# Patient Record
Sex: Female | Born: 1965
Health system: Southern US, Community
[De-identification: ages and names within clinical notes are randomized; demographics above are authoritative.]

## PROBLEM LIST (undated history)

## (undated) DIAGNOSIS — T7840XA Allergy, unspecified, initial encounter: Secondary | ICD-10-CM

## (undated) DIAGNOSIS — Z872 Personal history of diseases of the skin and subcutaneous tissue: Secondary | ICD-10-CM

## (undated) DIAGNOSIS — E785 Hyperlipidemia, unspecified: Secondary | ICD-10-CM

## (undated) DIAGNOSIS — E119 Type 2 diabetes mellitus without complications: Secondary | ICD-10-CM

## (undated) DIAGNOSIS — G4730 Sleep apnea, unspecified: Secondary | ICD-10-CM

## (undated) DIAGNOSIS — L930 Discoid lupus erythematosus: Secondary | ICD-10-CM

## (undated) DIAGNOSIS — E669 Obesity, unspecified: Secondary | ICD-10-CM

## (undated) DIAGNOSIS — R7303 Prediabetes: Secondary | ICD-10-CM

## (undated) DIAGNOSIS — R600 Localized edema: Secondary | ICD-10-CM

## (undated) DIAGNOSIS — Z800 Family history of malignant neoplasm of digestive organs: Secondary | ICD-10-CM

## (undated) DIAGNOSIS — T8859XA Other complications of anesthesia, initial encounter: Secondary | ICD-10-CM

## (undated) DIAGNOSIS — J342 Deviated nasal septum: Secondary | ICD-10-CM

## (undated) DIAGNOSIS — G2581 Restless legs syndrome: Secondary | ICD-10-CM

## (undated) DIAGNOSIS — Z8042 Family history of malignant neoplasm of prostate: Secondary | ICD-10-CM

## (undated) DIAGNOSIS — M549 Dorsalgia, unspecified: Secondary | ICD-10-CM

## (undated) DIAGNOSIS — Z862 Personal history of diseases of the blood and blood-forming organs and certain disorders involving the immune mechanism: Secondary | ICD-10-CM

## (undated) DIAGNOSIS — G4733 Obstructive sleep apnea (adult) (pediatric): Secondary | ICD-10-CM

## (undated) HISTORY — DX: Allergy, unspecified, initial encounter: T78.40XA

## (undated) HISTORY — DX: Personal history of diseases of the blood and blood-forming organs and certain disorders involving the immune mechanism: Z86.2

## (undated) HISTORY — DX: Type 2 diabetes mellitus without complications: E11.9

## (undated) HISTORY — DX: Prediabetes: R73.03

## (undated) HISTORY — DX: Obstructive sleep apnea (adult) (pediatric): G47.33

## (undated) HISTORY — DX: Family history of malignant neoplasm of prostate: Z80.42

## (undated) HISTORY — DX: Deviated nasal septum: J34.2

## (undated) HISTORY — DX: Dorsalgia, unspecified: M54.9

## (undated) HISTORY — DX: Restless legs syndrome: G25.81

## (undated) HISTORY — DX: Obesity, unspecified: E66.9

## (undated) HISTORY — DX: Hyperlipidemia, unspecified: E78.5

## (undated) HISTORY — DX: Family history of malignant neoplasm of digestive organs: Z80.0

## (undated) HISTORY — DX: Localized edema: R60.0

## (undated) HISTORY — PX: ROTATOR CUFF REPAIR: SHX139

## (undated) HISTORY — DX: Sleep apnea, unspecified: G47.30

## (undated) HISTORY — DX: Personal history of diseases of the skin and subcutaneous tissue: Z87.2

## (undated) HISTORY — DX: Discoid lupus erythematosus: L93.0

---

## 2005-08-08 ENCOUNTER — Emergency Department (HOSPITAL_COMMUNITY): Admission: EM | Admit: 2005-08-08 | Discharge: 2005-08-08 | Payer: Self-pay | Admitting: Family Medicine

## 2006-07-15 ENCOUNTER — Emergency Department (HOSPITAL_COMMUNITY): Admission: EM | Admit: 2006-07-15 | Discharge: 2006-07-15 | Payer: Self-pay | Admitting: Family Medicine

## 2006-10-02 ENCOUNTER — Emergency Department (HOSPITAL_COMMUNITY): Admission: EM | Admit: 2006-10-02 | Discharge: 2006-10-02 | Payer: Self-pay | Admitting: Family Medicine

## 2007-08-28 HISTORY — PX: ROTATOR CUFF REPAIR: SHX139

## 2008-03-27 HISTORY — PX: OTHER SURGICAL HISTORY: SHX169

## 2008-04-09 ENCOUNTER — Other Ambulatory Visit: Admission: RE | Admit: 2008-04-09 | Discharge: 2008-04-09 | Payer: Self-pay | Admitting: Internal Medicine

## 2008-04-29 ENCOUNTER — Ambulatory Visit (HOSPITAL_COMMUNITY): Admission: RE | Admit: 2008-04-29 | Discharge: 2008-04-29 | Payer: Self-pay | Admitting: Internal Medicine

## 2008-05-23 ENCOUNTER — Ambulatory Visit (HOSPITAL_COMMUNITY): Admission: RE | Admit: 2008-05-23 | Discharge: 2008-05-23 | Payer: Self-pay | Admitting: Orthopedic Surgery

## 2008-08-12 ENCOUNTER — Ambulatory Visit: Payer: Self-pay | Admitting: Internal Medicine

## 2008-09-14 ENCOUNTER — Ambulatory Visit (HOSPITAL_BASED_OUTPATIENT_CLINIC_OR_DEPARTMENT_OTHER): Admission: RE | Admit: 2008-09-14 | Discharge: 2008-09-15 | Payer: Self-pay | Admitting: Orthopedic Surgery

## 2008-11-22 ENCOUNTER — Ambulatory Visit: Payer: Self-pay | Admitting: Internal Medicine

## 2008-11-28 ENCOUNTER — Emergency Department (HOSPITAL_COMMUNITY): Admission: EM | Admit: 2008-11-28 | Discharge: 2008-11-28 | Payer: Self-pay | Admitting: Family Medicine

## 2009-04-14 ENCOUNTER — Other Ambulatory Visit: Admission: RE | Admit: 2009-04-14 | Discharge: 2009-04-14 | Payer: Self-pay | Admitting: Internal Medicine

## 2009-04-14 ENCOUNTER — Ambulatory Visit: Payer: Self-pay | Admitting: Internal Medicine

## 2009-09-26 ENCOUNTER — Ambulatory Visit (HOSPITAL_COMMUNITY): Admission: RE | Admit: 2009-09-26 | Discharge: 2009-09-26 | Payer: Self-pay | Admitting: Internal Medicine

## 2009-10-20 DIAGNOSIS — E785 Hyperlipidemia, unspecified: Secondary | ICD-10-CM | POA: Insufficient documentation

## 2009-10-20 DIAGNOSIS — E559 Vitamin D deficiency, unspecified: Secondary | ICD-10-CM | POA: Insufficient documentation

## 2009-10-20 DIAGNOSIS — G2581 Restless legs syndrome: Secondary | ICD-10-CM | POA: Insufficient documentation

## 2009-10-21 ENCOUNTER — Ambulatory Visit: Payer: Self-pay | Admitting: Pulmonary Disease

## 2009-10-21 DIAGNOSIS — J309 Allergic rhinitis, unspecified: Secondary | ICD-10-CM | POA: Insufficient documentation

## 2009-10-21 DIAGNOSIS — G4710 Hypersomnia, unspecified: Secondary | ICD-10-CM | POA: Insufficient documentation

## 2009-10-21 DIAGNOSIS — L930 Discoid lupus erythematosus: Secondary | ICD-10-CM | POA: Insufficient documentation

## 2009-10-28 ENCOUNTER — Ambulatory Visit: Payer: Self-pay | Admitting: Pulmonary Disease

## 2009-11-03 DIAGNOSIS — G4733 Obstructive sleep apnea (adult) (pediatric): Secondary | ICD-10-CM | POA: Insufficient documentation

## 2009-12-16 ENCOUNTER — Ambulatory Visit (HOSPITAL_BASED_OUTPATIENT_CLINIC_OR_DEPARTMENT_OTHER): Admission: RE | Admit: 2009-12-16 | Discharge: 2009-12-16 | Payer: Self-pay | Admitting: Pulmonary Disease

## 2009-12-16 ENCOUNTER — Encounter: Payer: Self-pay | Admitting: Pulmonary Disease

## 2009-12-24 ENCOUNTER — Ambulatory Visit: Payer: Self-pay | Admitting: Pulmonary Disease

## 2009-12-26 ENCOUNTER — Telehealth (INDEPENDENT_AMBULATORY_CARE_PROVIDER_SITE_OTHER): Payer: Self-pay | Admitting: *Deleted

## 2009-12-26 ENCOUNTER — Ambulatory Visit: Payer: Self-pay | Admitting: Pulmonary Disease

## 2010-04-20 ENCOUNTER — Ambulatory Visit: Payer: Self-pay | Admitting: Internal Medicine

## 2010-04-20 ENCOUNTER — Other Ambulatory Visit: Admission: RE | Admit: 2010-04-20 | Discharge: 2010-04-20 | Payer: Self-pay | Admitting: Internal Medicine

## 2010-09-18 ENCOUNTER — Encounter: Payer: Self-pay | Admitting: Orthopedic Surgery

## 2010-09-26 NOTE — Progress Notes (Signed)
----   Converted from flag ---- ---- 12/26/2009 9:16 AM, Arman Filter LPN wrote: pt needs ov with kc to discuss sleep study results. ------------------------------  called and spoke with pt.  pt scheduled to see [facility] today at 3:30pm.

## 2010-09-26 NOTE — Assessment & Plan Note (Signed)
Summary: ov to discuss sleep study results/mg   Copy to:  Marlan Palau Primary Provider/Referring Provider:  Marlan Palau  CC:  Pt is here for a f/u appt to discuss sleep study results. .  History of Present Illness: The pt comes in today for f/u of her recent sleep study.  She was found to have severe osa, with AHI of 44/hr and desat to 86%.  I have gone over the study in detail with her, and answered all of her questions.  Medications Prior to Update: 1)  Lipitor 10 Mg Tabs (Atorvastatin Calcium) .... Take 1 Tablet By Mouth Once A Day 2)  Allegra-D 24 Hour 180-240 Mg Xr24h-Tab (Fexofenadine-Pseudoephedrine) .... Take 1 Tablet By Mouth Once A Day 3)  Requip 0.5 Mg Tabs (Ropinirole Hcl) .... Take 1 To 2 Tabs By Mouth At Bedtime  Allergies (verified): 1)  ! Sulfa  Vital Signs:  Patient profile:   45 year old female Height:      59 inches Weight:      206.25 pounds BMI:     41.81 O2 Sat:      97 % on Room air Temp:     98.7 degrees F oral Pulse rate:   112 / minute BP sitting:   120 / 86  (left arm) Cuff size:   large  Vitals Entered By: Arman Filter LPN (Dec 26, 1189 3:35 PM)  O2 Flow:  Room air CC: Pt is here for a f/u appt to discuss sleep study results.  Comments Medications reviewed with patient Arman Filter LPN  Dec 26, 4780 3:35 PM    Physical Exam  General:  obese female in nad   Impression & Recommendations:  Problem # 1:  OBSTRUCTIVE SLEEP APNEA (ICD-327.23) the pt has severe osa by her recent sleep study, and will need aggressive treatment.  CPAP and weight loss are her best treatment options, and the pt is willing to try.  Will start her on a medium pressure level to allow for desensitization, and then will optimize with auto mode at home.  I have discussed with her the varioius mask choices, and also how to use the heated humidifier.  She will follow up in 4-5 weeks to check on her progress.  Time spent with pt today was .  Other Orders: Est.  Patient Level III (95621) DME Referral (DME)  Patient Instructions: 1)  will set up on cpap.  please call me if any tolerance issues 2)  work on weight loss 3)  followup with me in 5 weeks.   Immunization History:  Influenza Immunization History:    Influenza:  historical (05/27/2009)  Pneumovax Immunization History:    Pneumovax:  n/a (12/26/2009)

## 2010-09-26 NOTE — Assessment & Plan Note (Signed)
Summary: consult for hypersomnia   Copy to:  Marlan Palau Primary Provider/Referring Provider:  Marlan Palau  CC:  Sleep Consult.  History of Present Illness: The pt is a 45y/o female who I have been asked to see for possible osa.  The pt does not have a bed partner, but her family has noted "whistling" whenever she sleeps.  She does not think she snores, and denies choking arousals.  She goes to bed at 10:30 pm, and arises at 6:20am to start her day.   She is a restless sleeper, and does not feel refreshed the next day.  She may have decreased concentration and alertness at work, but denies sleepiness.  She denies dozing with tv or movies, but does feel she is abnormally sleepy on weekends if she does inactive things.  She also notes some sleep pressure with driving long distances.  Her weight is up 20 pounds over the last 2 years, but her epworth is only 2 today.  She does have a h/o RLS for which she takes requip, but feels she is not controlled even on medication.  She describes unusual sensations in her LE's during day and evening, but what is unusual is that it does not improve with movement/walking.  She does not know if she has kicking during the night.  She doesn't know if she has ever had an iron panel.    Preventive Screening-Counseling & Management  Alcohol-Tobacco     Smoking Status: quit  Current Medications (verified): 1)  Lipitor 10 Mg Tabs (Atorvastatin Calcium) .... Take 1 Tablet By Mouth Once A Day 2)  Allegra-D 24 Hour 180-240 Mg Xr24h-Tab (Fexofenadine-Pseudoephedrine) .... Take 1 Tablet By Mouth Once A Day 3)  Requip 0.5 Mg Tabs (Ropinirole Hcl) .... Take 1 To 2 Tabs By Mouth At Bedtime  Allergies (verified): 1)  ! 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 heart disease: father (bypass) clotting disorders: father (stroke) father: HTN, DM mother: hyperlipidemia 2 sisters: DM Osa in multiple family members.  Social History: Reviewed history and no changes required. Patient states former smoker.  started at age 53.  1 ppd.  quit 2005. pt is single. pt has no children. 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  Smoking Status:  quit  Review of Systems       The patient complains of nasal congestion/difficulty breathing through nose and hand/feet swelling.  The patient denies shortness of breath with activity, shortness of breath at rest, productive cough, non-productive cough, coughing up blood, chest pain, irregular heartbeats, acid heartburn, indigestion, loss of appetite, weight change, abdominal pain, difficulty swallowing, sore throat, tooth/dental problems, headaches, sneezing, itching, ear ache, anxiety, depression, joint stiffness or pain, rash, change in color of mucus, and fever.    Vital Signs:  Patient profile:   45 year old female Height:      59 inches Weight:      204.38 pounds BMI:     41.43 O2 Sat:      97 % on Room air Temp:     99.1 degrees F oral Pulse rate:   122 / minute BP sitting:   120 / 92  (left arm) Cuff size:   regular  Vitals Entered By: Arman Filter LPN (October 21, 2009 2:13 PM)  O2 Flow:  Room air CC: Sleep Consult Comments Medications reviewed with patient Arman Filter LPN  October 21, 2009 2:20 PM    Physical Exam  General:  ow female in nad Eyes:  PERRLA and EOMI.   Nose:  mild septal deviation Mouth:  moderate elongation of soft palate and uvula Neck:  no jvd, tmg, LN Lungs:  clear to auscultation Heart:  rrr, no mrg Abdomen:  soft and nontender, bs+ Extremities:  no edema noted, pulses intact distally no cyanosis Neurologic:  alert and oriented, moves all 4.   Impression &  Recommendations:  Problem # 1:  HYPERSOMNIA (ICD-780.54) the pt's history is somewhat suggestive of osa, but not overwhelmingly so.  She does have unrefreshed sleep at times, along with varying degrees of sleep pressure.  She is at risk for osa with her obesity, family history, and abnormal upper airway anatomy.  Part of the problem is she sleeps alone, with no one to witness abnormal breathing patterns or whether she may have excessive kicking.  It may be that her RLS? is affecting her sleep more than anything else, and she has minimal sleep disordered breathing?  At this point, because my suspicion for osa is not overly high, will do home screening study.  If she has very little sleep apnea, will look at treating RLS more aggessively.  Will see if Dr. Lenord Fellers has an iron panel on this patient, since deficiency can cause RLS.    Medications Added to Medication List This Visit: 1)  Allegra-d 24 Hour 180-240 Mg Xr24h-tab (Fexofenadine-pseudoephedrine) .... Take 1 tablet by mouth once a day  Other Orders: Consultation Level IV (23557) Misc. Referral (Misc. Ref)  Patient Instructions: 1)  will check screening study at home to evaluate for sleep apnea.  I will call with results 2)  work on weight loss.  Appended Document: consult for hypersomnia megan, please see if Dr. Lenord Fellers has an serum iron panel on this patient. thanks.  Appended Document: consult for hypersomnia called and spoke with Dr. Beryle Quant office.  No serum iron panel drawn on this pt per their records.   Appended Document: consult for hypersomnia noted.

## 2010-09-26 NOTE — Assessment & Plan Note (Signed)
Summary: apnea link shows severe osa   Copy to:  Marlan Palau Primary Provider/Referring Provider:  Marlan Palau   History of Present Illness: apnea link shows severe osa with AHI 47/hr, and desat to 74%.  the pt will need formal sleep testing for evaluation and treatment.  Allergies: 1)  ! Sulfa   Other Orders: No Charge Patient Arrived (NCPA0) (NCPA0) Sleep Disorder Referral (Sleep Disorder)  Patient Instructions: 1)  will schedule for formal sleep study

## 2010-10-31 ENCOUNTER — Ambulatory Visit (INDEPENDENT_AMBULATORY_CARE_PROVIDER_SITE_OTHER): Payer: Self-pay | Admitting: Internal Medicine

## 2010-10-31 DIAGNOSIS — J310 Chronic rhinitis: Secondary | ICD-10-CM

## 2010-10-31 DIAGNOSIS — E785 Hyperlipidemia, unspecified: Secondary | ICD-10-CM

## 2010-11-13 ENCOUNTER — Encounter: Payer: Self-pay | Admitting: *Deleted

## 2010-12-17 ENCOUNTER — Inpatient Hospital Stay (INDEPENDENT_AMBULATORY_CARE_PROVIDER_SITE_OTHER)
Admission: RE | Admit: 2010-12-17 | Discharge: 2010-12-17 | Disposition: A | Discharge status: Home / Self Care | Payer: 59 | Source: Ambulatory Visit | Attending: Family Medicine | Admitting: Family Medicine

## 2010-12-17 DIAGNOSIS — S91109A Unspecified open wound of unspecified toe(s) without damage to nail, initial encounter: Secondary | ICD-10-CM

## 2010-12-28 ENCOUNTER — Ambulatory Visit (INDEPENDENT_AMBULATORY_CARE_PROVIDER_SITE_OTHER): Payer: 59 | Admitting: Internal Medicine

## 2010-12-28 DIAGNOSIS — S91109A Unspecified open wound of unspecified toe(s) without damage to nail, initial encounter: Secondary | ICD-10-CM

## 2011-01-01 ENCOUNTER — Ambulatory Visit (INDEPENDENT_AMBULATORY_CARE_PROVIDER_SITE_OTHER): Payer: 59 | Admitting: Internal Medicine

## 2011-01-01 DIAGNOSIS — S91309A Unspecified open wound, unspecified foot, initial encounter: Secondary | ICD-10-CM

## 2011-01-09 NOTE — Op Note (Signed)
Connie [name], [name]              ACCOUNT NO.:  [id number]   MEDICAL RECORD NO.:  [id number]          PATIENT TYPE:  AMB   LOCATION:  DSC                          FACILITY:  [facility]   PHYSICIAN:  Katy Fitch. Sypher, M.D. 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   POSTOPERATIVE DIAGNOSIS:  Bursal surface tearing and ulceration of  infraspinatus tendon with calcific tendinopathy and chronic sub distal  clavicle impingement and a degenerative type 1 superior labrum anterior  and posterior lesion.   OPERATIONS:  1. Diagnostic arthroscopy, left shoulder identifying a degenerative      type 1 superior labrum anterior and posterior lesion.  2. Arthroscopic debridement of superior labrum anterior and posterior      lesion, adhesive capsulitis, granulation tissue, and synovitis.  3. Arthroscopic subacromial bursectomy, limited medial acromioplasty,      and debridement of calcific tendinopathy deposit of ulcerated      infraspinatus bursal surface tendon predicament.  4. Arthroscopic distal clavicle resection.   OPERATING SURGEON:  Katy Fitch. Sypher, MD   ASSISTANT:  Marveen Reeks Dasnoit, PA-C   ANESTHESIA:  General by LMA.   SUPERVISING ANESTHESIOLOGIST:  Janetta Hora. Gelene Mink, MD   INDICATIONS:  Connie [name] is a 45 year old woman referred for  evaluation and management of a painful left shoulder by her primary care  physician, Dr. Eden Emms Baxley.   Clinical examination revealed signs of impingement and possible rotator  cuff tendinopathy.  Plain x-rays did not reveal significant sclerosis of  greater tuberosity, however, a calcific deposit in the infraspinatus  tendon was noted on the internal rotation plain films.   An MRI of the  shoulder was obtained which documented AC arthropathy, an  ulcerated bursal side calcific deposit in the infraspinatus tendon, and  signs of some labral degeneration.   We recommended a trial of nonoperative measures including steroid  injection therapy and activity modification.  Due to a failed response  to these measures, she is now brought to the operating room anticipating  diagnostic arthroscopy of her shoulder, appropriate debridement,  subacromial decompression, distal clavicle resection, and debridement of  the calcific tendinopathy deposit.   After informed consent, she is brought to the operating room at this  time.   PROCEDURE IN DETAIL:  Connie [name] is brought to the operating room  and placed in supine position upon the operating table.   Following an anesthesia consult with Dr. Gelene Mink in the holding area,  general anesthesia by endotracheal technique was recommended and  accepted by Connie [name].   A left interscalene block was placed without complication.   She was brought to room 1, placed in supine position upon the operating  table, and under Dr. Thornton Dales direct supervision general endotracheal  anesthesia induced.   She was carefully positioned in a beach-chair position with aid of a  torso and head holder designed for shoulder arthroscopy.  The entire  left upper extremity forequarter was prepped with DuraPrep and draped  with impervious arthroscopy drapes.  The procedure commenced with  placement of the arthroscope through standard posterior viewing portal  after distention of the shoulder joint with 20 mL of sterile saline.  Easy instrumentation through a posterior viewing portal was accomplished  followed by diagnostic arthroscopy.  Diagnostic arthroscopy revealed a  degenerative type 1 SLAP lesion and a degenerative anterior labrum.  An  anterior portal was created under direct vision followed by debridement  of the free margin of the superior  labrum and careful inspection of the  deep surface of the biceps anchor.  There was a bit of a peel-back test  noted with some degeneration of the anchor of the superior labrum,  however, I could not displace the biceps tendon within the joint.  Given  her age and activity level, I recommended proceeding with debridement to  bleeding bone over the dorsal neck of the glenoid followed by  debridement of the edge of the labrum back without formal flap repair  out of concern that we would cause possible shoulder stiffness.   With a nerve hook I identified that the biceps tendon was not  displaceable into the joint and the superior labrum while somewhat lax  was more degenerative than truly unstable.  Therefore, the dorsum of the  glenoid neck was decorticated with a suction shaver from 1 o'clock  anteriorly to 10 o'clock posteriorly and should scar down to help  stabilize the biceps origin.   An anterior synovectomy was accomplished followed by debridement of the  bursal and synovial fibers over the subscapularis revealing an intact  tendon.  The deep surface of the supraspinatus and infraspinatus was  well visualized and found to be intact.  This was probed with a nerve  hook and likewise found to be intact on the articular side.  The hyaline  articular cartilage surfaces of the glenoid and humeral head were  normal.  After hemostasis was achieved, the arthroscopic equipment was  removed from the glenohumeral joint and placed in the subacromial space.  The bursa was hypertrophic and quite fibrotic.  An extensive bursectomy  was accomplished allowing visualization of the [facility] joint and  coracoacromial arch.  The infraspinatus had areas of inflammation  posteriorly with scarring consistent with tendinopathy.  After  bursectomy, I identified by palpation with a nerve hook and the suction  shaver an area of firm calcific tendinopathy.  This was ulcerated on its  bursal surface.  After the  superficial bursa was removed, I took the  nerve hook and repeatedly punched the calcific tendinopathy lesion  trying to knead out as much calcium as possible.   The calcium was definitely within the fibers of the infraspinatus.  Therefore, only a superficial layer of the tendon was debrided with the  suction shaver and will be allowed to regress spontaneously.   After completion of this debridement, the [facility] joint was inspected and  found to be quite prominent posteriorly.  Therefore, the capsule was  released with suction shaver followed by electrocautery and the distal  15 mm of clavicle was removed arthroscopically with a suction bur.  There were no apparent complications.   Connie [name] tolerated the surgery and anesthesia well.   After hemostasis was achieved, the portals were repaired with  intradermal 3-0 Prolene followed by application of sterile dressings and  a sling.  There were no apparent complications.   She was transferred to  the recovery room with stable vital signs.   We will see her back for followup in the office in 24-48 hours for a  dressing change, followed by initiation of a range of motion program.  For aftercare, she is provided prescription for Dilaudid 2 mg 1-2  tablets p.o. q.4-6 h. p.r.n. pain, 30 tablets without refill.  Also  Motrin 600 mg 1 p.o. q.6 h. p.r.n. pain, 30 tablets with 1 refill and  Keflex 500 mg 1 p.o. q.8 h. x4 days as a prophylactic antibiotic.      Katy Fitch Sypher, M.D.  Electronically Signed     RVS/MEDQ  D:  09/14/2008  T:  09/15/2008  Job:  [id number]   cc:   Luanna Cole. Lenord Fellers, M.D.

## 2011-03-13 ENCOUNTER — Other Ambulatory Visit: Payer: Self-pay | Admitting: *Deleted

## 2011-03-13 MED ORDER — ROPINIROLE HCL 0.5 MG PO TABS: 0.5000 mg | ORAL_TABLET | Freq: Every day | ORAL | Status: DC

## 2011-05-10 ENCOUNTER — Other Ambulatory Visit: Payer: Commercial Managed Care - PPO | Admitting: Internal Medicine

## 2011-05-10 DIAGNOSIS — E785 Hyperlipidemia, unspecified: Secondary | ICD-10-CM

## 2011-05-10 DIAGNOSIS — E119 Type 2 diabetes mellitus without complications: Secondary | ICD-10-CM

## 2011-05-10 LAB — HEMOGLOBIN A1C: Hgb A1c MFr Bld: 5.9 % — ABNORMAL HIGH (ref <5.7)

## 2011-05-10 LAB — LIPID PANEL
HDL: 35 mg/dL — ABNORMAL LOW (ref >39)
LDL Cholesterol: 91 mg/dL (ref 0–99)
Total CHOL/HDL Ratio: 4.2 Ratio
VLDL: 21 mg/dL (ref 0–40)

## 2011-05-10 LAB — HEPATIC FUNCTION PANEL
Albumin: 4.1 g/dL (ref 3.5–5.2)
Total Bilirubin: 0.4 mg/dL (ref 0.3–1.2)

## 2011-05-11 ENCOUNTER — Ambulatory Visit (INDEPENDENT_AMBULATORY_CARE_PROVIDER_SITE_OTHER): Payer: Commercial Managed Care - PPO | Admitting: Internal Medicine

## 2011-05-11 ENCOUNTER — Encounter: Payer: Self-pay | Admitting: Internal Medicine

## 2011-05-11 DIAGNOSIS — R609 Edema, unspecified: Secondary | ICD-10-CM

## 2011-05-11 DIAGNOSIS — E785 Hyperlipidemia, unspecified: Secondary | ICD-10-CM

## 2011-05-11 DIAGNOSIS — E669 Obesity, unspecified: Secondary | ICD-10-CM

## 2011-05-11 DIAGNOSIS — G5600 Carpal tunnel syndrome, unspecified upper limb: Secondary | ICD-10-CM | POA: Insufficient documentation

## 2011-05-11 DIAGNOSIS — E119 Type 2 diabetes mellitus without complications: Secondary | ICD-10-CM

## 2011-05-11 NOTE — Progress Notes (Signed)
  Subjective:    Patient ID: Connie Hall, female    DOB: 1966/03/01, 45 y.o.   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 They've tear from [place] around 2006. History of hyperlipidemia and adult onset diabetes mellitus. History of vitamin D deficiency. History of discoid lupus. History of dependent edema and restless leg syndrome. History of deviated nasal septum. 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 Had left shoulder arthroscopic subacromial decompression and calcium debridement by Dr. Teressa Senter 2010. History deviated nasal septum evaluated by ENT physician September 2011. Flonase nasal spray was recommended. She has not had any more complaints with this. She had a laceration of her right great toe may 2011 that has subsequently healed nicely. Has used Requip for restless leg syndrome . Has been managing diabetes by diet. Doesn't exercise very much. Is on statin therapy for hyperlipidemia. Had tetanus immunization 2012 in emergency department when she had laceration of her toe. Gets annual influenza immunization through the hospital. Last mammogram on file January 2011.       Review of Systems     Objective:   Physical Exam chest clear, cardiac exam regular rate and rhythm normal S1 and S2, extremities without edema.        Assessment & Plan:   Hyperlipidemia  Adult onset diabetes  Restless leg syndrome  Vitamin D deficiency  Dependent edema  Allergic rhinitis  Deviated nasal septum  History of right carpal tunnel syndrome  History of right medial epicondylitis  Status post removal of sebaceous cyst on back by Dr. Abbey Chatters August 2009.

## 2011-06-21 ENCOUNTER — Telehealth: Payer: Self-pay | Admitting: Internal Medicine

## 2011-06-22 NOTE — Telephone Encounter (Signed)
rx sent to pharmacy as requested

## 2011-09-06 ENCOUNTER — Ambulatory Visit (INDEPENDENT_AMBULATORY_CARE_PROVIDER_SITE_OTHER): Payer: Commercial Managed Care - PPO | Admitting: Internal Medicine

## 2011-09-06 ENCOUNTER — Encounter: Payer: Self-pay | Admitting: Internal Medicine

## 2011-09-06 VITALS — BP 128/80 | HR 88 | Temp 99.8°F | Ht <= 58 in | Wt 218.5 lb

## 2011-09-06 DIAGNOSIS — J329 Chronic sinusitis, unspecified: Secondary | ICD-10-CM

## 2011-10-27 ENCOUNTER — Encounter: Payer: Self-pay | Admitting: Internal Medicine

## 2011-10-27 NOTE — Patient Instructions (Signed)
Take antibiotics as prescribed. Call if not better in 10 days.

## 2011-10-30 NOTE — Progress Notes (Signed)
  Subjective:    Patient ID: Connie Hall, female    DOB: 01-Oct-1965, 46 y.o.   MRN: [id number]  HPI 46 year old white female with URI symptoms for several days. Has cough and congestion. Has malaise and fatigue. No fever or shaking chills. Maxillary sinus pressure.    Review of Systems     Objective:   Physical Exam HEENT exam: Pharynx slightly injected. TMs slightly full but not red. Neck is supple without adenopathy. Chest clear.        Assessment & Plan:  Sinusitis  Plan: Levaquin 500 milligrams daily for 10 days . Take with meals. Tessalon Perles 100 mg (#60) 2 by mouth 3 times a day when necessary cough.

## 2011-11-13 ENCOUNTER — Other Ambulatory Visit: Payer: Commercial Managed Care - PPO | Admitting: Internal Medicine

## 2011-11-13 DIAGNOSIS — E7800 Pure hypercholesterolemia, unspecified: Secondary | ICD-10-CM

## 2011-11-13 DIAGNOSIS — Z0000 Encounter for general adult medical examination without abnormal findings: Secondary | ICD-10-CM

## 2011-11-13 LAB — CBC WITH DIFFERENTIAL/PLATELET
Basophils Absolute: 0 10*3/uL (ref 0.0–0.1)
Eosinophils Absolute: 0.2 10*3/uL (ref 0.0–0.7)
Lymphocytes Relative: 23 % (ref 12–46)
Lymphs Abs: 2.2 10*3/uL (ref 0.7–4.0)
Neutrophils Relative %: 69 % (ref 43–77)
Platelets: 355 10*3/uL (ref 150–400)
RBC: 4.73 MIL/uL (ref 3.87–5.11)
WBC: 9.6 10*3/uL (ref 4.0–10.5)

## 2011-11-13 LAB — LIPID PANEL
Cholesterol: 166 mg/dL (ref 0–200)
LDL Cholesterol: 95 mg/dL (ref 0–99)
VLDL: 34 mg/dL (ref 0–40)

## 2011-11-13 LAB — COMPREHENSIVE METABOLIC PANEL
ALT: 12 U/L (ref 0–35)
AST: 13 U/L (ref 0–37)
CO2: 26 mEq/L (ref 19–32)
Sodium: 139 mEq/L (ref 135–145)
Total Bilirubin: 0.4 mg/dL (ref 0.3–1.2)
Total Protein: 7.2 g/dL (ref 6.0–8.3)

## 2011-11-13 LAB — TSH: TSH: 1.803 u[IU]/mL (ref 0.350–4.500)

## 2011-11-14 LAB — VITAMIN D 25 HYDROXY (VIT D DEFICIENCY, FRACTURES): Vit D, 25-Hydroxy: 66 ng/mL (ref 30–89)

## 2011-11-15 ENCOUNTER — Ambulatory Visit (INDEPENDENT_AMBULATORY_CARE_PROVIDER_SITE_OTHER): Payer: Commercial Managed Care - PPO | Admitting: Internal Medicine

## 2011-11-15 ENCOUNTER — Encounter: Payer: Self-pay | Admitting: Internal Medicine

## 2011-11-15 VITALS — BP 130/82 | HR 90 | Temp 98.7°F | Ht 59.0 in | Wt 217.0 lb

## 2011-11-15 DIAGNOSIS — E785 Hyperlipidemia, unspecified: Secondary | ICD-10-CM

## 2011-11-15 DIAGNOSIS — Z872 Personal history of diseases of the skin and subcutaneous tissue: Secondary | ICD-10-CM

## 2011-11-15 DIAGNOSIS — G2581 Restless legs syndrome: Secondary | ICD-10-CM

## 2011-11-15 DIAGNOSIS — Z862 Personal history of diseases of the blood and blood-forming organs and certain disorders involving the immune mechanism: Secondary | ICD-10-CM

## 2011-11-15 DIAGNOSIS — R609 Edema, unspecified: Secondary | ICD-10-CM

## 2011-11-15 DIAGNOSIS — Z0000 Encounter for general adult medical examination without abnormal findings: Secondary | ICD-10-CM

## 2011-11-15 DIAGNOSIS — E559 Vitamin D deficiency, unspecified: Secondary | ICD-10-CM

## 2011-11-15 DIAGNOSIS — E119 Type 2 diabetes mellitus without complications: Secondary | ICD-10-CM

## 2011-11-15 LAB — POCT URINALYSIS DIPSTICK
Bilirubin, UA: NEGATIVE
Blood, UA: NEGATIVE
Leukocytes, UA: NEGATIVE
Nitrite, UA: NEGATIVE
Protein, UA: NEGATIVE
Urobilinogen, UA: NEGATIVE
pH, UA: 6.5

## 2011-11-22 NOTE — Progress Notes (Signed)
Subjective:    Patient ID: Connie Hall, female    DOB: [birth year], 46 y.o.   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  Past medical history includes left shoulder arthropathy 2009, right medial epicondylitis August 2009, right carpal tunnel syndrome September 2009. Patient had a sebaceous cyst removed from her back by Dr. Abbey Chatters August 2009. She is allergic to sulfa causes times. Pain medications cause nausea. She gets annual influenza immunization through employment. Had tetanus immunization 2012 in the emergency department. This was result of a laceration to her right great toe when she stepped on 1010. Has seen Dr. 1 leaky for deviated nasal septum but has not had it repaired. Weight has been a problem for her. Saw Dr. claimants or sleep study and was found to have severe obstructive sleep apnea. Has CPAP machine.  Family history father age 73 with history of hypertension, stroke, coronary artery disease, kidney stones, and diabetes. Mother age 16 with history of osteoporosis. No brothers. 4 sisters 2 of whom have diabetes. 2 of them use CPAP machine for sleep apnea. 3 sisters are overweight. One sister is in overall good health.  Patient has a college degree. Is single never married. Does not smoke. Seldom consumes alcohol. She lives alone. Moved here from [place]. Family is in [place]. Has tried various diets without much success.    Review of Systems  Constitutional: Negative.   HENT: Negative.   Eyes: Negative.   Cardiovascular: Negative.   Gastrointestinal: Negative.   Musculoskeletal:       Some dependent edema in hot weather  Neurological: Negative.   Hematological: Negative.   Psychiatric/Behavioral:  Negative.        Objective:   Physical Exam  Vitals reviewed. Constitutional: She is oriented to person, place, and time. She appears well-developed and well-nourished. No distress.  HENT:  Head: Normocephalic and atraumatic.  Right Ear: External ear normal.  Left Ear: External ear normal.  Nose: Nose normal.  Mouth/Throat: Oropharynx is clear and moist. No oropharyngeal exudate.  Eyes: Conjunctivae and EOM are normal. Pupils are equal, round, and reactive to light. Right eye exhibits no discharge. Left eye exhibits no discharge. No scleral icterus.  Neck: Neck supple. No JVD present. No thyromegaly present.  Cardiovascular: Normal rate, regular rhythm, normal heart sounds and intact distal pulses.   No murmur heard. Pulmonary/Chest: Effort normal and breath sounds normal. She has no wheezes. She has no rales.  Abdominal: Soft. Bowel sounds are normal. She exhibits no distension and no mass. There is no tenderness. There is no rebound and no guarding.  Genitourinary: Uterus normal.       Pap deferred. Pap done 2011  Musculoskeletal: Normal range of motion. She exhibits no edema and no tenderness.  Lymphadenopathy:    She has no cervical adenopathy.  Neurological: She is alert and oriented to person, place, and time. She has normal reflexes. No cranial nerve deficit. Coordination normal.  Skin: Skin is warm and dry. No rash noted. She is not diaphoretic.  Psychiatric: She has a normal mood and affect. Her behavior is normal. Judgment and thought content normal.          Assessment & Plan:  Hyperlipidemia  Obesity  Type 2 diabetes mellitus  Restless leg syndrome  Obstructive sleep apnea  History of deviated nasal septum  History of dependent edema  History of discoid lupus  Plan: Return in 6 months for fasting lipid panel liver functions and hemoglobin A1c. Continue current medications. Try the diet and exercise. She's currently not on diabetic medication but could  consider metformin.

## 2011-11-24 ENCOUNTER — Encounter: Payer: Self-pay | Admitting: Internal Medicine

## 2011-11-24 NOTE — Patient Instructions (Signed)
Try the diet exercise and lose weight. Continue current medications as previously prescribed. These have been discussed with him today. Return in 6 months for office visit, blood pressure check, lipid panel, liver functions, hemoglobin A1c

## 2012-03-17 ENCOUNTER — Other Ambulatory Visit: Payer: Self-pay | Admitting: Internal Medicine

## 2012-05-13 ENCOUNTER — Other Ambulatory Visit: Payer: Commercial Managed Care - PPO | Admitting: Internal Medicine

## 2012-05-13 ENCOUNTER — Other Ambulatory Visit: Payer: Self-pay | Admitting: Internal Medicine

## 2012-05-13 DIAGNOSIS — Z79899 Other long term (current) drug therapy: Secondary | ICD-10-CM

## 2012-05-13 DIAGNOSIS — E785 Hyperlipidemia, unspecified: Secondary | ICD-10-CM

## 2012-05-13 LAB — HEPATIC FUNCTION PANEL
AST: 12 U/L (ref 0–37)
Alkaline Phosphatase: 47 U/L (ref 39–117)
Bilirubin, Direct: 0.1 mg/dL (ref 0.0–0.3)
Indirect Bilirubin: 0.2 mg/dL (ref 0.0–0.9)
Total Bilirubin: 0.3 mg/dL (ref 0.3–1.2)

## 2012-05-13 LAB — LIPID PANEL: LDL Cholesterol: 71 mg/dL (ref 0–99)

## 2012-05-15 ENCOUNTER — Encounter: Payer: Self-pay | Admitting: Internal Medicine

## 2012-05-15 ENCOUNTER — Ambulatory Visit (INDEPENDENT_AMBULATORY_CARE_PROVIDER_SITE_OTHER): Payer: Commercial Managed Care - PPO | Admitting: Internal Medicine

## 2012-05-15 VITALS — BP 122/90 | Temp 98.7°F | Ht 59.0 in | Wt 196.0 lb

## 2012-05-15 DIAGNOSIS — E669 Obesity, unspecified: Secondary | ICD-10-CM

## 2012-05-15 DIAGNOSIS — E119 Type 2 diabetes mellitus without complications: Secondary | ICD-10-CM

## 2012-05-15 DIAGNOSIS — E785 Hyperlipidemia, unspecified: Secondary | ICD-10-CM

## 2012-05-15 NOTE — Patient Instructions (Addendum)
Continue same meds and return in 6 months. Continue diet and weight loss. Get flu shot through [facility]

## 2012-05-26 ENCOUNTER — Encounter: Payer: Self-pay | Admitting: Internal Medicine

## 2012-05-26 NOTE — Progress Notes (Signed)
  Subjective:    Patient ID: Connie Hall, female    DOB: 01-Dec-1965, 46 y.o.   MRN: [id number]  HPI   46 year old white female with history of hypertension and type 2 diabetes mellitus in today for six-month recheck. Patient has been trying to lose weight with diet and exercise. By our records, she's lost 21 pounds in the past 6 months which is excellent. She is pleased with her progress and I am pleased as well.    Review of Systems     Objective:   Physical Exam: Neck is supple without JVD, thyromegaly or carotid bruits; chest is clear to auscultation without rales or wheezing; Cardiac exam: regular rate and rhythm normal S1 and S2; extremities without edema. Skin is warm and dry.        Assessment & Plan:  Hyperlipidemia -- lipid panel entirely within normal limits. Liver functions are normal.  Type 2 diabetes mellitus-hemoglobin A1c is excellent at 5.8%.  Obesity-21 pound weight loss past 6 months with diet and exercise  Plan: Return in 6 months for physical examination.

## 2012-07-01 ENCOUNTER — Other Ambulatory Visit: Payer: Self-pay | Admitting: Internal Medicine

## 2012-07-10 ENCOUNTER — Encounter: Payer: Self-pay | Admitting: Internal Medicine

## 2012-07-10 ENCOUNTER — Ambulatory Visit (INDEPENDENT_AMBULATORY_CARE_PROVIDER_SITE_OTHER): Payer: Commercial Managed Care - PPO | Admitting: Internal Medicine

## 2012-07-10 VITALS — BP 126/78 | Temp 98.9°F | Wt 206.0 lb

## 2012-07-10 DIAGNOSIS — L909 Atrophic disorder of skin, unspecified: Secondary | ICD-10-CM

## 2012-07-10 DIAGNOSIS — L919 Hypertrophic disorder of the skin, unspecified: Secondary | ICD-10-CM

## 2012-07-10 DIAGNOSIS — L918 Other hypertrophic disorders of the skin: Secondary | ICD-10-CM

## 2012-07-11 NOTE — Progress Notes (Signed)
  Subjective:    Patient ID: Connie Hall, female    DOB: June 26, 1966, 46 y.o.   MRN: [id number]  HPI In today for multiple skin tag removal from neck and chest.    Review of Systems     Objective:   Physical Exam Patient has multiple small skin tags bilateral neck and anterior chest which were removed today without local anesthesia at her request. Over 10 skin tags removed today. Patient tolerated procedure well. No complications. These are benign lesions and were not sent to pathology.       Assessment & Plan:  Skin tag removal  Plan: Patient is to clean them with peroxide twice daily and apply Polysporin ointment twice daily until healed

## 2012-07-11 NOTE — Patient Instructions (Addendum)
Clean lesions with peroxide twice daily and apply Polysporin ointment twice daily until healed

## 2012-10-17 ENCOUNTER — Other Ambulatory Visit: Payer: Self-pay | Admitting: Internal Medicine

## 2012-11-18 ENCOUNTER — Other Ambulatory Visit: Payer: Commercial Managed Care - PPO | Admitting: Internal Medicine

## 2012-11-20 ENCOUNTER — Encounter: Payer: Commercial Managed Care - PPO | Admitting: Internal Medicine

## 2012-12-30 ENCOUNTER — Other Ambulatory Visit: Payer: Commercial Managed Care - PPO | Admitting: Internal Medicine

## 2012-12-30 DIAGNOSIS — Z0000 Encounter for general adult medical examination without abnormal findings: Secondary | ICD-10-CM

## 2012-12-30 DIAGNOSIS — E119 Type 2 diabetes mellitus without complications: Secondary | ICD-10-CM

## 2012-12-30 LAB — CBC WITH DIFFERENTIAL/PLATELET
Eosinophils Absolute: 0.2 10*3/uL (ref 0.0–0.7)
Eosinophils Relative: 2 % (ref 0–5)
Hemoglobin: 11.6 g/dL — ABNORMAL LOW (ref 12.0–15.0)
Lymphocytes Relative: 23 % (ref 12–46)
Lymphs Abs: 2 10*3/uL (ref 0.7–4.0)
MCH: 27.8 pg (ref 26.0–34.0)
MCV: 86.3 fL (ref 78.0–100.0)
Monocytes Relative: 5 % (ref 3–12)
Platelets: 330 10*3/uL (ref 150–400)
RBC: 4.17 MIL/uL (ref 3.87–5.11)
WBC: 8.8 10*3/uL (ref 4.0–10.5)

## 2012-12-30 LAB — COMPREHENSIVE METABOLIC PANEL
ALT: 13 U/L (ref 0–35)
CO2: 22 mEq/L (ref 19–32)
Calcium: 8.8 mg/dL (ref 8.4–10.5)
Chloride: 105 mEq/L (ref 96–112)
Creat: 0.83 mg/dL (ref 0.50–1.10)
Glucose, Bld: 97 mg/dL (ref 70–99)
Sodium: 137 mEq/L (ref 135–145)
Total Bilirubin: 0.3 mg/dL (ref 0.3–1.2)
Total Protein: 7 g/dL (ref 6.0–8.3)

## 2012-12-30 LAB — LIPID PANEL
Cholesterol: 165 mg/dL (ref 0–200)
Triglycerides: 254 mg/dL — ABNORMAL HIGH (ref <150)

## 2012-12-30 LAB — HEMOGLOBIN A1C
Hgb A1c MFr Bld: 5.5 % (ref <5.7)
Mean Plasma Glucose: 111 mg/dL (ref <117)

## 2012-12-31 LAB — VITAMIN D 25 HYDROXY (VIT D DEFICIENCY, FRACTURES): Vit D, 25-Hydroxy: 39 ng/mL (ref 30–89)

## 2013-01-01 ENCOUNTER — Other Ambulatory Visit (HOSPITAL_COMMUNITY)
Admission: RE | Admit: 2013-01-01 | Discharge: 2013-01-01 | Disposition: A | Discharge status: Home / Self Care | Payer: 59 | Source: Ambulatory Visit | Attending: Internal Medicine | Admitting: Internal Medicine

## 2013-01-01 ENCOUNTER — Ambulatory Visit (INDEPENDENT_AMBULATORY_CARE_PROVIDER_SITE_OTHER): Payer: Commercial Managed Care - PPO | Admitting: Internal Medicine

## 2013-01-01 ENCOUNTER — Encounter: Payer: Self-pay | Admitting: Internal Medicine

## 2013-01-01 VITALS — BP 102/66 | HR 84 | Ht 59.0 in | Wt 230.0 lb

## 2013-01-01 DIAGNOSIS — Z01419 Encounter for gynecological examination (general) (routine) without abnormal findings: Secondary | ICD-10-CM | POA: Insufficient documentation

## 2013-01-01 DIAGNOSIS — Z8639 Personal history of other endocrine, nutritional and metabolic disease: Secondary | ICD-10-CM

## 2013-01-01 DIAGNOSIS — Z862 Personal history of diseases of the blood and blood-forming organs and certain disorders involving the immune mechanism: Secondary | ICD-10-CM

## 2013-01-01 DIAGNOSIS — E669 Obesity, unspecified: Secondary | ICD-10-CM

## 2013-01-01 DIAGNOSIS — R609 Edema, unspecified: Secondary | ICD-10-CM

## 2013-01-01 DIAGNOSIS — E785 Hyperlipidemia, unspecified: Secondary | ICD-10-CM

## 2013-01-01 DIAGNOSIS — E119 Type 2 diabetes mellitus without complications: Secondary | ICD-10-CM

## 2013-01-01 DIAGNOSIS — Z0000 Encounter for general adult medical examination without abnormal findings: Secondary | ICD-10-CM

## 2013-01-01 DIAGNOSIS — G2581 Restless legs syndrome: Secondary | ICD-10-CM

## 2013-01-01 DIAGNOSIS — Z872 Personal history of diseases of the skin and subcutaneous tissue: Secondary | ICD-10-CM

## 2013-01-01 LAB — POCT URINALYSIS DIPSTICK
Glucose, UA: NEGATIVE
Leukocytes, UA: NEGATIVE
Nitrite, UA: NEGATIVE
Protein, UA: NEGATIVE
Urobilinogen, UA: NEGATIVE

## 2013-01-05 ENCOUNTER — Other Ambulatory Visit: Payer: Self-pay

## 2013-01-05 DIAGNOSIS — Z1231 Encounter for screening mammogram for malignant neoplasm of breast: Secondary | ICD-10-CM

## 2013-03-03 ENCOUNTER — Ambulatory Visit
Admission: RE | Admit: 2013-03-03 | Discharge: 2013-03-03 | Disposition: A | Discharge status: Home / Self Care | Payer: 59 | Source: Ambulatory Visit

## 2013-03-03 DIAGNOSIS — Z1231 Encounter for screening mammogram for malignant neoplasm of breast: Secondary | ICD-10-CM

## 2013-03-30 ENCOUNTER — Ambulatory Visit: Payer: 59 | Admitting: Family Medicine

## 2013-04-02 ENCOUNTER — Other Ambulatory Visit: Payer: Commercial Managed Care - PPO | Admitting: Internal Medicine

## 2013-04-03 ENCOUNTER — Other Ambulatory Visit: Payer: Self-pay

## 2013-04-03 ENCOUNTER — Other Ambulatory Visit: Payer: Commercial Managed Care - PPO | Admitting: Internal Medicine

## 2013-04-03 MED ORDER — ROPINIROLE HCL 0.5 MG PO TABS: 0.5000 mg | ORAL_TABLET | Freq: Every day | ORAL | Status: DC

## 2013-04-03 MED ORDER — ATORVASTATIN CALCIUM 10 MG PO TABS: 10.0000 mg | ORAL_TABLET | Freq: Every day | ORAL | Status: DC

## 2013-04-09 ENCOUNTER — Other Ambulatory Visit: Payer: Commercial Managed Care - PPO | Admitting: Internal Medicine

## 2013-04-09 DIAGNOSIS — Z79899 Other long term (current) drug therapy: Secondary | ICD-10-CM

## 2013-04-09 DIAGNOSIS — E785 Hyperlipidemia, unspecified: Secondary | ICD-10-CM

## 2013-04-09 LAB — LIPID PANEL
Cholesterol: 154 mg/dL (ref 0–200)
Total CHOL/HDL Ratio: 4.5 Ratio

## 2013-04-09 LAB — HEPATIC FUNCTION PANEL
ALT: 12 U/L (ref 0–35)
AST: 11 U/L (ref 0–37)
Alkaline Phosphatase: 46 U/L (ref 39–117)
Bilirubin, Direct: 0.1 mg/dL (ref 0.0–0.3)
Total Bilirubin: 0.3 mg/dL (ref 0.3–1.2)

## 2013-04-10 ENCOUNTER — Ambulatory Visit (INDEPENDENT_AMBULATORY_CARE_PROVIDER_SITE_OTHER): Payer: 59 | Admitting: Family Medicine

## 2013-04-10 VITALS — BP 122/80 | Ht 59.0 in | Wt 210.0 lb

## 2013-04-10 DIAGNOSIS — M25569 Pain in unspecified knee: Secondary | ICD-10-CM

## 2013-04-10 DIAGNOSIS — M25561 Pain in right knee: Secondary | ICD-10-CM

## 2013-04-10 NOTE — Progress Notes (Signed)
CC: Bilateral Knee Pain, L>R  HPI: Patient presents with bilateral knee pain. Larey Seat and had acute pain with her right knee and this has improved. Fell in parking lot and fell on knee. Initially felt like something was broken when it happened 2 months ago. Pain has improved, just have some intermittent burning and searing pain. Was very painful to the touch over the patella. Had small cut and bruise after fall.    Left knee has bothered her for 2 years. Patient fell roller skating as a young girl and she thinks that this caused her problem. Has to go down stairs one at a time. No weakness. Has trouble going down stairs due to pain. Feels like she may not make it safely if she puts all her weight on it. Doesn't trust the knee, feels like she would fall. Not taking any meds. No locking, catching, giving out. No swelling. No night pain or walking pain. Pain is anterior knee.   ROS: As above in the HPI. All other systems are stable or negative.  PMH: Dyslipidemia, restless leg syndrome , allergic rhinitis  Social: Patient is the Teacher, English as a foreign language at [employer]. She does not smoke. She previously smoked for 8 years. She drinks alcohol about 4 times per year. Family: Family history is positive for diabetes in dad and 3 sisters, heart disease in dad, and high blood pressure in dad  Allergies: Sulfa    OBJECTIVE: APPEARANCE:  Patient in no acute distress.The patient appeared well nourished and normally developed. HEENT: No scleral icterus. Conjunctiva non-injected Resp: Non labored Skin: No rash MSK:  Left Knee - Inspection normal with no erythema or effusion or obvious bony abnormalities.  - Palpation normal with no warmth or joint line tenderness. No TTP at patellar or quad tendon. Significant crepitus on passive movement - ROM normal in flexion and extension. - Strength 5/5 in flexion and extension. - Ligaments with solid consistent endpoints including ACL, PCL, LCL, MCL.  - Negative Mcmurray's.  - Non  painful patellar compression.  - Neurovascularly intact Right Knee - Inspection normal with no erythema or effusion or obvious bony abnormalities.  - Palpation normal with no warmth or joint line tenderness. No TTP at patellar or quad tendon. Patient does have mild tenderness to palpation at the superior medial corner of the patella - ROM normal in flexion and extension. - Strength 5/5 in flexion and extension. - Ligaments with solid consistent endpoints including ACL, PCL, LCL, MCL.  - Negative Mcmurray's.  - Non painful patellar compression.  - Neurovascularly intact   ASSESSMENT: #1. Left knee pain due to to patellofemoral syndrome #2. Right knee pain, improving, likely prepatellar bursitis versus periostial reaction from trauma   PLAN: We offered patient reassurance about her right knee pain and expected it will continue to improve after her injury 2 months ago. For her left knee, explained the natural course and benign nature of patellofemoral pain. Explain the importance of VMO strengthening. Patient was given home exercises to do including straight leg raise, quad sets, wall scar. We recommended that she use a stationary bicycle with low resistance and high rpm's. Also recommended working towards a healthier weight. Patient will take over-the-counter ibuprofen 600 mg 3 times per week for the next 2 weeks. We will see her back in one month.   [facility]: Attending Note: I have reviewed the chart, discussed wit the Sports Medicine Fellow. I agree with assessment and treatment plan as detailed in the Fellow's note.

## 2013-04-10 NOTE — Patient Instructions (Addendum)
Thank you for coming in today  I think that your right knee will continue to get better. Try the anti-inflammatory medication and ice over the kneecap  For your left knee, you have patellofemoral pain syndrome We treat this by strengthening your quad muscle Take anti-inflammatory medication for 2 weeks and then as needed. 3 ibuprofen 3 x per day Work on getting to a healthier weight

## 2013-05-22 ENCOUNTER — Ambulatory Visit: Payer: 59 | Admitting: Family Medicine

## 2013-05-30 NOTE — Patient Instructions (Addendum)
Try the diet exercise and lose weight. Continue same medications. Return in 6 months.

## 2013-05-30 NOTE — Progress Notes (Signed)
Subjective:    Patient ID: Connie Hall, female    DOB: 1966-03-06, 47 y.o.   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  Past medical history: Left shoulder arthropathy 2009, right medial epicondylitis August 2009, right carpal tunnel syndrome September 2009. Patient had sebaceous cyst removed from her back by Dr. Abbey Chatters August 2009. History of obstructive sleep apnea and has seen Dr. Shelle Iron. Has CPAP machine.  She is allergic to sulfa it causes hives. Pain medications cause nausea.  Had tetanus immunization in 2012 in the emergency department because of laceration to right great toe when she stepped on a piece of tin.  History of deviated nasal septum and has seen ENT physician but has not had it repaired.  Gets annual influenza immunization through employment.  Weight has been a problem for her. Unable to lose weight.  Social history: Patient has college degree. Is single, never married. Does not smoke. Seldom consumes alcohol. Lives alone. Moved here from [place]. Family is in [place]. She's tried various diets without much success.  Family history: Father  with history of hypertension, stroke, coronary artery disease, kidney stones and diabetes. Mother with history of osteoporosis. No brothers. 4 sisters:  2 of them have diabetes. 2 sisters use CPAP machine for sleep apnea. 3 sisters are overweight. One sister is in overall good health.    Review of Systems  Constitutional: Negative.   Respiratory:       History of sleep apnea and uses CPAP  Neurological:       History of restless leg syndrome  All other systems reviewed and are negative.       Objective:   Physical Exam  Vitals reviewed. Constitutional: She is oriented to person, place, and time. She appears well-developed and well-nourished. No distress.  HENT:  Head: Normocephalic and atraumatic.  Right Ear: External ear normal.  Left Ear: External ear normal.  Mouth/Throat: Oropharynx is clear and moist. No oropharyngeal exudate.  Eyes: Conjunctivae and EOM are normal. Pupils are equal, round, and reactive to light. Right eye exhibits no discharge. Left eye exhibits no discharge. No scleral icterus.  Neck: Neck supple. No JVD present. No thyromegaly present.  Cardiovascular: Normal rate, regular rhythm, normal heart sounds and intact distal pulses.   No murmur heard. Pulmonary/Chest: Effort normal and breath sounds normal. No respiratory distress. She has no wheezes. She has no rales. She exhibits no tenderness.  Normal breast without masses  Abdominal: Soft. Bowel sounds are normal. She exhibits no mass. There is no tenderness. There is no rebound and no guarding.  Genitourinary: Vagina normal and uterus normal.  Musculoskeletal: Normal range of motion. She exhibits no edema.  Lymphadenopathy:    She has no cervical adenopathy.  Neurological: She is alert and oriented to person, place, and time. She has normal reflexes. No cranial nerve deficit. Coordination normal.  Skin: Skin is warm and dry. She is not diaphoretic.  Psychiatric: She has a normal mood and affect. Her behavior is normal. Judgment and thought content normal.          Assessment & Plan:  Hyperlipidemia-elevated triglycerides. Needs to work on that.  Restless leg syndrome-treated with medication  History of vitamin D deficiency-remind take vitamin D supplementation  History of  type 2 diabetes mellitus-A1c stable and under good control  History of discoid lupus-stable  Obesity  Metabolic syndrome  Plan: Try to diet exercise and lose weight. Return in 6 months. Will get annual influenza immunization through  employment.

## 2013-07-03 ENCOUNTER — Other Ambulatory Visit: Payer: Self-pay | Admitting: Internal Medicine

## 2013-07-03 ENCOUNTER — Other Ambulatory Visit: Payer: Commercial Managed Care - PPO | Admitting: Internal Medicine

## 2013-07-03 DIAGNOSIS — E785 Hyperlipidemia, unspecified: Secondary | ICD-10-CM

## 2013-07-03 DIAGNOSIS — Z79899 Other long term (current) drug therapy: Secondary | ICD-10-CM

## 2013-07-03 LAB — LIPID PANEL
Cholesterol: 142 mg/dL (ref 0–200)
HDL: 32 mg/dL — ABNORMAL LOW (ref >39)
LDL Cholesterol: 79 mg/dL (ref 0–99)
Total CHOL/HDL Ratio: 4.4 Ratio
Triglycerides: 156 mg/dL — ABNORMAL HIGH (ref <150)
VLDL: 31 mg/dL (ref 0–40)

## 2013-07-03 LAB — HEPATIC FUNCTION PANEL
ALT: 12 U/L (ref 0–35)
AST: 11 U/L (ref 0–37)
Albumin: 3.9 g/dL (ref 3.5–5.2)
Alkaline Phosphatase: 47 U/L (ref 39–117)
Bilirubin, Direct: 0.1 mg/dL (ref 0.0–0.3)
Indirect Bilirubin: 0.3 mg/dL (ref 0.0–0.9)
Total Bilirubin: 0.4 mg/dL (ref 0.3–1.2)
Total Protein: 6.7 g/dL (ref 6.0–8.3)

## 2013-07-09 ENCOUNTER — Ambulatory Visit (INDEPENDENT_AMBULATORY_CARE_PROVIDER_SITE_OTHER): Payer: Commercial Managed Care - PPO | Admitting: Internal Medicine

## 2013-07-09 ENCOUNTER — Encounter: Payer: Self-pay | Admitting: Internal Medicine

## 2013-07-09 VITALS — BP 133/86 | HR 88 | Temp 99.0°F | Ht 59.0 in | Wt 232.0 lb

## 2013-07-09 DIAGNOSIS — G2581 Restless legs syndrome: Secondary | ICD-10-CM

## 2013-07-09 LAB — HEMOGLOBIN A1C
Hgb A1c MFr Bld: 5.9 % — ABNORMAL HIGH (ref <5.7)
Mean Plasma Glucose: 123 mg/dL — ABNORMAL HIGH (ref <117)

## 2013-07-09 LAB — IRON AND TIBC
%SAT: 19 % — ABNORMAL LOW (ref 20–55)
Iron: 64 ug/dL (ref 42–145)
TIBC: 338 ug/dL (ref 250–470)
UIBC: 274 ug/dL (ref 125–400)

## 2013-07-09 MED ORDER — ROPINIROLE HCL 0.5 MG PO TABS: ORAL_TABLET | ORAL | Status: DC

## 2013-07-12 NOTE — Patient Instructions (Signed)
Please try to diet and exercise. Call for appointment for [facility] dietary counseling. Return in 6 months.

## 2013-07-12 NOTE — Progress Notes (Signed)
  Subjective:    Patient ID: Connie Hall, female    DOB: [birth year], 47 y.o.   MRN: [id number]  HPI Six-month recheck on diet controlled diabetes mellitus and hyperlipidemia controlled with statin medication. History of allergic rhinitis treated with Flonase. History of restless leg syndrome. Sometimes has taken extra Requip tablet in order to control restless leg syndrome. Says she simply can't keep her legs still particularly when sitting at her desk for prolonged periods of time. Patient says she has been unable to lose weight. Simply hasn't had the discipline to diet. Has gained 2 pounds since May 2014. Blood pressure is stable. No history of hypertension.    Review of Systems     Objective:   Physical Exam Skin warm and dry, nodes none, no thyromegaly, JVD, carotid bruits chest clear to auscultation, cardiac exam regular rate and rhythm normal S1 and S2, extremities without edema.       Assessment & Plan:  History of restless leg syndrome-patient may increase Requip to 3 tablets daily  Obesity-Will go to [facility] for dietary counseling  Diet controlled diabetes mellitus-hemoglobin A1c 5.9% and previously was 5.5%  Hyperlipidemia-treated with statin therapy. Triglycerides remain high at 224  History of allergic rhinitis-treated with Flonase nasal spray  Plan: Patient will return in 6 months for physical examination.

## 2013-07-14 ENCOUNTER — Encounter: Payer: Self-pay | Admitting: Internal Medicine

## 2013-09-21 ENCOUNTER — Other Ambulatory Visit: Payer: Self-pay

## 2013-09-21 MED ORDER — ATORVASTATIN CALCIUM 10 MG PO TABS: 10.0000 mg | ORAL_TABLET | Freq: Every day | ORAL | Status: DC

## 2013-10-02 ENCOUNTER — Other Ambulatory Visit: Payer: Self-pay | Admitting: Internal Medicine

## 2013-11-23 ENCOUNTER — Other Ambulatory Visit: Payer: Self-pay | Admitting: Internal Medicine

## 2014-01-05 ENCOUNTER — Other Ambulatory Visit: Payer: Commercial Managed Care - PPO | Admitting: Internal Medicine

## 2014-01-05 DIAGNOSIS — Z1329 Encounter for screening for other suspected endocrine disorder: Secondary | ICD-10-CM

## 2014-01-05 DIAGNOSIS — E559 Vitamin D deficiency, unspecified: Secondary | ICD-10-CM

## 2014-01-05 DIAGNOSIS — Z79899 Other long term (current) drug therapy: Secondary | ICD-10-CM

## 2014-01-05 DIAGNOSIS — E119 Type 2 diabetes mellitus without complications: Secondary | ICD-10-CM

## 2014-01-05 DIAGNOSIS — E785 Hyperlipidemia, unspecified: Secondary | ICD-10-CM

## 2014-01-05 DIAGNOSIS — Z130 Encounter for screening for diseases of the blood and blood-forming organs and certain disorders involving the immune mechanism: Secondary | ICD-10-CM

## 2014-01-05 LAB — CBC WITH DIFFERENTIAL/PLATELET
Basophils Absolute: 0.1 10*3/uL (ref 0.0–0.1)
Basophils Relative: 1 % (ref 0–1)
Eosinophils Absolute: 0.2 10*3/uL (ref 0.0–0.7)
Eosinophils Relative: 2 % (ref 0–5)
HCT: 40 % (ref 36.0–46.0)
Hemoglobin: 13.3 g/dL (ref 12.0–15.0)
LYMPHS ABS: 1.8 10*3/uL (ref 0.7–4.0)
LYMPHS PCT: 22 % (ref 12–46)
MCH: 28.3 pg (ref 26.0–34.0)
MCHC: 33.3 g/dL (ref 30.0–36.0)
MCV: 85.1 fL (ref 78.0–100.0)
Monocytes Absolute: 0.4 10*3/uL (ref 0.1–1.0)
Monocytes Relative: 5 % (ref 3–12)
NEUTROS PCT: 70 % (ref 43–77)
Neutro Abs: 5.6 10*3/uL (ref 1.7–7.7)
PLATELETS: 333 10*3/uL (ref 150–400)
RBC: 4.7 MIL/uL (ref 3.87–5.11)
RDW: 13.9 % (ref 11.5–15.5)
WBC: 8 10*3/uL (ref 4.0–10.5)

## 2014-01-05 LAB — LIPID PANEL
CHOL/HDL RATIO: 4.9 ratio
Cholesterol: 148 mg/dL (ref 0–200)
HDL: 30 mg/dL — ABNORMAL LOW (ref >39)
LDL CALC: 77 mg/dL (ref 0–99)
TRIGLYCERIDES: 203 mg/dL — AB (ref <150)
VLDL: 41 mg/dL — AB (ref 0–40)

## 2014-01-05 LAB — COMPREHENSIVE METABOLIC PANEL
ALT: 13 U/L (ref 0–35)
AST: 13 U/L (ref 0–37)
Albumin: 3.9 g/dL (ref 3.5–5.2)
Alkaline Phosphatase: 51 U/L (ref 39–117)
BUN: 11 mg/dL (ref 6–23)
CHLORIDE: 103 meq/L (ref 96–112)
CO2: 25 meq/L (ref 19–32)
Calcium: 9.4 mg/dL (ref 8.4–10.5)
Creat: 0.92 mg/dL (ref 0.50–1.10)
Glucose, Bld: 99 mg/dL (ref 70–99)
POTASSIUM: 5 meq/L (ref 3.5–5.3)
SODIUM: 136 meq/L (ref 135–145)
Total Bilirubin: 0.5 mg/dL (ref 0.2–1.2)
Total Protein: 7.1 g/dL (ref 6.0–8.3)

## 2014-01-05 LAB — HEMOGLOBIN A1C
Hgb A1c MFr Bld: 6 % — ABNORMAL HIGH (ref <5.7)
MEAN PLASMA GLUCOSE: 126 mg/dL — AB (ref <117)

## 2014-01-06 LAB — TSH: TSH: 1.994 u[IU]/mL (ref 0.350–4.500)

## 2014-01-06 LAB — VITAMIN D 25 HYDROXY (VIT D DEFICIENCY, FRACTURES): Vit D, 25-Hydroxy: 54 ng/mL (ref 30–89)

## 2014-01-07 ENCOUNTER — Encounter: Payer: Self-pay | Admitting: Internal Medicine

## 2014-01-07 ENCOUNTER — Ambulatory Visit (INDEPENDENT_AMBULATORY_CARE_PROVIDER_SITE_OTHER): Payer: Commercial Managed Care - PPO | Admitting: Internal Medicine

## 2014-01-07 VITALS — BP 132/84 | HR 84 | Temp 98.4°F | Ht 59.0 in | Wt 226.0 lb

## 2014-01-07 DIAGNOSIS — Z8639 Personal history of other endocrine, nutritional and metabolic disease: Secondary | ICD-10-CM

## 2014-01-07 DIAGNOSIS — B353 Tinea pedis: Secondary | ICD-10-CM

## 2014-01-07 DIAGNOSIS — E669 Obesity, unspecified: Secondary | ICD-10-CM

## 2014-01-07 DIAGNOSIS — G2581 Restless legs syndrome: Secondary | ICD-10-CM

## 2014-01-07 DIAGNOSIS — Z0000 Encounter for general adult medical examination without abnormal findings: Secondary | ICD-10-CM

## 2014-01-07 DIAGNOSIS — E119 Type 2 diabetes mellitus without complications: Secondary | ICD-10-CM

## 2014-01-07 DIAGNOSIS — G4733 Obstructive sleep apnea (adult) (pediatric): Secondary | ICD-10-CM

## 2014-01-07 DIAGNOSIS — E785 Hyperlipidemia, unspecified: Secondary | ICD-10-CM

## 2014-01-07 LAB — POCT URINALYSIS DIPSTICK
Bilirubin, UA: NEGATIVE
Blood, UA: NEGATIVE
GLUCOSE UA: NEGATIVE
Ketones, UA: NEGATIVE
Leukocytes, UA: NEGATIVE
NITRITE UA: NEGATIVE
Protein, UA: NEGATIVE
Urobilinogen, UA: NEGATIVE
pH, UA: 6

## 2014-01-07 MED ORDER — ECONAZOLE NITRATE 1 % EX CREA: TOPICAL_CREAM | Freq: Every day | CUTANEOUS | Status: DC

## 2014-01-07 NOTE — Patient Instructions (Signed)
Continue same meds and return in 6 months 

## 2014-05-02 ENCOUNTER — Encounter: Payer: Self-pay | Admitting: Internal Medicine

## 2014-05-02 NOTE — Progress Notes (Signed)
Subjective:    Patient ID: Connie Hall, female    DOB: 1965/12/31, 48 y.o.   MRN: [id number]  HPI 48 year old White Female in today for health maintenance exam and evaluation of medical issues. She has a history of dependent edema, obesity, discoid lupus, restless leg syndrome, vitamin D deficiency, hyperlipidemia, type 2 diabetes mellitus, metabolic syndrome, deviated nasal septum.  She is allergic to Sulfa- it causes hives. Pain medications cause nausea.  Past medical history: Left shoulder arthropathy 2009, right medial epicondylitis August 2009, right carpal tunnel syndrome September 2009. Patient has sebaceous cyst removed from her back by Dr. Zella Richer August 2009. History of obstructive sleep apnea and has seen Dr. Gwenette Greet. Has CPAP machine.  Had tetanus immunization in 2012 in the emergency department because of laceration to right great toe when she stepped on a piece of TM.  History of deviated nasal septum. Has seen ENT physician but has not had it repaired.  Gets influenza immunization through employment.  Weight is been a problem for her in unable to lose weight. Has tried various diets and exercise regimens.   Social history: She has a Gaffer. Is single, never married. Does not smoke. Seldom consumes alcohol. Lives alone. Moved here from [place]. Family is in [place]. 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  Family history: Father with history of hypertension, stroke, coronary artery disease, kidney stones and diabetes. Mother with history of osteoporosis. No brothers. 4 sisters 2 of whom have diabetes. 2 sisters have sleep apnea. 3 sisters are overweight. One sister is in overall good health.    Review of Systems  Constitutional: Negative.   HENT:       History of deviated nasal septum  Eyes: Negative.   Respiratory:       History of sleep apnea  Cardiovascular: Negative for chest pain and palpitations.   Gastrointestinal: Negative.   Genitourinary: Negative.   Neurological:       History of restless leg syndrome  Psychiatric/Behavioral: Negative.        Objective:   Physical Exam  Vitals reviewed. Constitutional: She is oriented to person, place, and time. She appears well-developed and well-nourished. No distress.  HENT:  Head: Normocephalic and atraumatic.  Right Ear: External ear normal.  Left Ear: External ear normal.  Mouth/Throat: Oropharynx is clear and moist. No oropharyngeal exudate.  Eyes: Conjunctivae and EOM are normal. Pupils are equal, round, and reactive to light. Right eye exhibits no discharge. Left eye exhibits no discharge. No scleral icterus.  Neck: Neck supple. No JVD present. No thyromegaly present.  Cardiovascular: Normal rate, regular rhythm, normal heart sounds and intact distal pulses.   No murmur heard. Pulmonary/Chest: Effort normal and breath sounds normal. No respiratory distress. She has no wheezes. She has no rales. She exhibits no tenderness.  Breasts normal female  Abdominal: Soft. Bowel sounds are normal. She exhibits no distension and no mass. There is no tenderness. There is no rebound and no guarding.  Genitourinary:  Pap done 2014 bimanual normal  Musculoskeletal: Normal range of motion. She exhibits no edema.  Lymphadenopathy:    She has no cervical adenopathy.  Neurological: She is alert and oriented to person, place, and time. She has normal reflexes. She displays normal reflexes. No cranial nerve deficit. Coordination normal.  Skin: Skin is warm and dry. No rash noted. She is not diaphoretic.  Psychiatric: She has a normal mood and affect. Her behavior is normal. Judgment and  thought content normal.   Tinea pedis left foot       Assessment & Plan:  History of sleep apnea  History of restless leg syndrome  Dependent edema  History of discoid lupus  History of vitamin D deficiency  Type 2 diabetes  mellitus  Hyperlipidemia  Metabolic syndrome  Tinea pedis left foot-prescribed Spectazole cream  Plan: Continue to encourage diet exercise and weight loss. Return in 6 months. Gets annual influenza immunization through employment. She will continue Lipitor, Requip vitamin D, Flonase nasal spray . Needs to diet exercise and lose weight. Type 2 diabetes is controlled at the present time.

## 2014-05-06 ENCOUNTER — Telehealth: Payer: Self-pay | Admitting: Pulmonary Disease

## 2014-05-06 NOTE — Telephone Encounter (Signed)
Pt last last seen 12/2009.  Called pt. She scheduled consult appt to see [facility] in October. Aware we will see her then. Nothing further needed

## 2014-06-11 ENCOUNTER — Other Ambulatory Visit: Payer: Self-pay

## 2014-06-14 ENCOUNTER — Encounter: Payer: Self-pay | Admitting: Pulmonary Disease

## 2014-06-14 ENCOUNTER — Ambulatory Visit (INDEPENDENT_AMBULATORY_CARE_PROVIDER_SITE_OTHER): Payer: 59 | Admitting: Pulmonary Disease

## 2014-06-14 VITALS — BP 124/62 | HR 90 | Temp 98.8°F | Ht 59.0 in | Wt 231.0 lb

## 2014-06-14 DIAGNOSIS — G4733 Obstructive sleep apnea (adult) (pediatric): Secondary | ICD-10-CM

## 2014-06-14 NOTE — Progress Notes (Signed)
   Subjective:    Patient ID: Connie Hall, female    DOB: October 21, 1965, 48 y.o.   MRN: [id number]  HPI Patient comes in today for followup of her known obstructive sleep apnea.  She was diagnosed with severe OSA in 2011, and started on CPAP. I have not seen her since that time, but fortunately she has done very well on her device. Her recent download shows fantastic compliance, and also rate control of her AHI. The patient feels that she has slept well with the machine, but most recently has had some insomnia issues because of stress. Overall, she feels that she is rested during the day without significant sleepiness. It should be noted that her weight is up about 25 pounds since her last visit in 2011.   Review of Systems  Constitutional: Negative for fever and unexpected weight change.  HENT: Negative for congestion, dental problem, ear pain, nosebleeds, postnasal drip, rhinorrhea, sinus pressure, sneezing, sore throat and trouble swallowing.   Eyes: Negative for redness and itching.  Respiratory: Negative for cough, chest tightness, shortness of breath and wheezing.   Cardiovascular: Negative for palpitations and leg swelling.  Gastrointestinal: Negative for nausea and vomiting.  Genitourinary: Negative for dysuria.  Musculoskeletal: Negative for joint swelling.  Skin: Negative for rash.  Neurological: Negative for headaches.  Hematological: Does not bruise/bleed easily.  Psychiatric/Behavioral: Negative for dysphoric mood. The patient is not nervous/anxious.        Objective:   Physical Exam Obese female in no acute distress Nose without purulence or discharge noted Neck without lymphadenopathy or thyromegaly No skin breakdown or pressure necrosis from the CPAP mask Lower extremities with mild edema, no cyanosis Alert and oriented, moves all 4 extremities.       Assessment & Plan:

## 2014-06-14 NOTE — Patient Instructions (Signed)
Stay on cpap, and keep up with your mask changes and supplies. Keep working on weight loss followup with me again in one year

## 2014-06-14 NOTE — Assessment & Plan Note (Signed)
The patient has a history of severe obstructive sleep apnea, and has been on CPAP compliantly since her last visit in 2011. She reports no issues with her mask fit, and has no issues with her set pressure. Her download shows excellent compliance over the last one year, and good control of her AHI. She feels that she is sleeping well with her device, with adequate daytime alertness. However, over the last one month has had difficulties with insomnia do to stress, and this has disrupted her sleep somewhat and caused some daytime fatigue. I have asked her to continue on her CPAP device, and to keep up with her mask changes and supplies. I've also encouraged her work aggressively on weight loss.

## 2014-07-06 ENCOUNTER — Other Ambulatory Visit: Payer: Self-pay | Admitting: Internal Medicine

## 2014-07-06 ENCOUNTER — Other Ambulatory Visit: Payer: Commercial Managed Care - PPO | Admitting: Internal Medicine

## 2014-07-06 DIAGNOSIS — E785 Hyperlipidemia, unspecified: Secondary | ICD-10-CM

## 2014-07-06 LAB — LIPID PANEL
Cholesterol: 161 mg/dL (ref 0–200)
HDL: 39 mg/dL — AB (ref >39)
LDL Cholesterol: 72 mg/dL (ref 0–99)
TRIGLYCERIDES: 249 mg/dL — AB (ref <150)
Total CHOL/HDL Ratio: 4.1 Ratio
VLDL: 50 mg/dL — ABNORMAL HIGH (ref 0–40)

## 2014-07-06 LAB — HEPATIC FUNCTION PANEL
ALBUMIN: 4.1 g/dL (ref 3.5–5.2)
ALK PHOS: 46 U/L (ref 39–117)
ALT: 14 U/L (ref 0–35)
AST: 14 U/L (ref 0–37)
Bilirubin, Direct: 0.1 mg/dL (ref 0.0–0.3)
Indirect Bilirubin: 0.3 mg/dL (ref 0.2–1.2)
TOTAL PROTEIN: 7.2 g/dL (ref 6.0–8.3)
Total Bilirubin: 0.4 mg/dL (ref 0.2–1.2)

## 2014-07-08 ENCOUNTER — Encounter: Payer: Self-pay | Admitting: Internal Medicine

## 2014-07-08 ENCOUNTER — Ambulatory Visit (INDEPENDENT_AMBULATORY_CARE_PROVIDER_SITE_OTHER): Payer: Commercial Managed Care - PPO | Admitting: Internal Medicine

## 2014-07-08 VITALS — BP 120/80 | HR 89 | Temp 98.8°F | Ht 59.0 in | Wt 229.0 lb

## 2014-07-08 DIAGNOSIS — E8881 Metabolic syndrome: Secondary | ICD-10-CM

## 2014-07-08 DIAGNOSIS — E785 Hyperlipidemia, unspecified: Secondary | ICD-10-CM

## 2014-07-08 DIAGNOSIS — E119 Type 2 diabetes mellitus without complications: Secondary | ICD-10-CM

## 2014-07-08 DIAGNOSIS — E669 Obesity, unspecified: Secondary | ICD-10-CM

## 2014-07-08 LAB — HEMOGLOBIN A1C
Hgb A1c MFr Bld: 6.2 % — ABNORMAL HIGH (ref <5.7)
Mean Plasma Glucose: 131 mg/dL — ABNORMAL HIGH (ref <117)

## 2014-07-09 ENCOUNTER — Telehealth: Payer: Self-pay

## 2014-07-09 NOTE — Telephone Encounter (Signed)
Patient aware of lab results and directions. 

## 2014-07-09 NOTE — Telephone Encounter (Signed)
-----   Message from Elby Showers, MD sent at 07/09/2014 12:26 PM EST ----- Please call pt. Hgb AIC has gone up a bit from 6.0 to 6.2% Recommend diet and exercise.

## 2014-09-24 ENCOUNTER — Other Ambulatory Visit: Payer: Commercial Managed Care - PPO | Admitting: Internal Medicine

## 2014-09-30 ENCOUNTER — Other Ambulatory Visit: Payer: Self-pay | Admitting: Internal Medicine

## 2014-10-21 NOTE — Patient Instructions (Signed)
Encouraged diet exercise and weight loss. Consider [facility] program. Return in 6 months for physical examination.

## 2014-10-21 NOTE — Progress Notes (Signed)
   Subjective:    Patient ID: Connie Hall, female    DOB: 1965/09/17, 49 y.o.   MRN: [id number]  HPI Six-month follow-up on hyperlipidemia and controlled type 2 diabetes mellitus. Diabetes is diet-controlled. She is on Lipitor 10 mg daily for hyperlipidemia. Unfortunately triglycerides are higher. At last visit they were 203 and now they're 249. Hemoglobin A1c is 6.2% and previously was 6%. At last visit patient weighed 226 pounds. Now weighs 229 pounds.    Review of Systems     Objective:   Physical Exam  Skin warm and dry. Neck supple; no thyromegaly. Chest clear to auscultation. Cardiac exam regular rate and rhythm. Extremities without edema.      Assessment & Plan:  Type 2 diabetes mellitus-diet controlled. Hemoglobin A1c has increased from 6.0% 6.2%. Needs to be serious about diet exercise and weight loss  Hyperlipidemia-triglycerides have increased from 203-249. Total cholesterol and LDL cholesterol levels are stable on Lipitor 10 mg daily  Obesity-encouraged diet exercise and weight loss

## 2014-11-01 ENCOUNTER — Ambulatory Visit: Payer: 59 | Attending: Orthopedic Surgery

## 2014-11-01 DIAGNOSIS — M25611 Stiffness of right shoulder, not elsewhere classified: Secondary | ICD-10-CM | POA: Insufficient documentation

## 2014-11-01 DIAGNOSIS — M25511 Pain in right shoulder: Secondary | ICD-10-CM | POA: Diagnosis not present

## 2014-11-01 DIAGNOSIS — R29898 Other symptoms and signs involving the musculoskeletal system: Secondary | ICD-10-CM | POA: Insufficient documentation

## 2014-11-01 NOTE — Patient Instructions (Signed)
Asked pt to ice 2-3 x/day, to do HEP with wall slide and scapula retraction and depression 2-3x/day 3-5 reps

## 2014-11-01 NOTE — Therapy (Signed)
[place], [place], [address] Phone: [phone]   Fax:  [phone]  Physical Therapy Evaluation  Patient Details  Name: Connie Hall MRN: [id number] Date of Birth: 01/19/1966 Referring Provider:  Johnny Bridge, MD  Encounter Date: 11/01/2014      PT End of Session - 11/01/14 0841    Visit Number 1   Number of Visits 12   Date for PT Re-Evaluation 12/13/14   PT Start Time 0755   PT Stop Time 0838   PT Time Calculation (min) 43 min   Activity Tolerance Patient tolerated treatment well   Behavior During Therapy [facility] for tasks assessed/performed      Past Medical History  Diagnosis Date  . History of discoid lupus erythematosus   . Restless legs syndrome (RLS)   . Vitamin D deficiency   . Hyperlipidemia   . DM (diabetes mellitus)   . Deviated nasal septum     Past Surgical History  Procedure Laterality Date  . Sebaceous cyst removal, back  03/2008  . Rotator cuff repair      left    There were no vitals taken for this visit.  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    Currently in Pain? Yes   Pain Score 2    Pain Location Shoulder   Pain Orientation Right   Pain Type Chronic pain   Pain Onset More than a month ago   Pain Frequency Intermittent   Aggravating Factors  Using RT arm   Pain Relieving Factors Nothing   Effect of Pain on Daily Activities limited   Multiple Pain Sites No          [facility] PT Assessment - 11/01/14 0807    Assessment   Medical Diagnosis RT shoulder pain   Onset Date --  September 2015   Next MD Visit 11/25/14   Precautions   Precautions None   Restrictions   Weight Bearing Restrictions No   Balance Screen    Has the patient fallen in the past 6 months No   Has the patient had a decrease in activity level because of a fear of falling?  No   Is the patient reluctant to leave their home because of a fear of falling?  No   Prior Function   Level of Independence Needs assistance with homemaking  uses Lt arm for most activity   ROM / Strength   AROM / PROM / Strength AROM;PROM;Strength   AROM   AROM Assessment Site Shoulder   Right/Left Shoulder Right;Left   Right Shoulder Extension 45 Degrees   Right Shoulder Flexion 115 Degrees   Right Shoulder ABduction 75 Degrees   Right Shoulder Internal Rotation 57 Degrees   Right Shoulder External Rotation 58 Degrees   Left Shoulder Extension 50 Degrees   Left Shoulder Flexion 135 Degrees   Left Shoulder ABduction 145 Degrees   Left Shoulder Internal Rotation 68 Degrees   Left Shoulder External Rotation 66 Degrees   PROM   PROM Assessment Site Shoulder   Right/Left Shoulder Right   Right Shoulder Flexion 145 Degrees   Right Shoulder ABduction 110 Degrees   Right Shoulder Internal Rotation 70 Degrees  Right Shoulder External Rotation 85 Degrees   Right Shoulder Horizontal ABduction 15 Degrees   Right Shoulder Horizontal  ADduction 20 Degrees   Strength   Strength Assessment Site Shoulder   Right/Left Shoulder Right   Right Shoulder Flexion 4+/5   Right Shoulder Extension 5/5   Right Shoulder ABduction 4+/5   Right Shoulder Internal Rotation 5/5   Right Shoulder External Rotation 4/5                  [facility] Adult PT Treatment/Exercise - 11/01/14 0839    Exercises   Exercises Shoulder   Shoulder Exercises: Seated   Other Seated Exercises Scapula retraction and deperession for HEP                PT Education - 11/01/14 0841    Education provided Yes   Education Details POC, Shoulder active range   Person(s) Educated Patient   Methods Explanation;Demonstration;Tactile cues;Verbal cues;Handout   Comprehension Returned  demonstration;Verbalized understanding          PT Short Term Goals - 11/01/14 0844    PT SHORT TERM GOAL #1   Title She will be independent with initial HEP.    Time 3   Period Weeks   Status New   PT SHORT TERM GOAL #2   Title Pain decreased 25% generally and she will report greater ease shaving and putting on deoderant   Time 3   Period Weeks   Status New   PT SHORT TERM GOAL #3   Title Increase active flexion and abduciton to 120 degrees   Time 6   Period Weeks   Status New           PT Long Term Goals - 11/01/14 1950    PT LONG TERM GOAL #1   Title She will be independent with all HEP issued as of last visit   Time 6   Period Weeks   Status New   PT LONG TERM GOAL #2   Title she will report pain decreaed 50% or mroe with dressing and home tasks   Time 6   Period Weeks   Status New   PT LONG TERM GOAL #3   Title She will report able to lye on RT shoulder for 15-30 min   Time 6   Period Weeks   Status New   PT LONG TERM GOAL #4   Title She will have RT + LT active shouldr motion to improve selfcare to normal and home task to moderate   Time 6   Period Weeks   Status New               Plan - 11/01/14 9326    Clinical Impression Statement She is limited by pain and decreased active and passive motion. This affects independence with self care and ADL's/    Pt will benefit from skilled therapeutic intervention in order to improve on the following deficits Impaired UE functional use;Decreased range of motion;Decreased strength;Pain   Rehab Potential Good   PT Frequency 2x / week   PT Duration 6 weeks   PT Treatment/Interventions Patient/family education;Therapeutic exercise;Passive range of motion;Manual techniques;Ultrasound;Cryotherapy;Electrical Stimulation;ADLs/Self Care Home Management   PT Next Visit Plan Modalities with mobs and gentle range, isometrics versus rockwood   PT Home Exercise Plan Ice and scapula active motion   Consulted and Agree  with Plan of Care Patient         Problem List Patient Active Problem List  Diagnosis Date Noted  . Diabetes mellitus 05/11/2011  . Carpal tunnel syndrome 05/11/2011  . Dependent edema 05/11/2011  . Obesity 05/11/2011  . Obstructive sleep apnea 11/03/2009  . ALLERGIC RHINITIS 10/21/2009  . LUPUS ERYTHEMATOSUS, DISCOID 10/21/2009  . HYPERSOMNIA 10/21/2009  . VITAMIN D DEFICIENCY 10/20/2009  . HYPERLIPIDEMIA 10/20/2009  . RESTLESS LEG SYNDROME 10/20/2009    Darrel Hoover PT 11/01/2014, 8:53 AM  [facility] [address] [place], [place], [address] Phone: [phone]   Fax:  [phone]

## 2014-11-08 ENCOUNTER — Ambulatory Visit: Payer: 59 | Admitting: Rehabilitation

## 2014-11-08 DIAGNOSIS — R29898 Other symptoms and signs involving the musculoskeletal system: Secondary | ICD-10-CM

## 2014-11-08 DIAGNOSIS — M25611 Stiffness of right shoulder, not elsewhere classified: Secondary | ICD-10-CM

## 2014-11-08 DIAGNOSIS — M25511 Pain in right shoulder: Secondary | ICD-10-CM

## 2014-11-08 NOTE — Therapy (Signed)
[place], [place], [address] Phone: [phone]   Fax:  [phone]  Physical Therapy Treatment  Patient Details  Name: Connie Hall MRN: [id number] Date of Birth: 1966/03/09 Referring Provider:  Elby Showers, MD  Encounter Date: 11/08/2014      PT End of Session - 11/08/14 1539    Visit Number 2   Number of Visits 12   Date for PT Re-Evaluation 12/13/14   PT Start Time 0300   PT Stop Time 0339   PT Time Calculation (min) 39 min      Past Medical History  Diagnosis Date  . History of discoid lupus erythematosus   . Restless legs syndrome (RLS)   . Vitamin D deficiency   . Hyperlipidemia   . DM (diabetes mellitus)   . Deviated nasal septum     Past Surgical History  Procedure Laterality Date  . Sebaceous cyst removal, back  03/2008  . Rotator cuff repair      left    There were no vitals filed for this visit.  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 Yes   Pain Score 3    Pain Location Shoulder   Pain Orientation Right   Pain Descriptors / Indicators Aching;Shooting   Aggravating Factors  using rt arm    Pain Relieving Factors nothing                       [facility] Adult PT Treatment/Exercise - 11/08/14 1516    Shoulder Exercises: Supine   Other Supine Exercises supine cane exercises   Shoulder Exercises: Standing   Other Standing Exercises Standing cane flex, abdct, IR, EXT    Other Standing Exercises standing rockwood yellow band x10, no increased pain except ER   Shoulder Exercises: Pulleys   Flexion 2 minutes   Other Pulley Exercises scaption x 1 minute   Shoulder Exercises: ROM/Strengthening   Other ROM/Strengthening Exercises wall ladder x 5 right    Shoulder Exercises: Stretch   Corner  Stretch 1 rep;30 seconds                PT Education - 11/08/14 1538    Education provided Yes   Education Details Standing cane, yellow band rockwood    Person(s) Educated Patient   Methods Explanation;Handout   Comprehension Verbalized understanding          PT Short Term Goals - 11/01/14 0844    PT SHORT TERM GOAL #1   Title She will be independent with initial HEP.    Time 3   Period Weeks   Status New   PT SHORT TERM GOAL #2   Title Pain decreased 25% generally and she will report greater ease shaving and putting on deoderant   Time 3   Period Weeks   Status New   PT SHORT TERM GOAL #3   Title Increase active flexion and abduciton to 120 degrees   Time 6   Period Weeks   Status New           PT Long Term Goals - 11/01/14 0037    PT LONG TERM GOAL #1   Title She will be independent with all HEP issued as of last visit   Time 6  Period Weeks   Status New   PT LONG TERM GOAL #2   Title she will report pain decreaed 50% or mroe with dressing and home tasks   Time 6   Period Weeks   Status New   PT LONG TERM GOAL #3   Title She will report able to lye on RT shoulder for 15-30 min   Time 6   Period Weeks   Status New   PT LONG TERM GOAL #4   Title She will have RT + LT active shouldr motion to improve selfcare to normal and home task to moderate   Time 6   Period Weeks   Status New               Plan - 11/08/14 1542    Clinical Impression Statement Pain increased to 4-5/10 with therex. Pt declined ice and states it has not helped when she tries it at home. Able to establish HEP for strength and flexibility   PT Home Exercise Plan add corner stretch to HEP, review HEP, mobs        Problem List Patient Active Problem List   Diagnosis Date Noted  . Diabetes mellitus 05/11/2011  . Carpal tunnel syndrome 05/11/2011  . Dependent edema 05/11/2011  . Obesity 05/11/2011  . Obstructive sleep apnea 11/03/2009  . ALLERGIC RHINITIS  10/21/2009  . LUPUS ERYTHEMATOSUS, DISCOID 10/21/2009  . HYPERSOMNIA 10/21/2009  . VITAMIN D DEFICIENCY 10/20/2009  . HYPERLIPIDEMIA 10/20/2009  . RESTLESS LEG SYNDROME 10/20/2009    Dorene Ar, PTA 11/08/2014, 3:47 PM  [facility] [address] [place], [place], [address] Phone: [phone]   Fax:  [phone]

## 2014-11-08 NOTE — Patient Instructions (Addendum)
Flexion (Eccentric) - Active-Assist (Cane)   Use unaffected arm to push affected arm forward. Avoid hiking shoulder. Keep palm relaxed. Slowly lower affected arm for 3-5 seconds, increasing use of affected arm. __10_ reps per set, __2_ sets per day, _7__ days per week. Copyright  VHI. All rights reserved  Cane Exercise: Abduction   Hold cane with right hand over end, palm-up, with other hand palm-down. Move arm out from side and up by pushing with other arm. Hold _3-5___ seconds. Repeat __10__ times. Do __2__ sessions per day.  http://gt2.exer.us/81   Copyright  VHI. All rights reserved.      Cane Exercise: Extension / Internal Rotation   Stand holding cane behind back with both hands palm-up. Slide cane up spine toward head. Hold __3-5__ seconds. Repeat _10___ times. Do __2__ sessions per day.  http://gt2.exer.us/85   Copyright  VHI. All rights reserved.  Kasandra Knudsen Exercise: Extension   Stand holding cane behind back with both hands palm-up. Lift the cane away from body. Hold _3-5___ seconds. Repeat __10__ times. Do _2___ sessions per day.  http://gt2.exer.us/83   Copyright  VHI. All rights reserved.   Flexibility: Corner Stretch   Standing in corner with hands just above shoulder level and feet ____ inches from corner, lean forward until a comfortable stretch is felt across chest. Hold __30__ seconds. Repeat 3____ times per set. Do ___2_ sets per session. Do ___2_ sessions per day.  http://orth.exer.us/343   Copyright  VHI. All rights reserved.

## 2014-11-11 ENCOUNTER — Encounter: Payer: 59 | Admitting: Rehabilitation

## 2014-11-16 ENCOUNTER — Ambulatory Visit: Payer: 59

## 2014-11-16 DIAGNOSIS — R29898 Other symptoms and signs involving the musculoskeletal system: Secondary | ICD-10-CM

## 2014-11-16 DIAGNOSIS — M25611 Stiffness of right shoulder, not elsewhere classified: Secondary | ICD-10-CM | POA: Diagnosis not present

## 2014-11-16 DIAGNOSIS — M25511 Pain in right shoulder: Secondary | ICD-10-CM

## 2014-11-16 NOTE — Therapy (Signed)
[place], [place], [address] Phone: [phone]   Fax:  [phone]  Physical Therapy Treatment  Patient Details  Name: Connie Hall MRN: [id number] Date of Birth: [birth year] Referring Provider:  Elby Showers, MD  Encounter Date: 11/16/2014      PT End of Session - 11/16/14 0836    Visit Number 3   Number of Visits 12   Date for PT Re-Evaluation 12/13/14   PT Start Time 0800   PT Stop Time 0855   PT Time Calculation (min) 55 min   Activity Tolerance Patient tolerated treatment well;Patient limited by pain   Behavior During Therapy [facility] for tasks assessed/performed      Past Medical History  Diagnosis Date  . History of discoid lupus erythematosus   . Restless legs syndrome (RLS)   . Vitamin D deficiency   . Hyperlipidemia   . DM (diabetes mellitus)   . Deviated nasal septum     Past Surgical History  Procedure Laterality Date  . Sebaceous cyst removal, back  03/2008  . Rotator cuff repair      left    There were no vitals filed for this visit.  Visit Diagnosis:  Stiffness of shoulder joint, right  Weakness of shoulder  Pain, joint, shoulder, right      Subjective Assessment - 11/16/14 0808    Symptoms Soreness RT shoulder. No better   Pertinent History No injury. Pain just started in september. Similar to LT shoulder. RTC repair in LT shoulder   Patient Stated Goals decrease pain and improve use of RT arm   Currently in Pain? Yes   Pain Score 5    Multiple Pain Sites No            [facility] PT Assessment - 11/16/14 0810    AROM   Right Shoulder Flexion 117 Degrees   Right Shoulder ABduction 80 Degrees                   [facility] Adult PT Treatment/Exercise - 11/16/14 0810    Shoulder Exercises: Standing   External Rotation Strengthening;Right;12 reps;Theraband   Theraband Level (Shoulder External Rotation) Level 1 (Yellow)   Internal Rotation  Strengthening;Right;12 reps   Theraband Level (Shoulder Internal Rotation) Level 1 (Yellow)   Flexion Strengthening;Right;12 reps   Theraband Level (Shoulder Flexion) Level 1 (Yellow)   Row Strengthening;Right;12 reps   Theraband Level (Shoulder Row) Level 1 (Yellow)   Shoulder Exercises: Pulleys   Flexion 2 minutes   Shoulder Exercises: Stretch   Other Shoulder Stretches Cane exercies flexion. abduction behind back x 12    Modalities   Modalities Moist Heat;Electrical Stimulation   Moist Heat Therapy   Number Minutes Moist Heat 20 Minutes   Moist Heat Location Shoulder   Electrical Stimulation   Electrical Stimulation Location RT shoulder   Electrical Stimulation Action IFC   Electrical Stimulation Parameters L8   Electrical Stimulation Goals Pain   Manual Therapy   Manual Therapy Passive ROM;Joint mobilization                  PT Short Term Goals - 11/01/14 6503    PT SHORT TERM GOAL #1   Title She will be independent with initial HEP.    Time 3   Period Weeks   Status New   PT SHORT TERM GOAL #2   Title Pain decreased 25% generally and she will report greater ease shaving and putting on deoderant  Time 3   Period Weeks   Status New   PT SHORT TERM GOAL #3   Title Increase active flexion and abduciton to 120 degrees   Time 6   Period Weeks   Status New           PT Long Term Goals - 11/01/14 6803    PT LONG TERM GOAL #1   Title She will be independent with all HEP issued as of last visit   Time 6   Period Weeks   Status New   PT LONG TERM GOAL #2   Title she will report pain decreaed 50% or mroe with dressing and home tasks   Time 6   Period Weeks   Status New   PT LONG TERM GOAL #3   Title She will report able to lye on RT shoulder for 15-30 min   Time 6   Period Weeks   Status New   PT LONG TERM GOAL #4   Title She will have RT + LT active shouldr motion to improve selfcare to normal and home task to moderate   Time 6   Period Weeks    Status New               Plan - 11/16/14 2122    Clinical Impression Statement She was good with treatment but pain limited stretching tolerance.  Range not improved muchModalities helped with pain   PT Next Visit Plan Coontniue manual, STW modalities   Consulted and Agree with Plan of Care Patient        Problem List Patient Active Problem List   Diagnosis Date Noted  . Diabetes mellitus 05/11/2011  . Carpal tunnel syndrome 05/11/2011  . Dependent edema 05/11/2011  . Obesity 05/11/2011  . Obstructive sleep apnea 11/03/2009  . ALLERGIC RHINITIS 10/21/2009  . LUPUS ERYTHEMATOSUS, DISCOID 10/21/2009  . HYPERSOMNIA 10/21/2009  . VITAMIN D DEFICIENCY 10/20/2009  . HYPERLIPIDEMIA 10/20/2009  . RESTLESS LEG SYNDROME 10/20/2009    Darrel Hoover PT 11/16/2014, 8:38 AM  [facility] [address] [place], [place], [address] Phone: [phone]   Fax:  [phone]

## 2014-11-18 ENCOUNTER — Encounter: Payer: 59 | Admitting: Rehabilitation

## 2014-11-23 ENCOUNTER — Ambulatory Visit: Payer: 59 | Admitting: Physical Therapy

## 2014-11-23 DIAGNOSIS — M25611 Stiffness of right shoulder, not elsewhere classified: Secondary | ICD-10-CM

## 2014-11-23 DIAGNOSIS — M25511 Pain in right shoulder: Secondary | ICD-10-CM

## 2014-11-23 DIAGNOSIS — R29898 Other symptoms and signs involving the musculoskeletal system: Secondary | ICD-10-CM

## 2014-11-23 NOTE — Therapy (Addendum)
[place], [place], [address] Phone: [phone]   Fax:  [phone]  Physical Therapy Treatment  Patient Details  Name: Connie Hall MRN: [id number] Date of Birth: [birth year] Referring Provider:  Elby Showers, MD  Encounter Date: 11/23/2014      PT End of Session - 11/23/14 1638    Visit Number 4   Number of Visits 12   Date for PT Re-Evaluation 12/13/14   PT Start Time 0430   PT Stop Time 0510   PT Time Calculation (min) 40 min      Past Medical History  Diagnosis Date  . History of discoid lupus erythematosus   . Restless legs syndrome (RLS)   . Vitamin D deficiency   . Hyperlipidemia   . DM (diabetes mellitus)   . Deviated nasal septum     Past Surgical History  Procedure Laterality Date  . Sebaceous cyst removal, back  03/2008  . Rotator cuff repair      left    There were no vitals filed for this visit.  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 I still have aches and pains. I think I am going to get a cortisone injection. I still have terrible pain at night in my shoulder.   Currently in Pain? Yes   Pain Score 2    Pain Location Shoulder   Pain Orientation Right   Pain Descriptors / Indicators Aching   Pain Frequency Intermittent   Aggravating Factors  trying to sleep   Pain Relieving Factors rest            [facility] PT Assessment - 11/23/14 1642    AROM   Right Shoulder Flexion 125 Degrees   Right Shoulder ABduction 110 Degrees                   [facility] Adult PT Treatment/Exercise - 11/23/14 1639    Shoulder Exercises: Supine   Other Supine Exercises supine cane exercises pullover and press up with 2# x 15 each   Shoulder Exercises: Sidelying   External  Rotation AROM;Strengthening;Right;20 reps;Weights   External Rotation Weight (lbs) 1   External Rotation Limitations 10 reps AROm 10 reps 1#   ABduction AROM;Right;20 reps   Other Sidelying Exercises Scapular mobs    Shoulder Exercises: Standing   Other Standing Exercises standing rockwood yellow band x20, no increased pain except ER   Shoulder Exercises: Pulleys   Flexion 2 minutes   Shoulder Exercises: ROM/Strengthening   UBE (Upper Arm Bike) 2.5 minutes forward 1.5 minutes backwards level 1.5   Other ROM/Strengthening Exercises wall ladder x 5 right                   PT Short Term Goals - 11/23/14 1711    PT SHORT TERM GOAL #1   Title She will be independent with initial HEP.    Time 3   Period Weeks   Status Achieved   PT SHORT TERM GOAL #2   Title Pain decreased 25% generally and she will report greater ease shaving and putting on deoderant   Time 3   Period Weeks   Status On-going   PT SHORT TERM GOAL #3   Title Increase active flexion and abduciton  to 120 degrees   Time 6   Period Weeks   Status Partially Met           PT Long Term Goals - 11/23/14 1711    PT LONG TERM GOAL #1   Title She will be independent with all HEP issued as of last visit   Time 6   Period Weeks   Status On-going   PT LONG TERM GOAL #2   Title she will report pain decreaed 50% or mroe with dressing and home tasks   Time 6   Period Weeks   Status On-going   PT LONG TERM GOAL #3   Title She will report able to lye on RT shoulder for 15-30 min   Time 6   Period Weeks   Status On-going   PT LONG TERM GOAL #4   Title She will have RT + LT active shouldr motion to improve selfcare to normal and home task to moderate   Time 6   Period Weeks   Status On-going               Plan - 11/23/14 1710    Clinical Impression Statement AROM improved. See Goals Met.  Pain increased to 4/10 after treatment. Pt declined modalities.   PT Next Visit Plan ROM, strength, manula and  modalities as needed        Problem List Patient Active Problem List   Diagnosis Date Noted  . Diabetes mellitus 05/11/2011  . Carpal tunnel syndrome 05/11/2011  . Dependent edema 05/11/2011  . Obesity 05/11/2011  . Obstructive sleep apnea 11/03/2009  . ALLERGIC RHINITIS 10/21/2009  . LUPUS ERYTHEMATOSUS, DISCOID 10/21/2009  . HYPERSOMNIA 10/21/2009  . VITAMIN D DEFICIENCY 10/20/2009  . HYPERLIPIDEMIA 10/20/2009  . RESTLESS LEG SYNDROME 10/20/2009    Dorene Ar, PTA 11/23/2014, 5:14 PM  [place], [place], [address] Phone: [phone]   Fax:  [phone]     PHYSICAL THERAPY DISCHARGE SUMMARY  Visits from Start of Care: 4  Current functional level related to goals / functional outcomes: Unknown   Remaining deficits: Unknown   Education / Equipment: HEP Plan:                                                    Patient goals were not met. Patient is being discharged due to not returning since the last visit.  ?????   Darrel Hoover, PT      07/20/15    9:23 AM

## 2015-01-18 ENCOUNTER — Other Ambulatory Visit: Payer: 59 | Admitting: Internal Medicine

## 2015-01-18 DIAGNOSIS — Z130 Encounter for screening for diseases of the blood and blood-forming organs and certain disorders involving the immune mechanism: Secondary | ICD-10-CM

## 2015-01-18 DIAGNOSIS — Z1321 Encounter for screening for nutritional disorder: Secondary | ICD-10-CM

## 2015-01-18 DIAGNOSIS — Z1329 Encounter for screening for other suspected endocrine disorder: Secondary | ICD-10-CM

## 2015-01-18 DIAGNOSIS — Z0000 Encounter for general adult medical examination without abnormal findings: Secondary | ICD-10-CM

## 2015-01-18 DIAGNOSIS — E119 Type 2 diabetes mellitus without complications: Secondary | ICD-10-CM

## 2015-01-18 DIAGNOSIS — Z13220 Encounter for screening for lipoid disorders: Secondary | ICD-10-CM

## 2015-01-18 LAB — COMPLETE METABOLIC PANEL WITH GFR
ALBUMIN: 4 g/dL (ref 3.5–5.2)
ALK PHOS: 45 U/L (ref 39–117)
ALT: 12 U/L (ref 0–35)
AST: 12 U/L (ref 0–37)
BUN: 15 mg/dL (ref 6–23)
CHLORIDE: 102 meq/L (ref 96–112)
CO2: 23 meq/L (ref 19–32)
Calcium: 9 mg/dL (ref 8.4–10.5)
Creat: 0.76 mg/dL (ref 0.50–1.10)
Glucose, Bld: 100 mg/dL — ABNORMAL HIGH (ref 70–99)
POTASSIUM: 4.5 meq/L (ref 3.5–5.3)
SODIUM: 137 meq/L (ref 135–145)
TOTAL PROTEIN: 7.1 g/dL (ref 6.0–8.3)
Total Bilirubin: 0.4 mg/dL (ref 0.2–1.2)

## 2015-01-18 LAB — CBC WITH DIFFERENTIAL/PLATELET
Basophils Absolute: 0.1 10*3/uL (ref 0.0–0.1)
Basophils Relative: 1 % (ref 0–1)
Eosinophils Absolute: 0.1 10*3/uL (ref 0.0–0.7)
Eosinophils Relative: 1 % (ref 0–5)
HCT: 39 % (ref 36.0–46.0)
HEMOGLOBIN: 12.7 g/dL (ref 12.0–15.0)
LYMPHS ABS: 2.1 10*3/uL (ref 0.7–4.0)
Lymphocytes Relative: 24 % (ref 12–46)
MCH: 27.9 pg (ref 26.0–34.0)
MCHC: 32.6 g/dL (ref 30.0–36.0)
MCV: 85.5 fL (ref 78.0–100.0)
MPV: 8.7 fL (ref 8.6–12.4)
Monocytes Absolute: 0.4 10*3/uL (ref 0.1–1.0)
Monocytes Relative: 5 % (ref 3–12)
NEUTROS ABS: 6 10*3/uL (ref 1.7–7.7)
Neutrophils Relative %: 69 % (ref 43–77)
Platelets: 319 10*3/uL (ref 150–400)
RBC: 4.56 MIL/uL (ref 3.87–5.11)
RDW: 14.1 % (ref 11.5–15.5)
WBC: 8.7 10*3/uL (ref 4.0–10.5)

## 2015-01-18 LAB — LIPID PANEL
Cholesterol: 149 mg/dL (ref 0–200)
HDL: 32 mg/dL — ABNORMAL LOW (ref >=46)
LDL Cholesterol: 82 mg/dL (ref 0–99)
TRIGLYCERIDES: 177 mg/dL — AB (ref <150)
Total CHOL/HDL Ratio: 4.7 Ratio
VLDL: 35 mg/dL (ref 0–40)

## 2015-01-18 LAB — HEMOGLOBIN A1C
HEMOGLOBIN A1C: 6.3 % — AB (ref <5.7)
Mean Plasma Glucose: 134 mg/dL — ABNORMAL HIGH (ref <117)

## 2015-01-19 LAB — VITAMIN D 25 HYDROXY (VIT D DEFICIENCY, FRACTURES): Vit D, 25-Hydroxy: 35 ng/mL (ref 30–100)

## 2015-01-19 LAB — TSH: TSH: 1.652 u[IU]/mL (ref 0.350–4.500)

## 2015-01-20 ENCOUNTER — Ambulatory Visit (INDEPENDENT_AMBULATORY_CARE_PROVIDER_SITE_OTHER): Payer: 59 | Admitting: Internal Medicine

## 2015-01-20 ENCOUNTER — Encounter: Payer: Self-pay | Admitting: Internal Medicine

## 2015-01-20 VITALS — BP 134/84 | HR 94 | Temp 98.8°F | Ht 59.0 in | Wt 239.0 lb

## 2015-01-20 DIAGNOSIS — Z23 Encounter for immunization: Secondary | ICD-10-CM

## 2015-01-20 DIAGNOSIS — E785 Hyperlipidemia, unspecified: Secondary | ICD-10-CM

## 2015-01-20 DIAGNOSIS — G2581 Restless legs syndrome: Secondary | ICD-10-CM

## 2015-01-20 DIAGNOSIS — Z8639 Personal history of other endocrine, nutritional and metabolic disease: Secondary | ICD-10-CM | POA: Diagnosis not present

## 2015-01-20 DIAGNOSIS — E669 Obesity, unspecified: Secondary | ICD-10-CM | POA: Diagnosis not present

## 2015-01-20 DIAGNOSIS — E119 Type 2 diabetes mellitus without complications: Secondary | ICD-10-CM

## 2015-01-20 DIAGNOSIS — G4733 Obstructive sleep apnea (adult) (pediatric): Secondary | ICD-10-CM

## 2015-01-20 DIAGNOSIS — R609 Edema, unspecified: Secondary | ICD-10-CM

## 2015-01-20 DIAGNOSIS — J342 Deviated nasal septum: Secondary | ICD-10-CM | POA: Diagnosis not present

## 2015-01-20 DIAGNOSIS — E8881 Metabolic syndrome: Secondary | ICD-10-CM | POA: Diagnosis not present

## 2015-01-20 DIAGNOSIS — Z0000 Encounter for general adult medical examination without abnormal findings: Secondary | ICD-10-CM

## 2015-01-20 MED ORDER — ECONAZOLE NITRATE 1 % EX CREA: TOPICAL_CREAM | Freq: Every day | CUTANEOUS | Status: DC

## 2015-01-20 NOTE — Patient Instructions (Addendum)
Refer to [facility].  Diet exercise and weight loss. Continue same medications.

## 2015-01-21 LAB — MICROALBUMIN / CREATININE URINE RATIO
Creatinine, Urine: 183.9 mg/dL
MICROALB UR: 1.3 mg/dL (ref <2.0)
MICROALB/CREAT RATIO: 7.1 mg/g (ref 0.0–30.0)

## 2015-01-24 ENCOUNTER — Encounter: Payer: Self-pay | Admitting: Internal Medicine

## 2015-01-24 NOTE — Progress Notes (Signed)
Subjective:    Patient ID: Connie Hall, female    DOB: 1966-01-31, 49 y.o.   MRN: [id number]  HPI  49 year old White Female for health maintenance and evaluation of medical problems including dependent edema, obesity, discoid lupus, restless leg syndrome, vitamin D deficiency, hyperlipidemia, type 2 diabetes mellitus, metabolic syndrome, and deviated nasal septum.  Pain medications cause nausea. She is allergic to Sulfa-causes hives.  Had tetanus immunization 2012 in emergency department because of laceration to right great toe when she stepped on a piece of tin.  Gets influenza immunization through employment.   History of deviated nasal septum. Has seen ENT physician but has not had it repaired.  Weight has been a problem for her-unable to lose weight. Has tried various diets and exercise regimens.  Past medical history: Left shoulder arthropathy 2009, right medial epicondylitis August 2009, right carpal tunnel syndrome September 2009. Patient had sebaceous cyst removed from her back by Dr. Zella Richer August 2009. History of obstructive sleep apnea and has seen Dr. Gwenette Greet. Has CPAP machine.  Social history: She is single, never married. Does not smoke. Seldom consumes alcohol. Lives alone. She has a college degree. Family is in [place]. 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  Family history: Father with history of hypertension, stroke, coronary artery disease, kidney stones and diabetes. Mother with history of osteoporosis. No brothers. 4 sisters 2 of whom have diabetes. 2 sisters have sleep apnea. 3 sisters are overweight. One sister is in overall good health.    Review of Systems  HENT:       History of deviated nasal septum  Eyes: Negative.   Respiratory:       Sleep apnea  Cardiovascular: Negative.   Gastrointestinal: Negative.   Genitourinary: Negative.   Neurological: Negative.   Hematological: Negative.     Psychiatric/Behavioral: Negative.        Objective:   Physical Exam  Constitutional: She is oriented to person, place, and time. She appears well-developed and well-nourished. No distress.  HENT:  Head: Normocephalic and atraumatic.  Right Ear: External ear normal.  Left Ear: External ear normal.  Mouth/Throat: Oropharynx is clear and moist. No oropharyngeal exudate.  Eyes: Conjunctivae and EOM are normal. Pupils are equal, round, and reactive to light. Right eye exhibits no discharge. Left eye exhibits no discharge. No scleral icterus.  Neck: Neck supple. No JVD present. No thyromegaly present.  Cardiovascular: Normal rate, regular rhythm, normal heart sounds and intact distal pulses.   No murmur heard. Pulmonary/Chest: Effort normal and breath sounds normal. No respiratory distress. She has no wheezes. She has no rales.  Breast normal female without masses  Abdominal: Soft. Bowel sounds are normal. She exhibits no distension. There is no tenderness. There is no rebound and no guarding.  Genitourinary:  Pap taken 2014. Bimanual normal.  Musculoskeletal: She exhibits no edema.  Neurological: She is alert and oriented to person, place, and time. She has normal reflexes. No cranial nerve deficit. Coordination normal.  Skin: Skin is warm and dry. No rash noted. She is not diaphoretic.  Psychiatric: She has a normal mood and affect. Her behavior is normal. Judgment and thought content normal.  Vitals reviewed.         Assessment & Plan:  History of type 2 diabetes mellitus-stable at 6.3% hemoglobin A1c. Refer to [facility]  Obesity-refer to [facility]  Hyperlipidemia-triglycerides improved at 177  Dependent edema  Deviated nasal septum  Vitamin D  deficiency  History of discoid lupus  History of restless leg syndrome-treated with Requip  Plan: Encouraged diet exercise and weight loss. Continue same medications and return in 6 months. Refer to [facility]

## 2015-03-29 ENCOUNTER — Ambulatory Visit (INDEPENDENT_AMBULATORY_CARE_PROVIDER_SITE_OTHER): Payer: 59 | Admitting: Podiatry

## 2015-03-29 ENCOUNTER — Encounter: Payer: Self-pay | Admitting: Podiatry

## 2015-03-29 VITALS — BP 150/81 | HR 84 | Resp 16

## 2015-03-29 DIAGNOSIS — L603 Nail dystrophy: Secondary | ICD-10-CM | POA: Diagnosis not present

## 2015-03-29 NOTE — Progress Notes (Signed)
   Subjective:    Patient ID: Connie Hall, female    DOB: 1965-11-29, 49 y.o.   MRN: [id number]  HPI Comments: "I have some bad nails"  Patient c/o thick, discolored nails 1st bilateral for a couple months. She has been on lamisil in the past. It did clear. Overtime just started to notice discoloration again.     Review of Systems  All other systems reviewed and are negative.      Objective:   Physical Exam: I have reviewed her past medical history medications allergies surgeries and social history. I have also reviewed her chief complaint and statement. Pulses are palpable bilateral neurologic sensorium is intact per Semmes-Weinstein monofilament. Deep tendon reflexes are brisk and equal bilateral. Muscle strength +5 over 5 dorsiflexion plantar flexors and inverters everters all intrinsic musculature is intact. Orthopedic evaluation and states all joints distal to the ankle for range of motion without crepitation. Cutaneous evaluation demonstrates supple well-hydrated cutis she does have a thick yellow dystrophic I mycotic nail hallux bilateral left greater than right.        Assessment & Plan:  Assessment: Nail dystrophy cannot rule out onychomycosis.  Plan: Samples of the nail were taken today from the great toes bilateral. I will follow up with her once the results are returned.

## 2015-03-30 ENCOUNTER — Telehealth: Payer: Self-pay | Admitting: *Deleted

## 2015-03-30 DIAGNOSIS — B351 Tinea unguium: Secondary | ICD-10-CM

## 2015-03-30 NOTE — Telephone Encounter (Signed)
B/L 1st toenails fragments sent to [facility] for definitive diagnosis of fungal element.

## 2015-04-05 ENCOUNTER — Other Ambulatory Visit: Payer: Self-pay | Admitting: Internal Medicine

## 2015-04-12 ENCOUNTER — Encounter: Payer: Self-pay | Admitting: Pulmonary Disease

## 2015-04-25 ENCOUNTER — Telehealth: Payer: Self-pay | Admitting: Podiatry

## 2015-04-25 NOTE — Telephone Encounter (Signed)
Pt called in wanting too know her results from 03/29/2015

## 2015-04-26 NOTE — Telephone Encounter (Signed)
Informed pt the fungal culture results return 4-6 weeks, and we will call with Dr. Stephenie Acres recommendations.

## 2015-04-27 ENCOUNTER — Telehealth: Payer: Self-pay | Admitting: *Deleted

## 2015-04-27 NOTE — Telephone Encounter (Signed)
Left message on pt's home phone to call for instructions concerning the toenail culture results. I contacted pt [phone], informed pt of Dr. Stephenie Acres orders and transferred to schedulers.

## 2015-05-03 ENCOUNTER — Telehealth: Payer: Self-pay | Admitting: Pulmonary Disease

## 2015-05-03 DIAGNOSIS — G4733 Obstructive sleep apnea (adult) (pediatric): Secondary | ICD-10-CM

## 2015-05-03 NOTE — Telephone Encounter (Signed)
Okay to renew supplies Keep appointment with sleep M.D.

## 2015-05-03 NOTE — Telephone Encounter (Signed)
Order placed for replacement cpap supplies. lmtcb X1 to make pt aware.

## 2015-05-03 NOTE — Telephone Encounter (Signed)
Spoke with pt, states she needs new order for cpap supplies.  Pt uses [facility].   Order needs to have dates of usage and itemized list of what's needed.   Pt needs new mask of choice, tubing, filters.    Dr. Elsworth Soho are you ok with writing this rx for pt?  Former [place] pt scheduled to follow up with you next month.  Thanks!

## 2015-05-04 NOTE — Telephone Encounter (Signed)
Patient returned call, may be reached at [phone].

## 2015-05-04 NOTE — Telephone Encounter (Signed)
Spoke with the pt and notified that order was sent to [facility] for new CPAP supplies  Nothing further needed

## 2015-05-26 ENCOUNTER — Encounter: Payer: Self-pay | Admitting: Podiatry

## 2015-05-26 ENCOUNTER — Ambulatory Visit (INDEPENDENT_AMBULATORY_CARE_PROVIDER_SITE_OTHER): Payer: 59 | Admitting: Podiatry

## 2015-05-26 VITALS — BP 137/82 | HR 92 | Resp 16

## 2015-05-26 DIAGNOSIS — Z79899 Other long term (current) drug therapy: Secondary | ICD-10-CM

## 2015-05-26 DIAGNOSIS — B351 Tinea unguium: Secondary | ICD-10-CM

## 2015-05-26 MED ORDER — TERBINAFINE HCL 250 MG PO TABS: 250.0000 mg | ORAL_TABLET | Freq: Every day | ORAL | Status: DC

## 2015-05-26 NOTE — Patient Instructions (Signed)

## 2015-05-27 NOTE — Progress Notes (Signed)
She presents today for follow-up of her lab results from her toenails.  Objective: Vital signs are stable she's alert and oriented 3. Lab results demonstrate onychomycosis with a typical etiology. Pulses are palpable bilateral no change in nail plates.  Assessment: Onychomycosis per lab report.  Plan: Started her on Lamisil today 250 mg tablets 1 by mouth daily 30 days. We have a copy of her latest lab results which demonstrated no elevated liver enzymes. We will follow-up with her in 1 month for set of liver enzymes and a prescription for 90 pills. She will follow up with questions or concerns if needed.

## 2015-05-29 ENCOUNTER — Telehealth: Payer: 59 | Admitting: Physician Assistant

## 2015-05-29 DIAGNOSIS — J019 Acute sinusitis, unspecified: Secondary | ICD-10-CM

## 2015-05-29 DIAGNOSIS — B9689 Other specified bacterial agents as the cause of diseases classified elsewhere: Secondary | ICD-10-CM

## 2015-05-29 MED ORDER — BENZONATATE 100 MG PO CAPS: 100.0000 mg | ORAL_CAPSULE | Freq: Two times a day (BID) | ORAL | Status: DC | PRN

## 2015-05-29 MED ORDER — AMOXICILLIN-POT CLAVULANATE 875-125 MG PO TABS: 1.0000 | ORAL_TABLET | Freq: Two times a day (BID) | ORAL | Status: DC

## 2015-05-29 NOTE — Progress Notes (Signed)
We are sorry that you are not feeling well.  Here is how we plan to help!  Based on what you have shared with me it looks like you have sinusitis.  Sinusitis is inflammation and infection in the sinus cavities of the head.  Based on your presentation I believe you most likely have Acute Bacterial sinusitis.  This is an infection caused by bacteria and is treated with antibiotics.  I have prescribed Augmentin, an antibiotic in the penicillin family, one tablet twice daily with food, for 7 days.. You may use an oral decongestant such as Mucinex D or if you have glaucoma or high blood pressure use plain Mucinex.  Saline nasal sprays help and can safely be used as often as needed for congestion. If you develop worsening sinus pain, fever or notice severe headache and vision changes, or if symptoms are not better after completion of antibiotic, please schedule an appointment with a health care provider.   I have also sent in a prescription cough medication called Tessalon to use as directed.  Sinus infections are not as easily transmitted as other respiratory infection, however we still recommend that you avoid close contact with loved ones, especially the very young and elderly.  Remember to wash your hands thoroughly throughout the day as this is the number one way to prevent the spread of infection!  Home Care:  Only take medications as instructed by your medical team.  Complete the entire course of an antibiotic.  Do not take these medications with alcohol.  A steam or ultrasonic humidifier can help congestion.  You can place a towel over your head and breathe in the steam from hot water coming from a faucet.  Avoid close contacts especially the very young and the elderly.  Cover your mouth when you cough or sneeze.  Always remember to wash your hands.  Get Help Right Away If:  You develop worsening fever or sinus pain.  You develop a severe head ache or visual changes.  Your symptoms  persist after you have completed your treatment plan.  Make sure you  Understand these instructions.  Will watch your condition.  Will get help right away if you are not doing well or get worse.  Your e-visit answers were reviewed by a board certified advanced clinical practitioner to complete your personal care plan.  Depending on the condition, your plan could have included both over the counter or prescription medications.  If there is a problem please reply  once you have received a response from your provider.  Your safety is important to [place].  If you have drug allergies check your prescription carefully.    You can use MyChart to ask questions about today's visit, request a non-urgent call back, or ask for a work or school excuse for 24 hours related to this e-Visit. If it has been greater than 24 hours you will need to follow up with your provider, or enter a new e-Visit to address those concerns.  You will get an e-mail in the next two days asking about your experience.  I hope that your e-visit has been valuable and will speed your recovery. Thank you for using e-visits.

## 2015-06-15 ENCOUNTER — Ambulatory Visit: Payer: 59 | Admitting: Pulmonary Disease

## 2015-06-15 ENCOUNTER — Encounter: Payer: Self-pay | Admitting: Pulmonary Disease

## 2015-06-15 ENCOUNTER — Ambulatory Visit (INDEPENDENT_AMBULATORY_CARE_PROVIDER_SITE_OTHER): Payer: 59 | Admitting: Pulmonary Disease

## 2015-06-15 VITALS — BP 142/88 | HR 94 | Ht 59.0 in | Wt 241.2 lb

## 2015-06-15 DIAGNOSIS — G4733 Obstructive sleep apnea (adult) (pediatric): Secondary | ICD-10-CM | POA: Diagnosis not present

## 2015-06-15 DIAGNOSIS — G2581 Restless legs syndrome: Secondary | ICD-10-CM

## 2015-06-15 NOTE — Assessment & Plan Note (Signed)
Symptoms appear well controlled on Requip

## 2015-06-15 NOTE — Patient Instructions (Signed)
Your CPAP is set at 13 cm CPAP supplies will be renewed x 1 year

## 2015-06-15 NOTE — Assessment & Plan Note (Signed)
CPAP is set at 13 cm CPAP supplies will be renewed x 1 year  Weight loss encouraged, compliance with goal of at least 4-6 hrs every night is the expectation. Advised against medications with sedative side effects Cautioned against driving when sleepy - understanding that sleepiness will vary on a day to day basis  

## 2015-06-15 NOTE — Progress Notes (Signed)
   Subjective:    Patient ID: Connie Hall, female    DOB: 1966-07-17, 49 y.o.   MRN: [id number]  HPI  Chief Complaint  Patient presents with  . Follow-up    Former [place] pt; pt has 2 CPAP machines, pt has 1 for travel and 1 for home.      Former [facility] Patient with severe OSA maintained on  cpap 13cm. She has 2 CPAP machines and she uses one for travel She had great improvement in her daytime somnolence and alertness, snoring has stopped. She claims good compliance with her machine RLS well controlled on requip q hs - takes addn dose prn Mask is okay pressure is adequate, she has been able to get CPAP supplies sometime  NPSG 2011:  AHI 44/hr download 05/2014:  Great compliance, averaging 7-8 hrs a night, AHI well controlled  Download 04/2015 >> good usage, no residuals     Review of Systems neg for any significant sore throat, dysphagia, itching, sneezing, nasal congestion or excess/ purulent secretions, fever, chills, sweats, unintended wt loss, pleuritic or exertional cp, hempoptysis, orthopnea pnd or change in chronic leg swelling. Also denies presyncope, palpitations, heartburn, abdominal pain, nausea, vomiting, diarrhea or change in bowel or urinary habits, dysuria,hematuria, rash, arthralgias, visual complaints, headache, numbness weakness or ataxia.     Objective:   Physical Exam  Gen. Pleasant, obese, in no distress ENT - no lesions, no post nasal drip Neck: No JVD, no thyromegaly, no carotid bruits Lungs: no use of accessory muscles, no dullness to percussion, decreased without rales or rhonchi  Cardiovascular: Rhythm regular, heart sounds  normal, no murmurs or gallops, no peripheral edema Musculoskeletal: No deformities, no cyanosis or clubbing , no tremors       Assessment & Plan:

## 2015-06-30 ENCOUNTER — Ambulatory Visit (INDEPENDENT_AMBULATORY_CARE_PROVIDER_SITE_OTHER): Payer: 59 | Admitting: Podiatry

## 2015-06-30 ENCOUNTER — Encounter: Payer: Self-pay | Admitting: Podiatry

## 2015-06-30 DIAGNOSIS — Z79899 Other long term (current) drug therapy: Secondary | ICD-10-CM

## 2015-06-30 LAB — HEPATIC FUNCTION PANEL
ALBUMIN: 3.7 g/dL (ref 3.6–5.1)
ALT: 13 U/L (ref 6–29)
AST: 12 U/L (ref 10–35)
Alkaline Phosphatase: 43 U/L (ref 33–115)
BILIRUBIN TOTAL: 0.2 mg/dL (ref 0.2–1.2)
Total Protein: 6.5 g/dL (ref 6.1–8.1)

## 2015-06-30 MED ORDER — TERBINAFINE HCL 250 MG PO TABS: 250.0000 mg | ORAL_TABLET | Freq: Every day | ORAL | Status: DC

## 2015-06-30 NOTE — Progress Notes (Signed)
She presents today after taking her first month of Lamisil. She states there has been no change in her nail plates as of yet. He denies any complications utilizing the medication.  Objective: Onychomycosis bilateral.  Assessment: Onychomycosis.  Plan: We have requested another liver profile and she has been dispensed a prescription for 90 days of Lamisil 250 mg tablets 1 by mouth daily. I will follow up with her in 4 months.   Roselind Messier DPM

## 2015-07-01 ENCOUNTER — Telehealth: Payer: Self-pay | Admitting: *Deleted

## 2015-07-01 ENCOUNTER — Other Ambulatory Visit: Payer: Self-pay | Admitting: Internal Medicine

## 2015-07-01 NOTE — Telephone Encounter (Addendum)
-----   Message from Garrel Ridgel, [place] sent at 06/30/2015  5:02 PM EDT ----- Blood work looks ok and may continue to take medication.  Orders called to pt.

## 2015-07-07 ENCOUNTER — Encounter: Payer: Self-pay | Admitting: Pulmonary Disease

## 2015-07-08 NOTE — Addendum Note (Signed)
Addended by: Mathis Dad on: 07/08/2015 11:23 AM   Modules accepted: Orders

## 2015-07-15 ENCOUNTER — Encounter: Payer: Self-pay | Admitting: Podiatry

## 2015-08-02 ENCOUNTER — Other Ambulatory Visit: Payer: 59 | Admitting: Internal Medicine

## 2015-08-02 DIAGNOSIS — E119 Type 2 diabetes mellitus without complications: Secondary | ICD-10-CM

## 2015-08-02 DIAGNOSIS — Z79899 Other long term (current) drug therapy: Secondary | ICD-10-CM

## 2015-08-02 DIAGNOSIS — E785 Hyperlipidemia, unspecified: Secondary | ICD-10-CM

## 2015-08-02 LAB — HEMOGLOBIN A1C
Hgb A1c MFr Bld: 6.1 % — ABNORMAL HIGH (ref <5.7)
Mean Plasma Glucose: 128 mg/dL — ABNORMAL HIGH (ref <117)

## 2015-08-02 LAB — HEPATIC FUNCTION PANEL
ALBUMIN: 4 g/dL (ref 3.6–5.1)
ALK PHOS: 45 U/L (ref 33–115)
ALT: 11 U/L (ref 6–29)
AST: 10 U/L (ref 10–35)
Bilirubin, Direct: <0.1 mg/dL (ref <=0.2)
Total Bilirubin: 0.3 mg/dL (ref 0.2–1.2)
Total Protein: 6.7 g/dL (ref 6.1–8.1)

## 2015-08-02 LAB — LIPID PANEL
CHOL/HDL RATIO: 4.7 ratio (ref <=5.0)
Cholesterol: 150 mg/dL (ref 125–200)
HDL: 32 mg/dL — AB (ref >=46)
LDL CALC: 90 mg/dL (ref <130)
TRIGLYCERIDES: 141 mg/dL (ref <150)
VLDL: 28 mg/dL (ref <30)

## 2015-08-02 NOTE — Addendum Note (Signed)
Addended by: Beryle Quant on: 08/02/2015 09:16 AM   Modules accepted: Orders

## 2015-08-04 ENCOUNTER — Ambulatory Visit (INDEPENDENT_AMBULATORY_CARE_PROVIDER_SITE_OTHER): Payer: 59 | Admitting: Internal Medicine

## 2015-08-04 ENCOUNTER — Encounter: Payer: Self-pay | Admitting: Internal Medicine

## 2015-08-04 VITALS — BP 138/86 | HR 106 | Temp 97.9°F | Resp 18 | Ht 59.0 in | Wt 246.0 lb

## 2015-08-04 DIAGNOSIS — J019 Acute sinusitis, unspecified: Secondary | ICD-10-CM

## 2015-08-04 DIAGNOSIS — E785 Hyperlipidemia, unspecified: Secondary | ICD-10-CM | POA: Diagnosis not present

## 2015-08-04 DIAGNOSIS — R7302 Impaired glucose tolerance (oral): Secondary | ICD-10-CM | POA: Diagnosis not present

## 2015-08-04 DIAGNOSIS — E669 Obesity, unspecified: Secondary | ICD-10-CM | POA: Diagnosis not present

## 2015-08-04 DIAGNOSIS — B9689 Other specified bacterial agents as the cause of diseases classified elsewhere: Secondary | ICD-10-CM

## 2015-08-04 DIAGNOSIS — E8881 Metabolic syndrome: Secondary | ICD-10-CM

## 2015-08-04 MED ORDER — CLARITHROMYCIN 500 MG PO TABS: 500.0000 mg | ORAL_TABLET | Freq: Two times a day (BID) | ORAL | Status: DC

## 2015-08-04 MED ORDER — METHYLPREDNISOLONE ACETATE 80 MG/ML IJ SUSP
80.0000 mg | Freq: Once | INTRAMUSCULAR | Status: AC
  Administered 2015-08-04: 80 mg via INTRAMUSCULAR

## 2015-08-04 MED ORDER — BENZONATATE 100 MG PO CAPS: 200.0000 mg | ORAL_CAPSULE | Freq: Three times a day (TID) | ORAL | Status: DC | PRN

## 2015-08-27 NOTE — Progress Notes (Signed)
   Subjective:    Patient ID: Connie Hall, female    DOB: 04-10-1966, 49 y.o.   MRN: [id number]  HPI 49 year old White Female for six-month follow-up.  Blood pressure stable at 138/56. Weight is 246 pounds. Actually gained 7 pounds since May 2016. History of obstructive sleep apnea, restless leg syndrome, vitamin D deficiency, hyperlipidemia, discoid lupus, dependent edema, impaired glucose tolerance, metabolic syndrome.  Has come down with sinusitis symptoms recently. Has maxillary sinus pressure and congestion.    Review of Systems     Objective:   Physical Exam  Skin warm and dry. Nodes none. Neck supple without JVD thyromegaly carotid bruits. Chest clear. Cardiac exam regular rate and rhythm. Extremities without edema.      Assessment & Plan:  Impaired glucose tolerance-hemoglobin A1c improved from 6.3% in May to 6.1% nail.  Hyperlipidemia- She has a low HDL cholesterol. Lipid panel liver functions normal on statin medication.  Obesity- Needs to lose weight.  Acute maxillary sinusitis-treat with Biaxin 500 mg twice daily for 10 days  Metabolic syndrome  Plan: She'll return in 6 months for physical exam. In the meantime, should work on diet exercise and weight loss regimen. Continue same medications.

## 2015-08-27 NOTE — Patient Instructions (Signed)
Take Biaxin 500 mg twice daily for 10 days. Continue diet exercise and weight loss efforts. Continue same medications. Return in 6 months. It was a pleasure to see you today.

## 2015-09-13 ENCOUNTER — Ambulatory Visit (INDEPENDENT_AMBULATORY_CARE_PROVIDER_SITE_OTHER): Payer: 59 | Admitting: Internal Medicine

## 2015-09-13 ENCOUNTER — Encounter: Payer: Self-pay | Admitting: Internal Medicine

## 2015-09-13 ENCOUNTER — Other Ambulatory Visit: Payer: Self-pay

## 2015-09-13 VITALS — BP 108/80 | HR 96 | Temp 98.0°F | Resp 20 | Ht 59.0 in | Wt 240.0 lb

## 2015-09-13 DIAGNOSIS — R05 Cough: Secondary | ICD-10-CM | POA: Diagnosis not present

## 2015-09-13 DIAGNOSIS — B9689 Other specified bacterial agents as the cause of diseases classified elsewhere: Secondary | ICD-10-CM

## 2015-09-13 DIAGNOSIS — J019 Acute sinusitis, unspecified: Secondary | ICD-10-CM

## 2015-09-13 DIAGNOSIS — R059 Cough, unspecified: Secondary | ICD-10-CM

## 2015-09-13 DIAGNOSIS — R509 Fever, unspecified: Secondary | ICD-10-CM

## 2015-09-13 LAB — INFLUENZA A AND B AG, IMMUNOASSAY
Influenza A Antigen: NOT DETECTED
Influenza B Antigen: NOT DETECTED

## 2015-09-13 MED ORDER — BENZONATATE 100 MG PO CAPS: 200.0000 mg | ORAL_CAPSULE | Freq: Three times a day (TID) | ORAL | Status: DC | PRN

## 2015-09-13 MED ORDER — LEVOFLOXACIN 500 MG PO TABS: 500.0000 mg | ORAL_TABLET | Freq: Every day | ORAL | Status: DC

## 2015-09-13 MED ORDER — CEFTRIAXONE SODIUM 1 G IJ SOLR
1.0000 g | INTRAMUSCULAR | Status: AC
  Administered 2015-09-13: 1 g via INTRAMUSCULAR

## 2015-09-13 MED FILL — levoFLOXacin 500 MG TABS: 500 | 10 days supply | Qty: 10 | Fill #0

## 2015-09-13 MED FILL — BENZONATATE 100 MG CAPSULE: 100 | 10 days supply | Qty: 60 | Fill #0

## 2015-09-13 NOTE — Progress Notes (Signed)
   Subjective:    Patient ID: Connie Hall, female    DOB: 11-23-1965, 50 y.o.   MRN: [id number]  HPI Was here December 8th for 6 month recheck and had acute maxillary sinusitis treated with Biaxin. Never completely got better. Got some better but symptoms seem to worsen after the New Year's holidays. Lots of people in her office have been sick. Has had fatigue and malaise. Persistent cough and congestion. Was febrile at home. Afebrile here today. No vomiting or diarrhea. Has felt cold at times.  Review of Systems as above     Objective:   Physical Exam  Skin warm and dry. Nodes none. Pharynx very slightly injected. TMs slightly full. Neck is supple. Chest clear to auscultation without rales or wheezing      Assessment & Plan:  Acute maxillary sinusitis  Plan: Rocephin 1 g IM. Levaquin 500 milligrams daily for 10 days. Tessalon Perles 3 times daily as needed for cough. Rest and drink plenty of fluids.  Nasal flu swab taken and result is negative for influenza

## 2015-09-20 DIAGNOSIS — H52223 Regular astigmatism, bilateral: Secondary | ICD-10-CM | POA: Diagnosis not present

## 2015-09-20 DIAGNOSIS — H524 Presbyopia: Secondary | ICD-10-CM | POA: Diagnosis not present

## 2015-09-20 DIAGNOSIS — H5213 Myopia, bilateral: Secondary | ICD-10-CM | POA: Diagnosis not present

## 2015-09-24 ENCOUNTER — Encounter: Payer: Self-pay | Admitting: Internal Medicine

## 2015-09-24 NOTE — Patient Instructions (Addendum)
Rocephin 1 g IM. Tessalon Perles 3 times daily as needed for cough. Levaquin 500 milligrams daily for 10 days. Nasal rapid Flu test negative for influenza.

## 2015-10-03 ENCOUNTER — Other Ambulatory Visit: Payer: Self-pay | Admitting: Internal Medicine

## 2015-10-03 MED FILL — ATORVASTATIN 10 MG TABLET: 10 | 90 days supply | Qty: 90 | Fill #0

## 2015-10-03 MED FILL — rOPINIRole HCL 0.5 MG TABS: 0.5 | 90 days supply | Qty: 270 | Fill #1

## 2015-10-03 MED FILL — FLUTICASONE PROP 50 MCG SPR: 50 | 90 days supply | Qty: 48 | Fill #2

## 2015-10-25 DIAGNOSIS — H33321 Round hole, right eye: Secondary | ICD-10-CM | POA: Diagnosis not present

## 2015-10-25 DIAGNOSIS — H2513 Age-related nuclear cataract, bilateral: Secondary | ICD-10-CM | POA: Diagnosis not present

## 2015-10-27 ENCOUNTER — Encounter: Payer: Self-pay | Admitting: Podiatry

## 2015-10-27 ENCOUNTER — Ambulatory Visit (INDEPENDENT_AMBULATORY_CARE_PROVIDER_SITE_OTHER): Payer: 59 | Admitting: Podiatry

## 2015-10-27 DIAGNOSIS — B351 Tinea unguium: Secondary | ICD-10-CM | POA: Diagnosis not present

## 2015-10-27 MED ORDER — TERBINAFINE HCL 250 MG PO TABS: 250.0000 mg | ORAL_TABLET | Freq: Every day | ORAL | Status: DC

## 2015-10-27 MED FILL — TERBINAFINE HCL 250 MG TAB: 250 | 30 days supply | Qty: 30 | Fill #0

## 2015-10-27 NOTE — Progress Notes (Signed)
She presents today for follow-up of her Lamisil therapy and states that her toenails look much better.  Objective: Vital signs are stable she is alert and oriented 3. Nail plates appear to be growing out very nicely with exception of the medial border of the hallux left. There appears to be some type of dystrophy or possibly another organism that is resulting in a slower growing out on this one side.  Assessment: Well-healing onychomycosis long-term therapy with Lamisil.  Plan: At this point I started her on Lamisil 250 mg tablets #30 tablets one by mouth every other day and I will follow-up with her in 3 months.

## 2015-11-30 DIAGNOSIS — H33321 Round hole, right eye: Secondary | ICD-10-CM | POA: Diagnosis not present

## 2015-12-14 DIAGNOSIS — H33321 Round hole, right eye: Secondary | ICD-10-CM | POA: Diagnosis not present

## 2015-12-20 DIAGNOSIS — H04123 Dry eye syndrome of bilateral lacrimal glands: Secondary | ICD-10-CM | POA: Diagnosis not present

## 2015-12-20 DIAGNOSIS — H1850 Unspecified hereditary corneal dystrophies: Secondary | ICD-10-CM | POA: Diagnosis not present

## 2015-12-26 MED FILL — rOPINIRole HCL 0.5 MG TABS: 0.5 | 90 days supply | Qty: 270 | Fill #2

## 2015-12-26 MED FILL — FLUTICASONE PROP 50 MCG SPR: 50 | 90 days supply | Qty: 48 | Fill #3

## 2015-12-26 MED FILL — ATORVASTATIN 10 MG TABLET: 10 | 90 days supply | Qty: 90 | Fill #1

## 2016-01-02 DIAGNOSIS — G4733 Obstructive sleep apnea (adult) (pediatric): Secondary | ICD-10-CM | POA: Diagnosis not present

## 2016-02-02 ENCOUNTER — Encounter: Payer: Self-pay | Admitting: Podiatry

## 2016-02-02 ENCOUNTER — Ambulatory Visit (INDEPENDENT_AMBULATORY_CARE_PROVIDER_SITE_OTHER): Payer: 59

## 2016-02-02 ENCOUNTER — Ambulatory Visit (INDEPENDENT_AMBULATORY_CARE_PROVIDER_SITE_OTHER): Payer: 59 | Admitting: Podiatry

## 2016-02-02 ENCOUNTER — Other Ambulatory Visit: Payer: 59 | Admitting: Internal Medicine

## 2016-02-02 DIAGNOSIS — Z1329 Encounter for screening for other suspected endocrine disorder: Secondary | ICD-10-CM

## 2016-02-02 DIAGNOSIS — R7302 Impaired glucose tolerance (oral): Secondary | ICD-10-CM

## 2016-02-02 DIAGNOSIS — E785 Hyperlipidemia, unspecified: Secondary | ICD-10-CM

## 2016-02-02 DIAGNOSIS — E669 Obesity, unspecified: Secondary | ICD-10-CM

## 2016-02-02 DIAGNOSIS — M722 Plantar fascial fibromatosis: Secondary | ICD-10-CM

## 2016-02-02 DIAGNOSIS — Z130 Encounter for screening for diseases of the blood and blood-forming organs and certain disorders involving the immune mechanism: Secondary | ICD-10-CM | POA: Diagnosis not present

## 2016-02-02 DIAGNOSIS — Z0000 Encounter for general adult medical examination without abnormal findings: Secondary | ICD-10-CM | POA: Diagnosis not present

## 2016-02-02 DIAGNOSIS — E8881 Metabolic syndrome: Secondary | ICD-10-CM | POA: Diagnosis not present

## 2016-02-02 DIAGNOSIS — E119 Type 2 diabetes mellitus without complications: Secondary | ICD-10-CM

## 2016-02-02 DIAGNOSIS — B351 Tinea unguium: Secondary | ICD-10-CM | POA: Diagnosis not present

## 2016-02-02 DIAGNOSIS — Z1321 Encounter for screening for nutritional disorder: Secondary | ICD-10-CM

## 2016-02-02 NOTE — Progress Notes (Signed)
She presents today for follow-up of her Lamisil therapy she states that really don't think it has done anything. She states that while walking in [place] through the [place] she feels that she may have hurt her left foot around the heel. She states been bothering her now for about 2 weeks.  Objective: Vital signs are stable she is alert and oriented 3. Pulses are palpable. Neurologic sensorium is intact she has pain on palpation medial calcaneal tubercle of the left heel and just distal to that region. Radiographs taken today do not demonstrate any major osseous abnormalities other than soft tissue increase in density of the plantar fascia at its calcaneal insertion site and just distal to that area. Her nail plate hallux left being the worst nail. This does not demonstrate any clearing from the Lamisil therapy.  Assessment: Onychomycosis not rendered asymptomatic with Lamisil. Plantar fasciitis left foot.  Plan: I injected the left heel today with Kenalog and local anesthetic discussed appropriate shoe gear stretching exercises and ice therapy. I also recommended laser therapy for the toenails #1 and 5 bilaterally. For the next 6 months.

## 2016-02-03 LAB — COMPLETE METABOLIC PANEL WITH GFR
ALBUMIN: 3.8 g/dL (ref 3.6–5.1)
ALK PHOS: 47 U/L (ref 33–115)
ALT: 13 U/L (ref 6–29)
AST: 12 U/L (ref 10–35)
BILIRUBIN TOTAL: 0.3 mg/dL (ref 0.2–1.2)
BUN: 11 mg/dL (ref 7–25)
CALCIUM: 8.7 mg/dL (ref 8.6–10.2)
CO2: 22 mmol/L (ref 20–31)
CREATININE: 0.82 mg/dL (ref 0.50–1.10)
Chloride: 106 mmol/L (ref 98–110)
GFR, Est African American: >89 mL/min (ref >=60)
GFR, Est Non African American: 85 mL/min (ref >=60)
Glucose, Bld: 106 mg/dL — ABNORMAL HIGH (ref 65–99)
Potassium: 4.7 mmol/L (ref 3.5–5.3)
Sodium: 139 mmol/L (ref 135–146)
TOTAL PROTEIN: 6.8 g/dL (ref 6.1–8.1)

## 2016-02-03 LAB — LIPID PANEL
Cholesterol: 148 mg/dL (ref 125–200)
HDL: 31 mg/dL — AB (ref >=46)
LDL CALC: 82 mg/dL (ref <130)
Total CHOL/HDL Ratio: 4.8 Ratio (ref <=5.0)
Triglycerides: 173 mg/dL — ABNORMAL HIGH (ref <150)
VLDL: 35 mg/dL — ABNORMAL HIGH (ref <30)

## 2016-02-03 LAB — TSH: TSH: 1.44 mIU/L

## 2016-02-03 LAB — CBC WITH DIFFERENTIAL/PLATELET
BASOS PCT: 0 %
Basophils Absolute: 0 cells/uL (ref 0–200)
EOS PCT: 2 %
Eosinophils Absolute: 174 cells/uL (ref 15–500)
HEMATOCRIT: 39.2 % (ref 35.0–45.0)
HEMOGLOBIN: 12.6 g/dL (ref 11.7–15.5)
LYMPHS ABS: 1914 {cells}/uL (ref 850–3900)
Lymphocytes Relative: 22 %
MCH: 28.1 pg (ref 27.0–33.0)
MCHC: 32.1 g/dL (ref 32.0–36.0)
MCV: 87.3 fL (ref 80.0–100.0)
MONOS PCT: 4 %
MPV: 9.1 fL (ref 7.5–12.5)
Monocytes Absolute: 348 cells/uL (ref 200–950)
NEUTROS ABS: 6264 {cells}/uL (ref 1500–7800)
Neutrophils Relative %: 72 %
Platelets: 332 10*3/uL (ref 140–400)
RBC: 4.49 MIL/uL (ref 3.80–5.10)
RDW: 14 % (ref 11.0–15.0)
WBC: 8.7 10*3/uL (ref 3.8–10.8)

## 2016-02-03 LAB — MICROALBUMIN, URINE: MICROALB UR: 0.6 mg/dL

## 2016-02-03 LAB — HEMOGLOBIN A1C
HEMOGLOBIN A1C: 6.4 % — AB (ref <5.7)
Mean Plasma Glucose: 137 mg/dL

## 2016-02-03 LAB — VITAMIN D 25 HYDROXY (VIT D DEFICIENCY, FRACTURES): VIT D 25 HYDROXY: 40 ng/mL (ref 30–100)

## 2016-02-07 ENCOUNTER — Encounter: Payer: Self-pay | Admitting: Internal Medicine

## 2016-02-07 ENCOUNTER — Other Ambulatory Visit (HOSPITAL_COMMUNITY)
Admission: RE | Admit: 2016-02-07 | Discharge: 2016-02-07 | Disposition: A | Discharge status: Home / Self Care | Payer: 59 | Source: Ambulatory Visit | Attending: Internal Medicine | Admitting: Internal Medicine

## 2016-02-07 ENCOUNTER — Ambulatory Visit (INDEPENDENT_AMBULATORY_CARE_PROVIDER_SITE_OTHER): Payer: 59 | Admitting: Internal Medicine

## 2016-02-07 VITALS — BP 136/84 | HR 98 | Temp 98.5°F | Resp 18 | Ht 59.0 in | Wt 242.0 lb

## 2016-02-07 DIAGNOSIS — J342 Deviated nasal septum: Secondary | ICD-10-CM

## 2016-02-07 DIAGNOSIS — Z01419 Encounter for gynecological examination (general) (routine) without abnormal findings: Secondary | ICD-10-CM | POA: Insufficient documentation

## 2016-02-07 DIAGNOSIS — E119 Type 2 diabetes mellitus without complications: Secondary | ICD-10-CM

## 2016-02-07 DIAGNOSIS — Z124 Encounter for screening for malignant neoplasm of cervix: Secondary | ICD-10-CM | POA: Diagnosis not present

## 2016-02-07 DIAGNOSIS — G4733 Obstructive sleep apnea (adult) (pediatric): Secondary | ICD-10-CM | POA: Diagnosis not present

## 2016-02-07 DIAGNOSIS — G2581 Restless legs syndrome: Secondary | ICD-10-CM

## 2016-02-07 DIAGNOSIS — E785 Hyperlipidemia, unspecified: Secondary | ICD-10-CM

## 2016-02-07 DIAGNOSIS — R609 Edema, unspecified: Secondary | ICD-10-CM

## 2016-02-07 DIAGNOSIS — Z0000 Encounter for general adult medical examination without abnormal findings: Secondary | ICD-10-CM

## 2016-02-07 NOTE — Progress Notes (Signed)
Subjective:    Patient ID: Connie Hall, female    DOB: [birth year], 50 y.o.   MRN: [id number]  HPI  50 year old White Female for health maintenance exam and evaluation of medical issues. New developments over the past year include plantar fasciitis and holes in retina. Now being seen by cornea specialist from [facility] in [place]. He is reproduces May 2016. She has obesity, hyperlipidemia, restless leg syndrome, controlled type 2 diabetes without complication, obstructive sleep apnea, dependent edema, deviated nasal septum, history of vitamin D deficiency and metabolic syndrome.  Pain medications cause nausea. She is allergic to Sulfa- it causes hives.  Tetanus immunization given 2012 in the emergency department due to laceration right great toe when she stepped on a piece of tin.  Gets influenza immunization through employment.  History of deviated nasal septum. Has seen ENT physician but has not had it repaired.  Weight has been a problem for her for number of years. Unable to lose weight. Has tried very [place] exercise regimens.  Past medical history: Left shoulder arthropathy 2009, right medial epicondylitis August 2009, right carpal tunnel syndrome September 2009. Patient had sebaceous cyst removed from her back by Dr. Clarise Cruz August 2009. History of obstructive sleep apnea and has seen Dr. claimants. Has C Pap machine.  Social history: She is single, never married, does not smoke. Seldom consumes alcohol. Lives alone. Has a college degree. Family lives in [place]. 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  Family history: Father with history of hypertension, stroke, coronary artery disease, kidney stones and diabetes. Mother with history of osteoporosis. No brothers. 4 sisters 2 of whom have diabetes. 2 sisters have sleep apnea and 3 sisters are overweight. One sister is in overall good health.        Review of  Systems  Constitutional: Negative.   All other systems reviewed and are negative.  no new issues     Objective:  Physical Exam  Constitutional: She is oriented to person, place, and time. She appears well-developed and well-nourished. No distress.  HENT:  Head: Normocephalic and atraumatic.  Right Ear: External ear normal.  Left Ear: External ear normal.  Mouth/Throat: Oropharynx is clear and moist. No oropharyngeal exudate.  Eyes: Conjunctivae and EOM are normal. Pupils are equal, round, and reactive to light. Right eye exhibits no discharge. Left eye exhibits no discharge. No scleral icterus.  Neck: Neck supple. No JVD present. No thyromegaly present.  Cardiovascular: Normal rate, regular rhythm, normal heart sounds and intact distal pulses.   No murmur heard. Pulmonary/Chest: Effort normal and breath sounds normal. No respiratory distress. She has no wheezes. She has no rales.  Abdominal: Soft. Bowel sounds are normal. She exhibits no distension and no mass. There is no tenderness. There is no rebound and no guarding.  Genitourinary:  Pap taken. Last Pap 2014. Bimanual normal.  Musculoskeletal: She exhibits no edema.  Lymphadenopathy:    She has no cervical adenopathy.  Neurological: She is alert and oriented to person, place, and time. She has normal reflexes. No cranial nerve deficit. Coordination normal.  Skin: Skin is warm and dry. No rash noted. She is not diaphoretic.  Psychiatric: She has a normal mood and affect. Her behavior is normal. Judgment and thought content normal.  Vitals reviewed.         Assessment & Plan:   history of type 2 diabetes mellitus. Hemoglobin A1c has been in the 6.3% range One  year ago and improved to 6.1%. Now back at 6.4%.[name] Has been referred to [facility] last year.  Obesity-has been referred to [facility]  Hyperlipidemia-triglycerides have increased presently 30 points  Dependent edema  Deviated nasal septum  Vitamin D  deficiency  History of discoid lupus  History of restless leg syndrome treated with Requip  Plan: Once again encouraged diet exercise and weight loss. Continue same medications return in 6 months.

## 2016-02-09 LAB — CYTOLOGY - PAP

## 2016-02-14 DIAGNOSIS — H2513 Age-related nuclear cataract, bilateral: Secondary | ICD-10-CM | POA: Diagnosis not present

## 2016-02-14 DIAGNOSIS — H18003 Unspecified corneal deposit, bilateral: Secondary | ICD-10-CM | POA: Diagnosis not present

## 2016-02-14 DIAGNOSIS — H1851 Endothelial corneal dystrophy: Secondary | ICD-10-CM | POA: Diagnosis not present

## 2016-02-14 DIAGNOSIS — H35343 Macular cyst, hole, or pseudohole, bilateral: Secondary | ICD-10-CM | POA: Diagnosis not present

## 2016-02-16 ENCOUNTER — Ambulatory Visit: Payer: 59

## 2016-02-16 DIAGNOSIS — B351 Tinea unguium: Secondary | ICD-10-CM

## 2016-02-18 ENCOUNTER — Other Ambulatory Visit: Payer: Self-pay | Admitting: Internal Medicine

## 2016-02-18 NOTE — Progress Notes (Signed)
Pt presents with mycotic infection of nails Lt 1,5 and Rt 1,4,5  All other systems are negative   Al affected nails were mechanically debrided, Laser therapy administered to affected nails and tolerated well. All safety precautions were in place, re-appointed in 1 month for 2 of 4 treatment and we will continue to evaluate whether further treatment is needed as the nail progresses

## 2016-02-22 ENCOUNTER — Encounter: Payer: Self-pay | Admitting: Internal Medicine

## 2016-02-23 NOTE — Patient Instructions (Addendum)
Please work on diet exercise and weight loss  return in 6 months. Continue same meds. It was a pleasure to see you today.

## 2016-03-04 ENCOUNTER — Ambulatory Visit (HOSPITAL_COMMUNITY)
Admission: EM | Admit: 2016-03-04 | Discharge: 2016-03-04 | Disposition: A | Discharge status: Home / Self Care | Payer: 59 | Attending: Family Medicine | Admitting: Family Medicine

## 2016-03-04 ENCOUNTER — Encounter (HOSPITAL_COMMUNITY): Payer: Self-pay | Admitting: *Deleted

## 2016-03-04 DIAGNOSIS — Z23 Encounter for immunization: Secondary | ICD-10-CM

## 2016-03-04 DIAGNOSIS — S61432A Puncture wound without foreign body of left hand, initial encounter: Secondary | ICD-10-CM

## 2016-03-04 MED ORDER — TETANUS-DIPHTH-ACELL PERTUSSIS 5-2.5-18.5 LF-MCG/0.5 IM SUSP
0.5000 mL | Freq: Once | INTRAMUSCULAR | Status: AC
  Administered 2016-03-04: 0.5 mL via INTRAMUSCULAR

## 2016-03-04 MED ORDER — TETANUS-DIPHTH-ACELL PERTUSSIS 5-2.5-18.5 LF-MCG/0.5 IM SUSP
INTRAMUSCULAR | Status: AC
  Filled 2016-03-04: qty 0.5

## 2016-03-04 NOTE — ED Notes (Signed)
Pt  Reports   She   Sustained  A  Puncture      Wound to  Her  l  Hand 9 days  Ago   From  Chicken  Wire     She  Has  Pain  In the  [place] of  Her  l  Hand

## 2016-03-04 NOTE — ED Provider Notes (Signed)
CSN: [id number]     Arrival date & time 03/04/16  1540 History   First MD Initiated Contact with Patient 03/04/16 1620     Chief Complaint  Patient presents with  . Puncture Wound   (Consider location/radiation/quality/duration/timing/severity/associated sxs/prior Treatment) Patient is a 50 y.o. female presenting with hand injury. The history is provided by the patient.  Hand Injury Location:  Hand Time since incident:  9 days Injury: yes   Mechanism of injury comment:  Cleaning yard , wearing gloves, superficial puncture from chicken wire to left palm., needs tetanus. Hand location:  L palm Pain details:    Quality:  Aching   Radiates to:  Does not radiate   Severity:  Mild Chronicity:  New Dislocation: no   Tetanus status:  Out of date Prior injury to area:  No Relieved by:  None tried Worsened by:  Nothing tried Ineffective treatments:  None tried   Past Medical History  Diagnosis Date  . History of discoid lupus erythematosus   . Restless legs syndrome (RLS)   . Vitamin D deficiency   . Hyperlipidemia   . DM (diabetes mellitus) ([place])   . Deviated nasal septum    Past Surgical History  Procedure Laterality Date  . Sebaceous cyst removal, back  03/2008  . Rotator cuff repair      left   Family History  Problem Relation Age of Onset  . Hypertension Father   . Stroke Father   . Heart disease Father   . Diabetes Father   . Pancreatic cancer Father    Social History  Substance Use Topics  . Smoking status: Former Smoker -- 1.00 packs/day for 15 years    Types: Cigarettes    Quit date: 08/27/1998  . Smokeless tobacco: Never Used  . Alcohol Use: Yes     Comment: rare   OB History    No data available     Review of Systems  Constitutional: Negative.   Musculoskeletal: Positive for myalgias. Negative for joint swelling and gait problem.  Skin: Negative.   All other systems reviewed and are negative.   Allergies  Sulfonamide derivatives  Home  Medications   Prior to Admission medications   Medication Sig Start Date End Date Taking? Authorizing Provider  ALLEGRA-D ALLERGY & CONGESTION 60-120 MG 12 hr tablet TAKE 1 TABLET BY MOUTH TWICE A DAY 02/20/16   Elby Showers, MD  atorvastatin (LIPITOR) 10 MG tablet TAKE 1 TABLET BY MOUTH DAILY. 10/03/15   Elby Showers, MD  Cholecalciferol (VITAMIN D) 1000 UNITS capsule Take 1,000 Units by mouth daily.  11/12/08   Historical Provider, MD  fluticasone (FLONASE) 50 MCG/ACT nasal spray USE 2 SPRAYS IN EACH NOSTRIL ONCE DAILY 04/05/15   Elby Showers, MD  rOPINIRole (REQUIP) 0.5 MG tablet TAKE ONE TABLET BY MOUTH IN AFTERNOON AND 2 BY MOUTH AT BEDTIME 07/01/15   Elby Showers, MD   Meds Ordered and Administered this Visit   Medications  Tdap (BOOSTRIX) injection 0.5 mL (not administered)    BP 96/57 mmHg  Pulse 114  Temp(Src) 99.6 F (37.6 C) (Oral)  Resp 12  SpO2 95%  LMP 01/30/2016 No data found.   Physical Exam  Constitutional: She is oriented to person, place, and time. She appears well-developed and well-nourished. No distress.  Musculoskeletal: Normal range of motion. She exhibits no tenderness.  Neurological: She is alert and oriented to person, place, and time.  Skin: Skin is warm and dry. No erythema.  No visible injury to left hand, full rom.  Nursing note and vitals reviewed.   ED Course  Procedures (including critical care time)  Labs Review Labs Reviewed - No data to display  Imaging Review No results found.   Visual Acuity Review  Right Eye Distance:   Left Eye Distance:   Bilateral Distance:    Right Eye Near:   Left Eye Near:    Bilateral Near:         MDM   1. Puncture wound, hand, left, initial encounter        Billy Fischer, MD 03/04/16 [phone]

## 2016-03-04 NOTE — Discharge Instructions (Signed)
Soak in warm water and use ibuprofen as needed, see orthopedist if further problems

## 2016-03-22 ENCOUNTER — Other Ambulatory Visit: Payer: 59

## 2016-03-29 ENCOUNTER — Ambulatory Visit (INDEPENDENT_AMBULATORY_CARE_PROVIDER_SITE_OTHER): Payer: 59 | Admitting: Podiatry

## 2016-03-29 ENCOUNTER — Encounter: Payer: Self-pay | Admitting: Podiatry

## 2016-03-29 DIAGNOSIS — B351 Tinea unguium: Secondary | ICD-10-CM

## 2016-03-29 MED ORDER — NEOMYCIN-POLYMYXIN-HC 1 % OT SOLN: OTIC | 1 refills | Status: DC

## 2016-03-29 MED ORDER — NAFTIFINE HCL 2 % EX CREA: 1.0000 [drp] | TOPICAL_CREAM | CUTANEOUS | 2 refills | Status: DC

## 2016-03-29 MED FILL — ATORVASTATIN 10 MG TABLET: 10 | 90 days supply | Qty: 90 | Fill #2

## 2016-03-29 NOTE — Patient Instructions (Signed)

## 2016-03-30 ENCOUNTER — Telehealth: Payer: Self-pay | Admitting: *Deleted

## 2016-03-30 ENCOUNTER — Other Ambulatory Visit: Payer: Self-pay | Admitting: Internal Medicine

## 2016-03-30 MED ORDER — NAFTIFINE HCL 2 % EX CREA: 1.0000 [drp] | TOPICAL_CREAM | CUTANEOUS | 2 refills | Status: DC

## 2016-03-30 MED ORDER — NEOMYCIN-POLYMYXIN-HC 1 % OT SOLN: OTIC | 1 refills | Status: DC

## 2016-03-30 MED FILL — ECONAZOLE NITRATE 1% CREAM: 1 | 30 days supply | Qty: 30 | Fill #0

## 2016-03-30 MED FILL — NAFTIFINE HCL 2% CREAM: 2 | 10 days supply | Qty: 60 | Fill #0

## 2016-03-30 MED FILL — NEO/POLYMYXIN/HC EAR SOLN: 3.5-10000-1 | 10 days supply | Qty: 10 | Fill #0

## 2016-03-30 NOTE — Progress Notes (Signed)
She presents today and states that her left great toenail is coming off. She states that after laser therapy she was somewhat disappointed to notice that the toenail started to get worse rather than better.  Objective: Vital signs are stable she is alert and oriented 3. Pulses are palpable. Neurologic sensorium is intact. Deep tendon reflexes are intact. Muscle strength +5 over 5 dorsiflexion plantar flexors and inverters and everters all just musculature is intact. Cutaneous evaluation demonstrates supple well-hydrated cutis it appears that her onychomycosis along the tibial border seems to be getting worse and a portion of the nail is loose and a avulsed.  Assessment: Nail dystrophy hallux left with onychomycosis.  Plan: The nail plate was removed today in total temporarily after local anesthesia was diminished and she tolerated this procedure well. She was start soaking twice a day in Epsom salts and warm water or Betadine in warm water tomorrow and apply Cortisporin Otic as written twice daily after soaking. This prescription was sent over electronically. We also prescribed Naftin cream to be applied once the nail has started growing out through the bed. Hopefully this will prevent fungal infection.

## 2016-03-30 NOTE — Telephone Encounter (Signed)
Pt states the medication from yesterday is not at the [facility]. 03/29/2016 prescriptions escribed to [facility].

## 2016-03-30 NOTE — Telephone Encounter (Signed)
[place] state Naftin sig. Says 1 drop 1 day.  I called and left message it is one drop daily to affected area not once.

## 2016-04-19 ENCOUNTER — Ambulatory Visit (INDEPENDENT_AMBULATORY_CARE_PROVIDER_SITE_OTHER): Payer: 59

## 2016-04-19 DIAGNOSIS — L603 Nail dystrophy: Secondary | ICD-10-CM

## 2016-04-19 DIAGNOSIS — B351 Tinea unguium: Secondary | ICD-10-CM

## 2016-04-19 NOTE — Progress Notes (Signed)
Pt presents for Lt hallux nail check, post nail avulsion done on 03/29/16. No redness, no swelling, no drainage and patient denies pain. Advise to continue with topical antifungal cream on nails that still look to have distal fungal elements. Overal nails 1-5 bilateral are cleared 80-90% New nail growth on lt hallux nail at 1/4 inch. Advised her to follow up if any acute changes occur.

## 2016-04-19 NOTE — Patient Instructions (Signed)

## 2016-05-03 ENCOUNTER — Telehealth: Payer: 59 | Admitting: Physician Assistant

## 2016-05-03 DIAGNOSIS — J019 Acute sinusitis, unspecified: Secondary | ICD-10-CM | POA: Diagnosis not present

## 2016-05-03 MED ORDER — DOXYCYCLINE HYCLATE 100 MG PO CAPS: 100.0000 mg | ORAL_CAPSULE | Freq: Two times a day (BID) | ORAL | 0 refills | Status: DC

## 2016-05-03 MED FILL — DOXYCYCLINE HYCLATE 100 MG: 100 | 10 days supply | Qty: 20 | Fill #0

## 2016-05-03 NOTE — Progress Notes (Signed)

## 2016-05-04 ENCOUNTER — Telehealth: Payer: Self-pay

## 2016-05-04 NOTE — Telephone Encounter (Signed)
Spoke to patient. Explained spoke w/ Dr. Renold Genta. Recommended taking Doxycycline as prescribed. Patient verbalized understanding.

## 2016-05-04 NOTE — Telephone Encounter (Signed)
Patient called in stating she has a sinus infection. Was dx'd through e-visit by Bennetta Laos on 05/03/16. PA rx'd Doxycycline 100mg  x10 days on 05/03/16. Patient would like to know if Dr. Renold Genta thinks med will work, or does she need stronger med. Advised patient could come in for an OV if not better after taking course of med. Patient says she would rather not wait, would like Dr. Verlene Mayer recommendation.

## 2016-05-18 DIAGNOSIS — G4733 Obstructive sleep apnea (adult) (pediatric): Secondary | ICD-10-CM | POA: Diagnosis not present

## 2016-06-01 DIAGNOSIS — G4733 Obstructive sleep apnea (adult) (pediatric): Secondary | ICD-10-CM | POA: Diagnosis not present

## 2016-06-13 ENCOUNTER — Ambulatory Visit: Payer: 59 | Admitting: Adult Health

## 2016-07-02 ENCOUNTER — Other Ambulatory Visit: Payer: Self-pay | Admitting: Internal Medicine

## 2016-07-02 MED FILL — rOPINIRole HCL 0.5 MG TABS: 0.5 | 90 days supply | Qty: 270 | Fill #0

## 2016-07-02 MED FILL — ATORVASTATIN 10 MG TABLET: 10 | 90 days supply | Qty: 90 | Fill #3

## 2016-07-02 MED FILL — FLUTICASONE PROP 50 MCG SPR: 50 | 90 days supply | Qty: 48 | Fill #0

## 2016-07-03 ENCOUNTER — Ambulatory Visit: Payer: 59 | Admitting: Pulmonary Disease

## 2016-07-10 ENCOUNTER — Telehealth: Payer: Self-pay

## 2016-07-10 NOTE — Telephone Encounter (Signed)
LMOM that pt. Either bring SD or the machine for download for her appt. tomorrow

## 2016-07-11 ENCOUNTER — Ambulatory Visit (INDEPENDENT_AMBULATORY_CARE_PROVIDER_SITE_OTHER): Payer: 59 | Admitting: Pulmonary Disease

## 2016-07-11 ENCOUNTER — Encounter: Payer: Self-pay | Admitting: Pulmonary Disease

## 2016-07-11 DIAGNOSIS — E6609 Other obesity due to excess calories: Secondary | ICD-10-CM | POA: Diagnosis not present

## 2016-07-11 DIAGNOSIS — G4733 Obstructive sleep apnea (adult) (pediatric): Secondary | ICD-10-CM | POA: Diagnosis not present

## 2016-07-11 NOTE — Assessment & Plan Note (Signed)
Wt loss encouraged  

## 2016-07-11 NOTE — Patient Instructions (Signed)
CPAP 13 cm is effective Call for prescription when you need a new home unit

## 2016-07-11 NOTE — Progress Notes (Signed)
   Subjective:    Patient ID: Connie Hall, female    DOB: 1966/02/22, 50 y.o.   MRN: [id number]  HPI  Former [employer] Patient with severe OSA maintained on  cpap 13cm.   07/11/2016  Chief Complaint  Patient presents with  . Follow-up    pt wears cpap avg 8hr nightly, feels pressure & mask is okay. [place]   She has 2 CPAP machines and she uses one for travel Her first machine was obtained in 2011 and she paid For the second machine by herself.  She would like to get a new many travel machine She is very well compliant with her CPAP. Her travel machine is set at 9 cm and seems to control even some download her home machine is now set at 13 cm-she increase the pressure herself said she wanted more air-and seems to be very effective, no residual events in excellent compliance average of 9 hours per night   She had great improvement in her daytime somnolence and alertness, snoring has stopped. She claims good compliance with her machine RLS well controlled on requip q hs - takes addn dose prn Mask is okay pressure is adequate, she has been able to get CPAP supplies on time- [facility]  NPSG 2011:  AHI 44/hr download 05/2014:  Great compliance, averaging 7-8 hrs a night, AHI well controlled  Download 04/2015 >> good usage, no residuals  Review of Systems neg for any significant sore throat, dysphagia, itching, sneezing, nasal congestion or excess/ purulent secretions, fever, chills, sweats, unintended wt loss, pleuritic or exertional cp, hempoptysis, orthopnea pnd or change in chronic leg swelling. Also denies presyncope, palpitations, heartburn, abdominal pain, nausea, vomiting, diarrhea or change in bowel or urinary habits, dysuria,hematuria, rash, arthralgias, visual complaints, headache, numbness weakness or ataxia.     Objective:   Physical Exam  Gen. Pleasant, obese, in no distress ENT - class 3 airway, no post nasal drip Neck: No JVD, no thyromegaly, no carotid bruits Lungs: no use  of accessory muscles, no dullness to percussion, decreased without rales or rhonchi  Cardiovascular: Rhythm regular, heart sounds  normal, no murmurs or gallops, no peripheral edema Musculoskeletal: No deformities, no cyanosis or clubbing , no tremors       Assessment & Plan:

## 2016-07-11 NOTE — Assessment & Plan Note (Signed)
CPAP 13 cm is effective Prescription given today for her to get a new travel machine (mini) Call for prescription when you need a new home unit  Weight loss encouraged, compliance with goal of at least 4-6 hrs every night is the expectation. Advised against medications with sedative side effects Cautioned against driving when sleepy - understanding that sleepiness will vary on a day to day basis

## 2016-07-26 ENCOUNTER — Other Ambulatory Visit: Payer: 59 | Admitting: Internal Medicine

## 2016-07-26 DIAGNOSIS — E119 Type 2 diabetes mellitus without complications: Secondary | ICD-10-CM | POA: Diagnosis not present

## 2016-07-26 DIAGNOSIS — E785 Hyperlipidemia, unspecified: Secondary | ICD-10-CM | POA: Diagnosis not present

## 2016-07-26 LAB — HEPATIC FUNCTION PANEL
ALBUMIN: 4.1 g/dL (ref 3.6–5.1)
ALK PHOS: 41 U/L (ref 33–115)
ALT: 12 U/L (ref 6–29)
AST: 10 U/L (ref 10–35)
BILIRUBIN DIRECT: 0.1 mg/dL (ref <=0.2)
BILIRUBIN TOTAL: 0.4 mg/dL (ref 0.2–1.2)
Indirect Bilirubin: 0.3 mg/dL (ref 0.2–1.2)
Total Protein: 7 g/dL (ref 6.1–8.1)

## 2016-07-26 LAB — LIPID PANEL
CHOL/HDL RATIO: 4.7 ratio (ref <5.0)
Cholesterol: 135 mg/dL (ref <200)
HDL: 29 mg/dL — AB (ref >50)
LDL CALC: 66 mg/dL (ref <100)
Triglycerides: 198 mg/dL — ABNORMAL HIGH (ref <150)
VLDL: 40 mg/dL — ABNORMAL HIGH (ref <30)

## 2016-07-27 LAB — HEMOGLOBIN A1C
HEMOGLOBIN A1C: 6.1 % — AB (ref <5.7)
Mean Plasma Glucose: 128 mg/dL

## 2016-07-31 ENCOUNTER — Encounter: Payer: Self-pay | Admitting: Internal Medicine

## 2016-07-31 ENCOUNTER — Ambulatory Visit (INDEPENDENT_AMBULATORY_CARE_PROVIDER_SITE_OTHER): Payer: 59 | Admitting: Internal Medicine

## 2016-07-31 VITALS — BP 128/76 | HR 88 | Temp 98.5°F | Ht 59.0 in | Wt 239.0 lb

## 2016-07-31 DIAGNOSIS — G4733 Obstructive sleep apnea (adult) (pediatric): Secondary | ICD-10-CM | POA: Diagnosis not present

## 2016-07-31 DIAGNOSIS — E7849 Other hyperlipidemia: Secondary | ICD-10-CM

## 2016-07-31 DIAGNOSIS — E784 Other hyperlipidemia: Secondary | ICD-10-CM | POA: Diagnosis not present

## 2016-07-31 DIAGNOSIS — R7302 Impaired glucose tolerance (oral): Secondary | ICD-10-CM | POA: Diagnosis not present

## 2016-08-10 DIAGNOSIS — G4733 Obstructive sleep apnea (adult) (pediatric): Secondary | ICD-10-CM | POA: Diagnosis not present

## 2016-08-13 ENCOUNTER — Encounter: Payer: Self-pay | Admitting: Oncology

## 2016-08-26 NOTE — Patient Instructions (Signed)
Continue to work on diet exercise and weight loss. Continue with [employer]. Am pleased with glucose control. It was pleasure to see you today. Return in 6 months.

## 2016-08-26 NOTE — Progress Notes (Signed)
   Subjective:    Patient ID: Connie Hall, female    DOB: 1966/04/10, 50 y.o.   MRN: [id number]  HPI 50 year old Female in today for follow-up on impaired glucose tolerance and hyperlipidemia. Continues to go to [employer] and is making slow progress. Has been diagnosed with sleep apnea.  Triglycerides have increased from 173-198. However LDL cholesterol has decreased from 82-66. Total cholesterol was 148 6 months ago and is now 135. Liver functions are normal. Hemoglobin A1c has improved from 6.4% 6 months ago to 6.1%.    Review of Systems see above     Objective:   Physical Exam Skin warm and dry. Nodes none. Neck is supple without JVD thyromegaly or carotid bruits. Chest clear to auscultation. Cardiac exam regular rate and rhythm. Extremities without edema.       Assessment & Plan:

## 2016-10-01 ENCOUNTER — Other Ambulatory Visit: Payer: Self-pay | Admitting: Internal Medicine

## 2016-10-01 ENCOUNTER — Encounter: Payer: Self-pay | Admitting: Internal Medicine

## 2016-10-01 ENCOUNTER — Ambulatory Visit (INDEPENDENT_AMBULATORY_CARE_PROVIDER_SITE_OTHER): Payer: 59 | Admitting: Internal Medicine

## 2016-10-01 VITALS — BP 150/100 | HR 121 | Temp 100.7°F | Wt 233.0 lb

## 2016-10-01 DIAGNOSIS — J22 Unspecified acute lower respiratory infection: Secondary | ICD-10-CM | POA: Diagnosis not present

## 2016-10-01 DIAGNOSIS — J111 Influenza due to unidentified influenza virus with other respiratory manifestations: Secondary | ICD-10-CM

## 2016-10-01 DIAGNOSIS — R509 Fever, unspecified: Secondary | ICD-10-CM

## 2016-10-01 DIAGNOSIS — R6883 Chills (without fever): Secondary | ICD-10-CM

## 2016-10-01 DIAGNOSIS — R52 Pain, unspecified: Secondary | ICD-10-CM

## 2016-10-01 LAB — INFLUENZA A AND B AG, IMMUNOASSAY
Influenza A Antigen: NOT DETECTED
Influenza B Antigen: NOT DETECTED

## 2016-10-01 MED ORDER — HYDROCODONE-HOMATROPINE 5-1.5 MG/5ML PO SYRP: 5.0000 mL | ORAL_SOLUTION | Freq: Three times a day (TID) | ORAL | 0 refills | Status: DC | PRN

## 2016-10-01 MED ORDER — OSELTAMIVIR PHOSPHATE 75 MG PO CAPS: 75.0000 mg | ORAL_CAPSULE | Freq: Two times a day (BID) | ORAL | 0 refills | Status: DC

## 2016-10-01 MED ORDER — AZITHROMYCIN 250 MG PO TABS: ORAL_TABLET | ORAL | 0 refills | Status: DC

## 2016-10-01 MED FILL — OSELTAMIVIR PHOS 75 MG CAP: 75 | 5 days supply | Qty: 10 | Fill #0

## 2016-10-01 MED FILL — AZITHROMYCIN 250 MG TABLET: 250 | 5 days supply | Qty: 6 | Fill #0

## 2016-10-01 MED FILL — HYDROCODONE-HOMATROPINE SYR: 5-1.5 | 8 days supply | Qty: 120 | Fill #0

## 2016-10-01 MED FILL — ATORVASTATIN 10 MG TABLET: 10 | 90 days supply | Qty: 90 | Fill #0

## 2016-10-01 MED FILL — rOPINIRole HCL 0.5 MG TABS: 0.5 | 90 days supply | Qty: 270 | Fill #1

## 2016-10-01 NOTE — Patient Instructions (Signed)
Tamiflu 75 mg twice daily for 5 days. Zithromax Z-PAK take as directed. Hycodan every 8 hours as needed for cough and myalgias. Tylenol or Advil for fever. Rest and drink plenty of fluids. Do not return to work until afebrile for 48 hours.

## 2016-10-01 NOTE — Progress Notes (Signed)
   Subjective:    Patient ID: Connie Hall, female    DOB: 1966-02-14, 51 y.o.   MRN: [id number]  HPI Onset yesterday of malaise, fatigue, fever,myalgias and chills.She was out of town in [place] on Thursday, February 1 a conference. Likely came down with flu there. Did take flu vaccine through employment in October. Has developed cough that is slightly productive.    Review of Systems see above     Objective:   Physical Exam Is coughing in the office. TMs are dull bilaterally. Pharynx slightly injected. Rapid flu test pending. Neck is supple. Chest clear to auscultation without rales or wheezing. Skin is warm and dry. She is moving slowly about the office.       Assessment & Plan:  Influenza  Bronchitis  Plan: Zithromax Z-PAK take 2 tablets day one followed by 1 tablet days 2 through 5. Hycodan 1 teaspoon by mouth every 6-8 hours when necessary cough and myalgias. Tylenol or Advil as needed for fever. Tamiflu 75 mg twice daily for 5 days.

## 2016-10-05 DIAGNOSIS — H52223 Regular astigmatism, bilateral: Secondary | ICD-10-CM | POA: Diagnosis not present

## 2016-10-05 DIAGNOSIS — H524 Presbyopia: Secondary | ICD-10-CM | POA: Diagnosis not present

## 2016-10-05 DIAGNOSIS — H5213 Myopia, bilateral: Secondary | ICD-10-CM | POA: Diagnosis not present

## 2016-10-08 ENCOUNTER — Telehealth: Payer: Self-pay | Admitting: Oncology

## 2016-10-08 ENCOUNTER — Ambulatory Visit (HOSPITAL_BASED_OUTPATIENT_CLINIC_OR_DEPARTMENT_OTHER): Payer: 59 | Admitting: Oncology

## 2016-10-08 VITALS — BP 139/59 | HR 92 | Temp 98.9°F | Resp 18 | Ht 59.0 in | Wt 239.0 lb

## 2016-10-08 DIAGNOSIS — G2581 Restless legs syndrome: Secondary | ICD-10-CM

## 2016-10-08 DIAGNOSIS — Z800 Family history of malignant neoplasm of digestive organs: Secondary | ICD-10-CM | POA: Diagnosis not present

## 2016-10-08 DIAGNOSIS — E785 Hyperlipidemia, unspecified: Secondary | ICD-10-CM | POA: Diagnosis not present

## 2016-10-08 DIAGNOSIS — E119 Type 2 diabetes mellitus without complications: Secondary | ICD-10-CM | POA: Diagnosis not present

## 2016-10-08 NOTE — Progress Notes (Signed)
Connie Hall   Referring MD: Elby Showers, Md [address] [place], [place] [address]   Connie Hall 51 y.o.  Jan 31, 1966    Reason for Referral: Family history of pancreas cancer   HPI: Connie Hall is referred for oncology evaluation in the setting of a family history of pancreas cancer. She feels well and present. She was diagnosed with influenza and bronchitis last week by Dr. Renold Genta. She completed treatment with azithromycin and Tamiflu. Her symptoms have improved. She reports a paternal aunt died of pancreas cancer dates 31. Her father died at a 19 of pancreas cancer. Her father had 4 siblings. Her sister, age 58, was incidentally found to have a mass at the tail the pancreas when undergoing evaluation to be a kidney donor. She underwent surgery in [place] and was confirmed to have pancreas cancer-"neuroendocrine "type. A paternal aunt had lung cancer and was a smoker. Paternal first cousin had testicular cancer. No other family history of cancer.  Past Medical History:  Diagnosis Date  . Deviated nasal septum   . DM (diabetes mellitus) ([place])   . History of discoid lupus erythematosus   . Hyperlipidemia   . Restless legs syndrome (RLS)   . Vitamin D deficiency     .  G0 P0  Past Surgical History:  Procedure Laterality Date  . ROTATOR CUFF REPAIR     left  . Sebaceous cyst removal, back  03/2008    .  Left retina surgery                                                                                  March 2017  Medications: Reviewed  Allergies:  Allergies  Allergen Reactions  . Sulfonamide Derivatives Hives    Family history: As per history of present illness  Social History:   She lives alone in [place]. She works in an office occupation. She does not use tobacco or alcohol. No transfusion history. No risk factor for HIV or hepatitis.      ROS:   Positives include: Chills, myalgias, and cough 1 week  ago-improved after Tamiflu and azithromycin, hot flashes  A complete ROS was otherwise negative.  Physical Exam:  Blood pressure (!) 139/59, pulse 92, temperature 98.9 F (37.2 C), temperature source Oral, resp. rate 18, height 4\' 11"  (1.499 m), weight 239 lb (108.4 kg), SpO2 96 %.  HEENT: Oropharynx without visible mass, neck without mass Lungs: Clear bilaterally Cardiac: Regular rate and rhythm Abdomen: No hepatosplenomegaly, no mass, nontender  Vascular: No leg edema Lymph nodes: No cervical, supraclavicular, axillary, or inguinal nodes Neurologic: Alert and oriented, the motor exam appears intact in the upper and lower extremities Skin: No rash Musculoskeletal: No spine tenderness   LAB:  CBC  Lab Results  Component Value Date   WBC 8.7 02/02/2016   HGB 12.6 02/02/2016   HCT 39.2 02/02/2016   MCV 87.3 02/02/2016   PLT 332 02/02/2016   NEUTROABS 6,264 02/02/2016     CMP      Component Value Date/Time   NA 139 02/02/2016 1129   K 4.7 02/02/2016 1129   CL 106 02/02/2016 1129   CO2  22 02/02/2016 1129   GLUCOSE 106 (H) 02/02/2016 1129   BUN 11 02/02/2016 1129   CREATININE 0.82 02/02/2016 1129   CALCIUM 8.7 02/02/2016 1129   PROT 7.0 07/26/2016 1138   ALBUMIN 4.1 07/26/2016 1138   AST 10 07/26/2016 1138   ALT 12 07/26/2016 1138   ALKPHOS 41 07/26/2016 1138   BILITOT 0.4 07/26/2016 1138   GFRNONAA 85 02/02/2016 1129   GFRAA >89 02/02/2016 1129      Assessment/Plan:   1. Family history of pancreas cancer  Father and paternal uncle died of pancreas cancer  Sister, age 47, diagnosed with a "neuroendocrine "tumor of the tail the pancreas in 2017  2. Diabetes  3.   Hyperlipidemia  4.   Restless leg syndrome   Disposition:   Connie Hall is referred for oncology evaluation due to the family history pancreas cancer. She appears well. The family history is suggestive of the possibility of a hereditary pancreas cancer syndrome. The report of a pancreatic  neuroendocrine tumor in her sister may be unrelated to the cancers diagnosed in her father and uncle. She does not have a history to suggest HNPCC or other established hereditary cancer syndromes.  We will confirm the diabetes diagnosis is long-standing as a new diagnosis of diabetes can be indicative of underlying pancreas cancer.  We referred her to the genetics counselor to consider testing for a pancreas cancer syndrome and to recommend screening.  She will return for an office visit in 2 months.  50 minutes were spent with the patient today. The majority of the time was used for counseling and coordination of care.  Betsy Coder, MD  10/08/2016, 2:27 PM

## 2016-10-08 NOTE — Telephone Encounter (Signed)
Appointments scheduled per 10/08/16 los. Patient was given a copy of the AVS report and appointment schedule per 10/08/16 los. °

## 2016-10-09 ENCOUNTER — Telehealth: Payer: Self-pay

## 2016-10-09 ENCOUNTER — Telehealth: Payer: Self-pay | Admitting: Oncology

## 2016-10-09 NOTE — Telephone Encounter (Signed)
Called pt to see if she is a diabetic per MD request, pt states she is not. Pt also requests to cancel genetics appt for 10/11/16 and will call and reschedule when she can look at her work schedule.

## 2016-10-09 NOTE — Telephone Encounter (Signed)
Needs to re schedule her appointment on 2/15  any day  First thing in the morning or last thing in the afternoon  [phone]

## 2016-10-10 ENCOUNTER — Telehealth: Payer: Self-pay | Admitting: Oncology

## 2016-10-10 NOTE — Telephone Encounter (Signed)
Pt called to r/s genetics appt to 3/14 at 2 pm.

## 2016-10-11 ENCOUNTER — Other Ambulatory Visit: Payer: 59

## 2016-11-07 ENCOUNTER — Encounter: Payer: Self-pay | Admitting: Genetic Counselor

## 2016-11-07 ENCOUNTER — Other Ambulatory Visit: Payer: 59

## 2016-11-07 ENCOUNTER — Ambulatory Visit (HOSPITAL_BASED_OUTPATIENT_CLINIC_OR_DEPARTMENT_OTHER): Payer: 59 | Admitting: Genetic Counselor

## 2016-11-07 DIAGNOSIS — Z315 Encounter for genetic counseling: Secondary | ICD-10-CM | POA: Diagnosis not present

## 2016-11-07 DIAGNOSIS — Z8042 Family history of malignant neoplasm of prostate: Secondary | ICD-10-CM

## 2016-11-07 DIAGNOSIS — Z800 Family history of malignant neoplasm of digestive organs: Secondary | ICD-10-CM | POA: Diagnosis not present

## 2016-11-07 NOTE — Progress Notes (Addendum)
REFERRING PROVIDER: Elby Showers, MD [address] [place], [place] [address]   Betsy Coder, MD  PRIMARY PROVIDER:  Elby Showers, MD  PRIMARY REASON FOR VISIT:  1. Family history of pancreatic cancer   2. Family history of prostate cancer      HISTORY OF PRESENT ILLNESS:   Connie Hall, a 51 y.o. female, was seen for a [place] cancer genetics consultation at the request of Dr. Benay Spice due to a family history of cancer.  Connie Hall presents to clinic today to discuss the possibility of a hereditary predisposition to cancer, genetic testing, and to further clarify her future cancer risks, as well as potential cancer risks for family members. Connie Hall is a 51 y.o. female with no personal history of cancer.  She was seen due to her concern for her family history of pancreatic cancer and whether it was due to a hereditary cancer syndrome.  CANCER HISTORY:   No history exists.     HORMONAL RISK FACTORS:  Menarche was at age 57.  First live birth at age N/A.  OCP use for approximately 15 years.  Ovaries intact: yes.  Hysterectomy: no.  Menopausal status: premenopausal.  HRT use: 0 years. Colonoscopy: no; not examined. Mammogram within the last year: no. Number of breast biopsies: 0. Up to date with pelvic exams:  yes. Any excessive radiation exposure in the past:  no  Past Medical History:  Diagnosis Date  . Deviated nasal septum   . DM (diabetes mellitus) ([place])   . Family history of pancreatic cancer   . Family history of prostate cancer   . History of discoid lupus erythematosus   . Hyperlipidemia   . Restless legs syndrome (RLS)   . Vitamin D deficiency     Past Surgical History:  Procedure Laterality Date  . ROTATOR CUFF REPAIR     left  . Sebaceous cyst removal, back  03/2008    Social History   Social History  . Marital status: Single    Spouse name: N/A  . Number of children: 0  . Years of education: N/A   Occupational History  .  [employer]   Social History Main Topics  . Smoking status: Former Smoker    Packs/day: 1.00    Years: 15.00    Types: Cigarettes    Quit date: 08/27/1998  . Smokeless tobacco: Never Used  . Alcohol use Yes     Comment: rare  . Drug use: No  . Sexual activity: Not Asked   Other Topics Concern  . None   Social History Narrative  . None     FAMILY HISTORY:  We obtained a detailed, 4-generation family history.  Significant diagnoses are listed below: Family History  Problem Relation Age of Onset  . Hypertension Father   . Stroke Father   . Heart disease Father   . Diabetes Father   . Pancreatic cancer Father 35  . Prostate cancer Father     dx in his 77s; prostatectomy  . Skin cancer Mother   . Pancreatic cancer Sister 33    neuroendocrine tumor  . Colon polyps Sister   . Congestive Heart Failure Maternal Aunt   . Dementia Maternal Uncle   . Lung cancer Paternal Aunt   . Heart attack Paternal Uncle   . Colon polyps Sister   . Colon polyps Sister   . Pancreatic cancer Paternal Uncle 24  . Testicular cancer Cousin  paternal first cousin dx in his 55s    The patient does not have children.  She has four sisters, her oldest sister has a diagnosis of a neuroendocrine pancreatic cancer.  She also has a history of colon polyps.  Two other sisters also have a history of colon polyps.  The patient's father was diagnosed with prostate cancer in his 82's and pancreatic cancer at 24.  He has a twin brother who died of heart disease.  He had another brother who had pancreatic cancer at 63.  This brother had a son with testicular cancer.  The patient's father also had a sister who had lung cancer.   The patient's mother is alive at 25.  She has had skin cancer in the past.  She has three brothers and a sister, all who are deceased from non cancer related issues.  There is no other reported family history of cancer.   Connie Hall is unaware of previous  family history of genetic testing for hereditary cancer risks. Patient's maternal ancestors are of [place] and [place] descent, and paternal ancestors are of [place] descent. There is no reported Ashkenazi Jewish ancestry. There is no known consanguinity.  GENETIC COUNSELING ASSESSMENT: Connie Hall is a 51 y.o. female with a family history of pancreatic and prostate cancer which is somewhat suggestive of a hereditary cancer syndrome and predisposition to cancer. We, therefore, discussed and recommended the following at today's visit.   DISCUSSION: We discussed that about 5-10% of pancreatic cancer is due to hereditary causes, most commonly BRCA mutations or Lynch syndrome.  There are several types of pancreatic cancer, the most common is due to adenocarcinoma which typically results in a late stage diagnosis.  Neuroendocrine pancreatic cancer are a different type of cancer, and can have better outcomes than adenocarcinomas. Adenocarcinoma's are more commonly associated with BRCA and Lynch syndromes mutations, whereas neuroendocrine pancreatic cancer can sometimes be associated with MEN.  We reviewed the characteristics, features and inheritance patterns of hereditary cancer syndromes. We also discussed genetic testing, including the appropriate family members to test, the process of testing, insurance coverage and turn-around-time for results. We discussed the implications of a negative, positive and/or variant of uncertain significant result. We recommended Connie Hall pursue genetic testing for the Common Hereditary cancer gene panel. The Hereditary Gene Panel offered by [facility] includes sequencing and/or deletion duplication testing of the following 43 genes: APC, ATM, AXIN2, BARD1, BMPR1A, BRCA1, BRCA2, BRIP1, CDH1, CDKN2A (p14ARF), CDKN2A (p16INK4a), CHEK2, DICER1, EPCAM (Deletion/duplication testing only), GREM1 (promoter region deletion/duplication testing only), KIT, MEN1, MLH1, MSH2, MSH6, MUTYH, NBN,  NF1, PALB2, PDGFRA, PMS2, POLD1, POLE, PTEN, RAD50, RAD51C, RAD51D, SDHB, SDHC, SDHD, SMAD4, SMARCA4. STK11, TP53, TSC1, TSC2, and VHL.  The following gene was evaluated for sequence changes only: SDHA and HOXB13 c.251G>A variant only.  Based on Ms. Lasseter's family history of cancer, she meets medical criteria for genetic testing. Despite that she meets criteria, she may still have an out of pocket cost. We discussed that if her out of pocket cost for testing is over $100, the laboratory will call and confirm whether she wants to proceed with testing.  If the out of pocket cost of testing is less than $100 she will be billed by the genetic testing laboratory.   PLAN: After considering the risks, benefits, and limitations, Connie Hall  provided informed consent to pursue genetic testing and the blood sample was sent to [facility] for analysis of the Common Hereditary cancer panel. Results should be  available within approximately 2-3 weeks' time, at which point they will be disclosed by telephone to Connie Hall, as will any additional recommendations warranted by these results. Connie Hall will receive a summary of her genetic counseling visit and a copy of her results once available. This information will also be available in Epic. We encouraged Connie Hall to remain in contact with cancer genetics annually so that we can continuously update the family history and inform her of any changes in cancer genetics and testing that may be of benefit for her family. Connie Hall's questions were answered to her satisfaction today. Our contact information was provided should additional questions or concerns arise.  We recommend that Connie Hall's sister, who was diagnosed with a neuroendocrine pancreatic cancer, undergo genetic testing.  We can help with identifying a place for her sister to be referred and obtain testing if needed.  We discussed with Connie Hall, that if her sister is positive for a  hereditary mutation, we can be reassured that Connie Hall's risk for pancreatic cancer is low.  However, if her sister is negative for a hereditary mutation, then we could consider referring Connie Hall to [facility] for their pancreatic cancer study.  Connie Hall was in favor of this.  Lastly, we encouraged Connie Hall to remain in contact with cancer genetics annually so that we can continuously update the family history and inform her of any changes in cancer genetics and testing that may be of benefit for this family.   Ms.  Hall's questions were answered to her satisfaction today. Our contact information was provided should additional questions or concerns arise. Thank you for the referral and allowing [place] to share in the care of your patient.   Myesha Stillion P. Florene Glen, [place], [facility] Certified Genetic Counselor [email or web address] phone: [phone]  The patient was seen for a total of 60 minutes in face-to-face genetic counseling.  This patient was discussed with Drs. Magrinat, Lindi Adie and/or Burr Medico who agrees with the above.    _______________________________________________________________________ For Office Staff:  Number of people involved in session: 1 Was an Intern/ student involved with case: no

## 2016-11-19 ENCOUNTER — Telehealth: Payer: Self-pay | Admitting: Oncology

## 2016-11-19 NOTE — Telephone Encounter (Signed)
Pt called to r/s April appt to 4/26 at 0800.

## 2016-11-21 ENCOUNTER — Encounter: Payer: Self-pay | Admitting: Genetic Counselor

## 2016-11-21 ENCOUNTER — Telehealth: Payer: Self-pay | Admitting: Genetic Counselor

## 2016-11-21 DIAGNOSIS — Z1379 Encounter for other screening for genetic and chromosomal anomalies: Secondary | ICD-10-CM | POA: Insufficient documentation

## 2016-11-21 NOTE — Telephone Encounter (Signed)
Revealed negative genetic testing.  Discussed that we do not know why there is cancer in the family. It could be due to a different gene that we are not testing, or maybe our current technology may not be able to pick something up.  It will be important for her to keep in contact with genetics to keep up with whether additional testing may be needed.  She will contact her sister to see if she would be willing to undergo genetic testing.

## 2016-12-06 ENCOUNTER — Ambulatory Visit: Payer: 59 | Admitting: Oncology

## 2016-12-20 ENCOUNTER — Ambulatory Visit: Payer: 59 | Admitting: Oncology

## 2017-01-01 MED FILL — rOPINIRole HCL 0.5 MG TABS: 0.5 | 90 days supply | Qty: 270 | Fill #2

## 2017-01-01 MED FILL — ATORVASTATIN 10 MG TABLET: 10 | 90 days supply | Qty: 90 | Fill #1

## 2017-01-29 ENCOUNTER — Ambulatory Visit (INDEPENDENT_AMBULATORY_CARE_PROVIDER_SITE_OTHER): Payer: 59 | Admitting: Podiatry

## 2017-01-29 ENCOUNTER — Encounter: Payer: Self-pay | Admitting: Podiatry

## 2017-01-29 DIAGNOSIS — L600 Ingrowing nail: Secondary | ICD-10-CM

## 2017-01-29 NOTE — Patient Instructions (Signed)

## 2017-01-30 NOTE — Progress Notes (Signed)
She presents today chief complaint of painful ingrown toenail to the hallux left.  Objective: Vital signs are stable she is alert and oriented 3. Pulses are palpable. Sharp incurvated nail margins to the tibiofibular border hallux left.  Assessment: Pain limb secondary to ingrown nail tibial border hallux left.  Plan: Chemical matrixectomy was performed today after local anesthesia was administered both borders were removed. She tolerated the procedure well. Cortisporin Otic was ordered to be applied twice daily after soaking she was provided with both oral and written home going instructions for care of soaking of her toes. Follow-up with her in 2 weeks we will consider the right foot at that time if it is not improved.

## 2017-01-31 ENCOUNTER — Ambulatory Visit (HOSPITAL_BASED_OUTPATIENT_CLINIC_OR_DEPARTMENT_OTHER): Payer: 59 | Admitting: Oncology

## 2017-01-31 VITALS — BP 159/65 | HR 96 | Temp 98.3°F | Resp 22 | Ht 59.0 in | Wt 240.6 lb

## 2017-01-31 DIAGNOSIS — Z800 Family history of malignant neoplasm of digestive organs: Secondary | ICD-10-CM

## 2017-01-31 DIAGNOSIS — E119 Type 2 diabetes mellitus without complications: Secondary | ICD-10-CM | POA: Diagnosis not present

## 2017-01-31 NOTE — Progress Notes (Signed)
  [place] OFFICE PROGRESS NOTE   Diagnosis: Family history pancreas cancer  INTERVAL HISTORY:   Connie Hall returns as scheduled. She was referred to the genetics counselor and underwent a 43 gene genetic panel. This returned negative. She feels well. She reports mild discomfort at the upper mid back. Good appetite. No abdominal pain.   Objective:  Vital signs in last 24 hours:  Blood pressure (!) 159/65, pulse 96, temperature 98.3 F (36.8 C), temperature source Oral, resp. rate (!) 22, height 4\' 11"  (1.499 m), weight 240 lb 9.6 oz (109.1 kg), SpO2 97 %.    HEENT: Neck without mass, soft mobile structure moves over the left,: Without a discrete mass Lymphatics: No cervical or supra-clavicular nodes Resp: Lungs clear bilaterally Cardio: Regular rate and rhythm GI: No hepatosplenomegaly, no mass, nontender Vascular: No leg edema  Skin: 1-2 millimeter hyperpigmented mole at the right upper back near the shoulder.     Medications: I have reviewed the patient's current medications.  Assessment/Plan: 1. Family history of pancreas cancer ? Father and paternal uncle died of pancreas cancer ? Sister, age 49, diagnosed with a "neuroendocrine "tumor of the tail the pancreas in 2017 ? Negative 43 gene genetic panel 11/07/2016  2. Diabetes  3.   Hyperlipidemia  4.   Restless leg syndrome    Disposition:  Connie Hall has a family history pancreas cancer. Genetic testing did not reveal a definable familial cancer syndrome. She understands there is no clear "standard" screening recommendation for pancreas cancer. I offered a referral to the genetics oncology program at [facility]. She feels comfortable with observation and routine medical care.  She will follow-up with Dr. Renold Genta for internal medicine care. She is not scheduled for a follow-up appointment at the [facility]. I am available to see her in the future as needed.  The tiny hyperpigmented mole at the  right upper back is likely benign. She will ask Dr. Ubaldo Glassing to evaluate this.  15 minutes were spent with the patient today. The majority of the time was used for counseling and coordination of care.  Donneta Romberg, MD  01/31/2017  4:25 PM

## 2017-02-05 DIAGNOSIS — G4733 Obstructive sleep apnea (adult) (pediatric): Secondary | ICD-10-CM | POA: Diagnosis not present

## 2017-02-07 ENCOUNTER — Other Ambulatory Visit: Payer: 59 | Admitting: Internal Medicine

## 2017-02-07 DIAGNOSIS — E559 Vitamin D deficiency, unspecified: Secondary | ICD-10-CM | POA: Diagnosis not present

## 2017-02-07 DIAGNOSIS — E785 Hyperlipidemia, unspecified: Secondary | ICD-10-CM | POA: Diagnosis not present

## 2017-02-07 DIAGNOSIS — Z130 Encounter for screening for diseases of the blood and blood-forming organs and certain disorders involving the immune mechanism: Secondary | ICD-10-CM | POA: Diagnosis not present

## 2017-02-07 DIAGNOSIS — Z0000 Encounter for general adult medical examination without abnormal findings: Secondary | ICD-10-CM | POA: Diagnosis not present

## 2017-02-07 DIAGNOSIS — Z1329 Encounter for screening for other suspected endocrine disorder: Secondary | ICD-10-CM | POA: Diagnosis not present

## 2017-02-07 DIAGNOSIS — E119 Type 2 diabetes mellitus without complications: Secondary | ICD-10-CM | POA: Diagnosis not present

## 2017-02-07 DIAGNOSIS — E6609 Other obesity due to excess calories: Secondary | ICD-10-CM | POA: Diagnosis not present

## 2017-02-07 LAB — CBC WITH DIFFERENTIAL/PLATELET
BASOS ABS: 0 {cells}/uL (ref 0–200)
Basophils Relative: 0 %
Eosinophils Absolute: 192 cells/uL (ref 15–500)
Eosinophils Relative: 2 %
HEMATOCRIT: 40.1 % (ref 35.0–45.0)
HEMOGLOBIN: 12.7 g/dL (ref 11.7–15.5)
LYMPHS ABS: 1920 {cells}/uL (ref 850–3900)
Lymphocytes Relative: 20 %
MCH: 27.9 pg (ref 27.0–33.0)
MCHC: 31.7 g/dL — AB (ref 32.0–36.0)
MCV: 87.9 fL (ref 80.0–100.0)
MONO ABS: 480 {cells}/uL (ref 200–950)
MPV: 9.2 fL (ref 7.5–12.5)
Monocytes Relative: 5 %
NEUTROS PCT: 73 %
Neutro Abs: 7008 cells/uL (ref 1500–7800)
Platelets: 329 10*3/uL (ref 140–400)
RBC: 4.56 MIL/uL (ref 3.80–5.10)
RDW: 14.3 % (ref 11.0–15.0)
WBC: 9.6 10*3/uL (ref 3.8–10.8)

## 2017-02-07 LAB — COMPLETE METABOLIC PANEL WITH GFR
ALBUMIN: 3.9 g/dL (ref 3.6–5.1)
ALK PHOS: 51 U/L (ref 33–115)
ALT: 13 U/L (ref 6–29)
AST: 11 U/L (ref 10–35)
BUN: 14 mg/dL (ref 7–25)
CALCIUM: 8.9 mg/dL (ref 8.6–10.2)
CO2: 23 mmol/L (ref 20–31)
Chloride: 102 mmol/L (ref 98–110)
Creat: 0.85 mg/dL (ref 0.50–1.10)
GFR, EST NON AFRICAN AMERICAN: 81 mL/min (ref >=60)
GFR, Est African American: >89 mL/min (ref >=60)
Glucose, Bld: 109 mg/dL — ABNORMAL HIGH (ref 65–99)
POTASSIUM: 4.5 mmol/L (ref 3.5–5.3)
Sodium: 135 mmol/L (ref 135–146)
Total Bilirubin: 0.4 mg/dL (ref 0.2–1.2)
Total Protein: 6.7 g/dL (ref 6.1–8.1)

## 2017-02-07 LAB — LIPID PANEL
CHOLESTEROL: 144 mg/dL (ref <200)
HDL: 33 mg/dL — ABNORMAL LOW (ref >50)
LDL CALC: 77 mg/dL (ref <100)
TRIGLYCERIDES: 168 mg/dL — AB (ref <150)
Total CHOL/HDL Ratio: 4.4 Ratio (ref <5.0)
VLDL: 34 mg/dL — ABNORMAL HIGH (ref <30)

## 2017-02-07 LAB — TSH: TSH: 2.42 mIU/L

## 2017-02-08 LAB — MICROALBUMIN / CREATININE URINE RATIO
Creatinine, Urine: 122 mg/dL (ref 20–320)
MICROALB/CREAT RATIO: 12 ug/mg{creat} (ref <30)
Microalb, Ur: 1.5 mg/dL

## 2017-02-08 LAB — HEMOGLOBIN A1C
HEMOGLOBIN A1C: 6.1 % — AB (ref <5.7)
Mean Plasma Glucose: 128 mg/dL

## 2017-02-08 LAB — VITAMIN D 25 HYDROXY (VIT D DEFICIENCY, FRACTURES): Vit D, 25-Hydroxy: 36 ng/mL (ref 30–100)

## 2017-02-12 ENCOUNTER — Encounter: Payer: Self-pay | Admitting: Internal Medicine

## 2017-02-12 ENCOUNTER — Ambulatory Visit (INDEPENDENT_AMBULATORY_CARE_PROVIDER_SITE_OTHER): Payer: 59 | Admitting: Internal Medicine

## 2017-02-12 VITALS — BP 126/90 | HR 96 | Temp 98.2°F | Ht 59.0 in | Wt 239.0 lb

## 2017-02-12 DIAGNOSIS — Z0000 Encounter for general adult medical examination without abnormal findings: Secondary | ICD-10-CM | POA: Diagnosis not present

## 2017-02-12 DIAGNOSIS — Z6841 Body Mass Index (BMI) 40.0 and over, adult: Secondary | ICD-10-CM

## 2017-02-12 DIAGNOSIS — G4733 Obstructive sleep apnea (adult) (pediatric): Secondary | ICD-10-CM

## 2017-02-12 DIAGNOSIS — E781 Pure hyperglyceridemia: Secondary | ICD-10-CM | POA: Diagnosis not present

## 2017-02-12 DIAGNOSIS — E786 Lipoprotein deficiency: Secondary | ICD-10-CM | POA: Diagnosis not present

## 2017-02-12 DIAGNOSIS — G2581 Restless legs syndrome: Secondary | ICD-10-CM

## 2017-02-12 DIAGNOSIS — R7302 Impaired glucose tolerance (oral): Secondary | ICD-10-CM

## 2017-02-12 LAB — POCT URINALYSIS DIPSTICK
Bilirubin, UA: NEGATIVE
GLUCOSE UA: NEGATIVE
Ketones, UA: NEGATIVE
Leukocytes, UA: NEGATIVE
Nitrite, UA: NEGATIVE
Protein, UA: NEGATIVE
RBC UA: NEGATIVE
SPEC GRAV UA: 1.01 (ref 1.010–1.025)
UROBILINOGEN UA: 0.2 U/dL
pH, UA: 5 (ref 5.0–8.0)

## 2017-02-14 ENCOUNTER — Encounter: Payer: Self-pay | Admitting: Podiatry

## 2017-02-14 ENCOUNTER — Ambulatory Visit (INDEPENDENT_AMBULATORY_CARE_PROVIDER_SITE_OTHER): Payer: Self-pay | Admitting: Podiatry

## 2017-02-14 DIAGNOSIS — L600 Ingrowing nail: Secondary | ICD-10-CM

## 2017-02-14 NOTE — Progress Notes (Signed)
She presents today for follow-up of matrixectomy hallux left. She states that she's only been soaking in Epsom salts and warm water and apply Cortisporin otic as directed.  Objective: Vital signs are stable she is alert and oriented 3. Pulses are palpable. Mild erythema about the surgical site lateral border as opposed to the tibial border. There is some serosanguineous drainage I see no signs of infection.  Assessment: Resolving matrixectomy hallux left.  Plan: Discontinue Betadine Epsom salts and water soaks continue to cover during the daytime but leave open at bedtime.

## 2017-02-14 NOTE — Patient Instructions (Signed)

## 2017-02-23 NOTE — Patient Instructions (Signed)
It was a pleasure sees a day. Please continue to work on diet exercise and weight loss. Triglycerides need improvement. Consider triglyceride medication if not improving in the next 6 months. Consider metformin for impaired glucose tolerance. Have annual mammogram and flu vaccine.

## 2017-02-23 NOTE — Progress Notes (Signed)
Subjective:    Patient ID: Connie Hall, female    DOB: [birth year], 51 y.o.   MRN: [id number]  HPI Pleasant 51 year old Female in today for health maintenance exam and evaluation of medical issues.  In 2017 was seen by cornea specialist from [facility] in [place] regarding retinal holes.  History of plantar fasciitis, hyperlipidemia, obesity, restless leg syndrome, controlled type 2 diabetes without complication, obstructive sleep apnea, dependent edema, deviated nasal septum, history of vitamin D deficiency and metabolic syndrome.  She is allergic to sulfa-he causes hives  Some pain medications cause nausea  Tetanus immunization given 2012 in the emergency department due to laceration right great toe when she stepped on a piece of 10.  Gets influenza immunization through employment.  History of deviated nasal septum seen by ENT physician but has not had it repaired.  Weight is been an issue for number of years. Has tried various programs and most recently Weight Wat[employer].  Past medical history: Left shoulder arthropathy 2009, right medial epicondylitis August 2009, right carpal tunnel syndrome September 2009. Patient had sebaceous cyst removed from her back by Dr. Zella Richer August 2009. History of obstructive sleep apnea and has seen Dr. Gwenette Greet. Has sleep apnea machine.  Social history: Single, never married, does not smoke. Seldom consumes alcohol. Has a college degree. Lives alone. Family lives in [place]. 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  Family history: Father with history of hypertension, stroke, coronary artery disease, kidney stones and diabetes. Mother with history of osteoporosis. No brothers. 4 sisters, 2 of whom have diabetes. 2 sisters have sleep apnea and 3 sisters are overweight, one sister in good health.    Review of Systems  Constitutional: Negative.   Respiratory: Negative.   Cardiovascular:  Negative.   Gastrointestinal: Negative.   Genitourinary: Negative.   Neurological: Negative.   Psychiatric/Behavioral: Negative.        Objective:   Physical Exam  Constitutional: She is oriented to person, place, and time. She appears well-developed and well-nourished. No distress.  HENT:  Head: Normocephalic and atraumatic.  Right Ear: External ear normal.  Left Ear: External ear normal.  Mouth/Throat: Oropharynx is clear and moist.  Eyes: Conjunctivae and EOM are normal. Pupils are equal, round, and reactive to light. Right eye exhibits no discharge. Left eye exhibits no discharge.  Neck: Neck supple. No JVD present.  Cardiovascular: Normal rate, normal heart sounds and intact distal pulses.   No murmur heard. Pulmonary/Chest: Effort normal and breath sounds normal. She has no wheezes. She has no rales.  Abdominal: Soft. Bowel sounds are normal. She exhibits no distension and no mass. There is no tenderness. There is no rebound and no guarding.  Genitourinary:  Genitourinary Comments: Pap taken in 2017. Bimanual normal today.  Musculoskeletal: She exhibits no edema.  Lymphadenopathy:    She has no cervical adenopathy.  Neurological: She is alert and oriented to person, place, and time. She has normal reflexes. No cranial nerve deficit. Coordination normal.  Skin: Skin is warm and dry. No rash noted. She is not diaphoretic.  Psychiatric: She has a normal mood and affect. Her behavior is normal. Judgment and thought content normal.  Vitals reviewed.         Assessment & Plan:  Normal health maintenance exam  BMI-48.27  Restless leg syndrome-- refill Requip  Dependent edema  History of Vitamin D deficiency-level female normal  History of discoid lupus  Hyperlipidemia-triglycerides are 168 and previously were 198  7 months ago. Total cholesterol and LDL cholesterol normal. HDL cholesterol is 33  History of type 2 diabetes mellitus-stable for past 6 months at  6.1%  Plan: Continue to work on diet exercise and weight loss efforts and return in 6 months.

## 2017-04-01 MED FILL — ATORVASTATIN 10 MG TABLET: 10 | 90 days supply | Qty: 90 | Fill #2

## 2017-04-01 MED FILL — rOPINIRole HCL 0.5 MG TABS: 0.5 | 90 days supply | Qty: 270 | Fill #3

## 2017-04-02 ENCOUNTER — Encounter (HOSPITAL_COMMUNITY): Payer: Self-pay

## 2017-05-30 ENCOUNTER — Encounter: Payer: Self-pay | Admitting: Internal Medicine

## 2017-06-10 ENCOUNTER — Telehealth: Payer: 59 | Admitting: Family

## 2017-06-10 DIAGNOSIS — J029 Acute pharyngitis, unspecified: Secondary | ICD-10-CM

## 2017-06-10 MED ORDER — BENZONATATE 100 MG PO CAPS: 100.0000 mg | ORAL_CAPSULE | Freq: Three times a day (TID) | ORAL | 0 refills | Status: DC | PRN

## 2017-06-10 MED ORDER — PREDNISONE 5 MG PO TABS: 5.0000 mg | ORAL_TABLET | ORAL | 0 refills | Status: DC

## 2017-06-10 MED FILL — predniSONE 5 MG TABS: 5 | 6 days supply | Qty: 21 | Fill #0

## 2017-06-10 MED FILL — BENZONATATE 100 MG CAPS: 100 | 5 days supply | Qty: 30 | Fill #0

## 2017-06-10 NOTE — Progress Notes (Signed)
Thank you for the details you included in the comment boxes. Those details are very helpful in determining the best course of treatment for you and help [place] to provide the best care.  We are sorry that you are not feeling well.  Here is how we plan to help!  Based on your presentation I believe you most likely have A cough due to a virus.  This is called viral bronchitis and is best treated by rest, plenty of fluids and control of the cough.  You may use Ibuprofen or Tylenol as directed to help your symptoms.     In addition you may use A non-prescription cough medication called Mucinex DM: take 2 tablets every 12 hours. and A prescription cough medication called Tessalon Perles 100mg . You may take 1-2 capsules every 8 hours as needed for your cough.  Sterapred 5 mg dosepak  From your responses in the eVisit questionnaire you describe inflammation in the upper respiratory tract which is causing a significant cough.  This is commonly called Bronchitis and has four common causes:    Allergies  Viral Infections  Acid Reflux  Bacterial Infection Allergies, viruses and acid reflux are treated by controlling symptoms or eliminating the cause. An example might be a cough caused by taking certain blood pressure medications. You stop the cough by changing the medication. Another example might be a cough caused by acid reflux. Controlling the reflux helps control the cough.  USE OF BRONCHODILATOR ("RESCUE") INHALERS: There is a risk from using your bronchodilator too frequently.  The risk is that over-reliance on a medication which only relaxes the muscles surrounding the breathing tubes can reduce the effectiveness of medications prescribed to reduce swelling and congestion of the tubes themselves.  Although you feel brief relief from the bronchodilator inhaler, your asthma may actually be worsening with the tubes becoming more swollen and filled with mucus.  This can delay other crucial treatments, such  as oral steroid medications. If you need to use a bronchodilator inhaler daily, several times per day, you should discuss this with your provider.  There are probably better treatments that could be used to keep your asthma under control.     HOME CARE . Only take medications as instructed by your medical team. . Complete the entire course of an antibiotic. . Drink plenty of fluids and get plenty of rest. . Avoid close contacts especially the very young and the elderly . Cover your mouth if you cough or cough into your sleeve. . Always remember to wash your hands . A steam or ultrasonic humidifier can help congestion.   GET HELP RIGHT AWAY IF: . You develop worsening fever. . You become short of breath . You cough up blood. . Your symptoms persist after you have completed your treatment plan MAKE SURE YOU   Understand these instructions.  Will watch your condition.  Will get help right away if you are not doing well or get worse.  Your e-visit answers were reviewed by a board certified advanced clinical practitioner to complete your personal care plan.  Depending on the condition, your plan could have included both over the counter or prescription medications. If there is a problem please reply  once you have received a response from your provider. Your safety is important to [place].  If you have drug allergies check your prescription carefully.    You can use MyChart to ask questions about today's visit, request a non-urgent call back, or ask for a work  or school excuse for 24 hours related to this e-Visit. If it has been greater than 24 hours you will need to follow up with your provider, or enter a new e-Visit to address those concerns. You will get an e-mail in the next two days asking about your experience.  I hope that your e-visit has been valuable and will speed your recovery. Thank you for using e-visits.

## 2017-06-18 ENCOUNTER — Ambulatory Visit: Payer: 59 | Admitting: Podiatry

## 2017-06-18 ENCOUNTER — Ambulatory Visit (INDEPENDENT_AMBULATORY_CARE_PROVIDER_SITE_OTHER): Payer: 59 | Admitting: Internal Medicine

## 2017-06-18 ENCOUNTER — Encounter: Payer: Self-pay | Admitting: Internal Medicine

## 2017-06-18 ENCOUNTER — Other Ambulatory Visit: Payer: Self-pay | Admitting: Internal Medicine

## 2017-06-18 VITALS — BP 130/90 | HR 109 | Temp 98.8°F | Ht 59.0 in | Wt 231.0 lb

## 2017-06-18 DIAGNOSIS — J069 Acute upper respiratory infection, unspecified: Secondary | ICD-10-CM

## 2017-06-18 DIAGNOSIS — Z1231 Encounter for screening mammogram for malignant neoplasm of breast: Secondary | ICD-10-CM

## 2017-06-18 DIAGNOSIS — H6692 Otitis media, unspecified, left ear: Secondary | ICD-10-CM | POA: Diagnosis not present

## 2017-06-18 DIAGNOSIS — G4730 Sleep apnea, unspecified: Secondary | ICD-10-CM

## 2017-06-18 MED ORDER — AZITHROMYCIN 250 MG PO TABS: ORAL_TABLET | ORAL | 0 refills | Status: DC

## 2017-06-18 MED FILL — AZITHROMYCIN 250 MG TABLET: 250 | 5 days supply | Qty: 6 | Fill #0

## 2017-06-18 NOTE — Patient Instructions (Addendum)
Needs new CPAP machine because current one is not repairable.  Prescription written at 13 cm.  Zithromax Z-PAK take 2 tablets day 1 followed by 1 tablet days 2 through 5.  Has Hycodan at home if needed for cough.

## 2017-06-18 NOTE — Progress Notes (Signed)
   Subjective:    Patient ID: Connie Hall, female    DOB: [birth year], 51 y.o.   MRN: [id number]  HPI Patient has severe obstructive sleep apnea and saw a pulmonologist in 2017.  Her setting is 13 cm.  She is compliant with the CPAP apparatus.  Her machine is not repairable.  She benefits from therapy.  Please issue new machine through [place].  Also she went to [place] about 2 weeks ago to a conference and came back with a respiratory infection.  She has had a productive cough which is aggravating..  No fever or shaking chills.  Has nasal congestion.  She was seen by E-visit on October 15  and was given prednisone and Tessalon Perles but is not better.     Review of Systems see above     Objective:   Physical Exam Chest clear to auscultation without rales or wheezing.  Cardiac exam regular rate and rhythm.  Extremities without edema.  Pharynx is injected without exudate.  Left TM is full and pink.  Right TM is clear.  Neck is supple without adenopathy.         Assessment & Plan:  Acute left otitis media  Acute upper respiratory infection  Severe obstructive sleep apnea  Plan: Prescription written for new CPAP machine at 13 cm.  Zithromax Z-PAK take 2 tablets day 1 followed by 1 tablet days 2 through 5.  She has Hycodan if needed for cough.

## 2017-07-02 ENCOUNTER — Encounter: Payer: Self-pay | Admitting: Podiatry

## 2017-07-02 ENCOUNTER — Ambulatory Visit (INDEPENDENT_AMBULATORY_CARE_PROVIDER_SITE_OTHER): Payer: 59 | Admitting: Podiatry

## 2017-07-02 DIAGNOSIS — L603 Nail dystrophy: Secondary | ICD-10-CM | POA: Diagnosis not present

## 2017-07-02 MED FILL — FLUTICASONE PROP 50 MCG SPR: 50 | 90 days supply | Qty: 48 | Fill #1

## 2017-07-02 MED FILL — rOPINIRole HCL 0.5 MG TABS: 0.5 | 90 days supply | Qty: 270 | Fill #4

## 2017-07-02 MED FILL — ATORVASTATIN 10 MG TABLET: 10 | 90 days supply | Qty: 90 | Fill #3

## 2017-07-03 NOTE — Progress Notes (Signed)
She presents today concerned about the matrixectomy was performed back in June she states the nail looks bad discolored looks like it's on a pillow.  Objective: Vital signs are stable she is alert and oriented 3. The remainder of the nail that is growing out is a portion of the nail with a likely fall off it does not appear to be infected at this point with fungus or bacteria it is simply loose from the nail bed and as the nail grows out that his margins will mall I expressed this to her she understands this and is amenable to it.  Assessment: Nail dystrophy hallux left.  Plan: Follow up with me as needed.

## 2017-07-08 DIAGNOSIS — G4733 Obstructive sleep apnea (adult) (pediatric): Secondary | ICD-10-CM | POA: Diagnosis not present

## 2017-07-30 ENCOUNTER — Ambulatory Visit
Admission: RE | Admit: 2017-07-30 | Discharge: 2017-07-30 | Disposition: A | Discharge status: Home / Self Care | Payer: 59 | Source: Ambulatory Visit | Attending: Internal Medicine | Admitting: Internal Medicine

## 2017-07-30 DIAGNOSIS — Z1231 Encounter for screening mammogram for malignant neoplasm of breast: Secondary | ICD-10-CM | POA: Diagnosis not present

## 2017-07-31 ENCOUNTER — Other Ambulatory Visit: Payer: Self-pay | Admitting: Internal Medicine

## 2017-07-31 DIAGNOSIS — E138 Other specified diabetes mellitus with unspecified complications: Secondary | ICD-10-CM

## 2017-07-31 DIAGNOSIS — E785 Hyperlipidemia, unspecified: Secondary | ICD-10-CM

## 2017-07-31 DIAGNOSIS — Z79899 Other long term (current) drug therapy: Secondary | ICD-10-CM

## 2017-08-07 DIAGNOSIS — G4733 Obstructive sleep apnea (adult) (pediatric): Secondary | ICD-10-CM | POA: Diagnosis not present

## 2017-08-08 ENCOUNTER — Other Ambulatory Visit (INDEPENDENT_AMBULATORY_CARE_PROVIDER_SITE_OTHER): Payer: 59 | Admitting: Internal Medicine

## 2017-08-08 DIAGNOSIS — E138 Other specified diabetes mellitus with unspecified complications: Secondary | ICD-10-CM | POA: Diagnosis not present

## 2017-08-08 DIAGNOSIS — E785 Hyperlipidemia, unspecified: Secondary | ICD-10-CM | POA: Diagnosis not present

## 2017-08-08 DIAGNOSIS — Z79899 Other long term (current) drug therapy: Secondary | ICD-10-CM

## 2017-08-08 NOTE — Addendum Note (Signed)
Addended by: Mady Haagensen on: 08/08/2017 09:23 AM   Modules accepted: Orders

## 2017-08-09 LAB — LIPID PANEL
CHOLESTEROL: 157 mg/dL (ref <200)
HDL: 33 mg/dL — ABNORMAL LOW (ref >50)
LDL Cholesterol (Calc): 97 mg/dL (calc)
NON-HDL CHOLESTEROL (CALC): 124 mg/dL (ref <130)
Total CHOL/HDL Ratio: 4.8 (calc) (ref <5.0)
Triglycerides: 178 mg/dL — ABNORMAL HIGH (ref <150)

## 2017-08-09 LAB — HEMOGLOBIN A1C
HEMOGLOBIN A1C: 6 %{Hb} — AB (ref <5.7)
MEAN PLASMA GLUCOSE: 126 (calc)
eAG (mmol/L): 7 (calc)

## 2017-08-09 LAB — HEPATIC FUNCTION PANEL
AG RATIO: 1.5 (calc) (ref 1.0–2.5)
ALKALINE PHOSPHATASE (APISO): 45 U/L (ref 33–115)
ALT: 11 U/L (ref 6–29)
AST: 11 U/L (ref 10–35)
Albumin: 4.1 g/dL (ref 3.6–5.1)
BILIRUBIN INDIRECT: 0.4 mg/dL (ref 0.2–1.2)
Bilirubin, Direct: 0.1 mg/dL (ref 0.0–0.2)
Globulin: 2.7 g/dL (calc) (ref 1.9–3.7)
TOTAL PROTEIN: 6.8 g/dL (ref 6.1–8.1)
Total Bilirubin: 0.5 mg/dL (ref 0.2–1.2)

## 2017-08-09 LAB — MICROALBUMIN / CREATININE URINE RATIO
Creatinine, Urine: 62 mg/dL (ref 20–275)
MICROALB/CREAT RATIO: 3 ug/mg{creat} (ref <30)
Microalb, Ur: 0.2 mg/dL

## 2017-08-13 ENCOUNTER — Encounter: Payer: Self-pay | Admitting: Internal Medicine

## 2017-08-13 ENCOUNTER — Ambulatory Visit (INDEPENDENT_AMBULATORY_CARE_PROVIDER_SITE_OTHER): Payer: 59 | Admitting: Internal Medicine

## 2017-08-13 VITALS — BP 128/80 | HR 93 | Ht 59.0 in | Wt 234.0 lb

## 2017-08-13 DIAGNOSIS — R7302 Impaired glucose tolerance (oral): Secondary | ICD-10-CM

## 2017-08-13 DIAGNOSIS — E781 Pure hyperglyceridemia: Secondary | ICD-10-CM | POA: Diagnosis not present

## 2017-08-13 DIAGNOSIS — Z6841 Body Mass Index (BMI) 40.0 and over, adult: Secondary | ICD-10-CM | POA: Diagnosis not present

## 2017-08-13 DIAGNOSIS — G2581 Restless legs syndrome: Secondary | ICD-10-CM

## 2017-08-13 DIAGNOSIS — R609 Edema, unspecified: Secondary | ICD-10-CM

## 2017-08-13 DIAGNOSIS — G4733 Obstructive sleep apnea (adult) (pediatric): Secondary | ICD-10-CM | POA: Diagnosis not present

## 2017-08-30 ENCOUNTER — Telehealth: Payer: 59 | Admitting: Family

## 2017-08-30 DIAGNOSIS — J329 Chronic sinusitis, unspecified: Secondary | ICD-10-CM

## 2017-08-30 DIAGNOSIS — B9689 Other specified bacterial agents as the cause of diseases classified elsewhere: Secondary | ICD-10-CM | POA: Diagnosis not present

## 2017-08-30 MED ORDER — AMOXICILLIN-POT CLAVULANATE 875-125 MG PO TABS: 1.0000 | ORAL_TABLET | Freq: Two times a day (BID) | ORAL | 0 refills | Status: AC

## 2017-08-30 MED ORDER — PREDNISONE 5 MG PO TABS: 5.0000 mg | ORAL_TABLET | ORAL | 0 refills | Status: DC

## 2017-08-30 MED ORDER — BENZONATATE 100 MG PO CAPS: 100.0000 mg | ORAL_CAPSULE | Freq: Three times a day (TID) | ORAL | 0 refills | Status: DC | PRN

## 2017-08-30 MED FILL — predniSONE 5 MG TABS: 5 | 6 days supply | Qty: 21 | Fill #0

## 2017-08-30 MED FILL — BENZONATATE 100 MG CAPS: 100 | 5 days supply | Qty: 30 | Fill #0

## 2017-08-30 MED FILL — AMOX-CLAV 875-125 MG TABLET: 875-125 | 7 days supply | Qty: 14 | Fill #0

## 2017-08-30 NOTE — Progress Notes (Signed)
Thank you for the details you included in the comment boxes. Those details are very helpful in determining the best course of treatment for you and help [place] to provide the best care. The antibiotics I have selected will also treat ear infection.  We are sorry that you are not feeling well.  Here is how we plan to help!  Based on what you have shared with me it looks like you have sinusitis.  Sinusitis is inflammation and infection in the sinus cavities of the head.  Based on your presentation I believe you most likely have Acute Bacterial Sinusitis.  This is an infection caused by bacteria and is treated with antibiotics. I have prescribed Augmentin 875mg /125mg  one tablet twice daily with food, for 7 days. You may use an oral decongestant such as Mucinex D or if you have glaucoma or high blood pressure use plain Mucinex. Saline nasal spray help and can safely be used as often as needed for congestion.  If you develop worsening sinus pain, fever or notice severe headache and vision changes, or if symptoms are not better after completion of antibiotic, please schedule an appointment with a health care provider.    For cough, I have sent tessalon perles 100mg , take 1 or 2 every 8 hours as needed. I have also sent a steroid dose pack to allow the fluid to drain from your ears.   Sinus infections are not as easily transmitted as other respiratory infection, however we still recommend that you avoid close contact with loved ones, especially the very young and elderly.  Remember to wash your hands thoroughly throughout the day as this is the number one way to prevent the spread of infection!  Home Care:  Only take medications as instructed by your medical team.  Complete the entire course of an antibiotic.  Do not take these medications with alcohol.  A steam or ultrasonic humidifier can help congestion.  You can place a towel over your head and breathe in the steam from hot water coming from a  faucet.  Avoid close contacts especially the very young and the elderly.  Cover your mouth when you cough or sneeze.  Always remember to wash your hands.  Get Help Right Away If:  You develop worsening fever or sinus pain.  You develop a severe head ache or visual changes.  Your symptoms persist after you have completed your treatment plan.  Make sure you  Understand these instructions.  Will watch your condition.  Will get help right away if you are not doing well or get worse.  Your e-visit answers were reviewed by a board certified advanced clinical practitioner to complete your personal care plan.  Depending on the condition, your plan could have included both over the counter or prescription medications.  If there is a problem please reply  once you have received a response from your provider.  Your safety is important to [place].  If you have drug allergies check your prescription carefully.    You can use MyChart to ask questions about today's visit, request a non-urgent call back, or ask for a work or school excuse for 24 hours related to this e-Visit. If it has been greater than 24 hours you will need to follow up with your provider, or enter a new e-Visit to address those concerns.  You will get an e-mail in the next two days asking about your experience.  I hope that your e-visit has been valuable and will speed your  recovery. Thank you for using e-visits.

## 2017-09-06 DIAGNOSIS — B078 Other viral warts: Secondary | ICD-10-CM | POA: Diagnosis not present

## 2017-09-07 DIAGNOSIS — G4733 Obstructive sleep apnea (adult) (pediatric): Secondary | ICD-10-CM | POA: Diagnosis not present

## 2017-09-15 DIAGNOSIS — R7302 Impaired glucose tolerance (oral): Secondary | ICD-10-CM | POA: Insufficient documentation

## 2017-09-15 NOTE — Patient Instructions (Signed)
Was a pleasure to see you today.  Continue to work on diet exercise and weight loss and continue same medications.

## 2017-09-15 NOTE — Progress Notes (Signed)
   Subjective:    Patient ID: Connie Hall, female    DOB: [birth year], 52 y.o.   MRN: [id number]  HPI She is here today for 49-month follow-up.  She has sleep apnea and uses CPAP apparatus.  She has hyperlipidemia,  impaired glucose tolerance, dependent edema, restless leg syndrome.  Continues to try to lose weight.  Does not get a lot of exercise.  She has lost 5 pounds since June.  Fasting lipid panel shows normal total cholesterol and LDL cholesterol.  However triglycerides are elevated at 178 and previously 6 months ago were 168.  Hemoglobin A1c excellent at 6%    Review of Systems see above-has issues with sinusitis from time to time     Objective:   Physical Exam Skin warm and dry.  Nodes none.  Neck is supple.  Chest clear.  No carotid bruits.  Cardiac exam regular rate and rhythm normal S1 and 2.  Extremities without edema.       Assessment & Plan:  Sleep apnea  BMI 47.26 continue to work on diet and exercise  Restless leg syndrome  Allergic rhinitis and recurrent sinusitis  Hyperlipidemia-treated with Lipitor 10 mg daily.  Did not adjust dose today.  Impaired glucose tolerance-stable at 6%  Dependent edema-stable  Plan: Return in 6 months for physical exam.  Continue to work on diet exercise and weight loss.  Continue same medications.

## 2017-09-30 ENCOUNTER — Other Ambulatory Visit: Payer: Self-pay | Admitting: Internal Medicine

## 2017-09-30 MED FILL — rOPINIRole HCL 0.5 MG TABS: 0.5 | 90 days supply | Qty: 270 | Fill #0

## 2017-09-30 MED FILL — ATORVASTATIN 10 MG TABLET: 10 | 90 days supply | Qty: 90 | Fill #0

## 2017-10-07 DIAGNOSIS — G4733 Obstructive sleep apnea (adult) (pediatric): Secondary | ICD-10-CM | POA: Diagnosis not present

## 2017-10-08 DIAGNOSIS — G4733 Obstructive sleep apnea (adult) (pediatric): Secondary | ICD-10-CM | POA: Diagnosis not present

## 2017-10-21 ENCOUNTER — Telehealth: Payer: 59 | Admitting: Family

## 2017-10-21 DIAGNOSIS — J00 Acute nasopharyngitis [common cold]: Secondary | ICD-10-CM

## 2017-10-21 MED ORDER — BENZONATATE 100 MG PO CAPS: 100.0000 mg | ORAL_CAPSULE | Freq: Three times a day (TID) | ORAL | 0 refills | Status: DC | PRN

## 2017-10-21 MED ORDER — PREDNISONE 5 MG PO TABS: 5.0000 mg | ORAL_TABLET | ORAL | 0 refills | Status: DC

## 2017-10-21 MED ORDER — FLUTICASONE PROPIONATE 50 MCG/ACT NA SUSP: 2.0000 | Freq: Every day | NASAL | 1 refills | Status: DC

## 2017-10-21 MED FILL — FLUTICASONE PROP 50 MCG SPR: 50 | 30 days supply | Qty: 16 | Fill #0

## 2017-10-21 MED FILL — predniSONE 5 MG TABS: 5 | 6 days supply | Qty: 21 | Fill #0

## 2017-10-21 MED FILL — BENZONATATE 100 MG CAPS: 100 | 5 days supply | Qty: 30 | Fill #0

## 2017-10-21 NOTE — Progress Notes (Signed)
Thank you for the details you included in the comment boxes. Those details are very helpful in determining the best course of treatment for you and help [place] to provide the best care.   We are sorry you are not feeling well.  Here is how we plan to help!  Based on what you have shared with me, it looks like you may have a viral upper respiratory infection or a "common cold".  Colds are caused by a large number of viruses; however, rhinovirus is the most common cause.   Symptoms of the common cold vary from person to person, with common symptoms including sore throat, cough, and malaise.  A low-grade fever of 100.4 may present, but is often uncommon.  Symptoms vary however, and are closely related to a person's age or underlying illnesses.  The most common symptoms associated with the common cold are nasal discharge or congestion, cough, sneezing, headache and pressure in the ears and face.  Cold symptoms usually persist for about 3 to 10 days, but can last up to 2 weeks.  It is important to know that colds do not cause serious illness or complications in most cases.    The common cold is transmitted from person to person, with the most common method of transmission being a person's hands.  The virus is able to live on the skin and can infect other persons for up to 2 hours after direct contact.  Also, colds are transmitted when someone coughs or sneezes; thus, it is important to cover the mouth to reduce this risk.  To keep the spread of the common cold at [place], good hand hygiene is very important.  This is an infection that is most likely caused by a virus. There are no specific treatments for the common cold other than to help you with the symptoms until the infection runs its course.    For nasal congestion, you may use an oral decongestants such as Mucinex D or if you have glaucoma or high blood pressure use plain Mucinex.  Saline nasal spray or nasal drops can help and can safely be used as often as  needed for congestion.  For your congestion, I have prescribed Fluticasone nasal spray one spray in each nostril twice a day  If you do not have a history of heart disease, hypertension, diabetes or thyroid disease, prostate/bladder issues or glaucoma, you may also use Sudafed to treat nasal congestion.  It is highly recommended that you consult with a pharmacist or your primary care physician to ensure this medication is safe for you to take.     If you have a cough, you may use cough suppressants such as Delsym and Robitussin.  If you have glaucoma or high blood pressure, you can also use Coricidin HBP.   For cough I have prescribed for you A prescription cough medication called Tessalon Perles 100 mg. You may take 1-2 capsules every 8 hours as needed for cough   Due to your severe sinus congestion, I am also sending a low-dose prednisone pack to help open up your sinus passages to breathe better.   If you have a sore or scratchy throat, use a saltwater gargle-  to  teaspoon of salt dissolved in a 4-ounce to 8-ounce glass of warm water.  Gargle the solution for approximately 15-30 seconds and then spit.  It is important not to swallow the solution.  You can also use throat lozenges/cough drops and Chloraseptic spray to help with throat pain  or discomfort.  Warm or cold liquids can also be helpful in relieving throat pain.  For headache, pain or general discomfort, you can use Ibuprofen or Tylenol as directed.   Some authorities believe that zinc sprays or the use of Echinacea may shorten the course of your symptoms.   HOME CARE . Only take medications as instructed by your medical team. . Be sure to drink plenty of fluids. Water is fine as well as fruit juices, sodas and electrolyte beverages. You may want to stay away from caffeine or alcohol. If you are nauseated, try taking small sips of liquids. How do you know if you are getting enough fluid? Your urine should be a pale yellow or almost  colorless. . Get rest. . Taking a steamy shower or using a humidifier may help nasal congestion and ease sore throat pain. You can place a towel over your head and breathe in the steam from hot water coming from a faucet. . Using a saline nasal spray works much the same way. . Cough drops, hard candies and sore throat lozenges may ease your cough. . Avoid close contacts especially the very young and the elderly . Cover your mouth if you cough or sneeze . Always remember to wash your hands.   GET HELP RIGHT AWAY IF: . You develop worsening fever. . If your symptoms do not improve within 10 days . You become short of breath. . You develop yellow or green discharge from your nose over 3 days. . You have coughing fits . You develop a severe head ache or visual changes. . You develop shortness of breath or difficulty breathing. . Your symptoms persist after you have completed your treatment plan  MAKE SURE YOU   Understand these instructions.  Will watch your condition.  Will get help right away if you are not doing well or get worse.  Your e-visit answers were reviewed by a board certified advanced clinical practitioner to complete your personal care plan. Depending upon the condition, your plan could have included both over the counter or prescription medications. Please review your pharmacy choice. If there is a problem, you may call our nursing hot line at and have the prescription routed to another pharmacy. Your safety is important to [place]. If you have drug allergies check your prescription carefully.   You can use MyChart to ask questions about today's visit, request a non-urgent call back, or ask for a work or school excuse for 24 hours related to this e-Visit. If it has been greater than 24 hours you will need to follow up with your provider, or enter a new e-Visit to address those concerns. You will get an e-mail in the next two days asking about your experience.  I hope that your  e-visit has been valuable and will speed your recovery. Thank you for using e-visits.

## 2017-10-22 ENCOUNTER — Encounter: Payer: Self-pay | Admitting: Internal Medicine

## 2017-10-22 ENCOUNTER — Ambulatory Visit (INDEPENDENT_AMBULATORY_CARE_PROVIDER_SITE_OTHER): Payer: 59 | Admitting: Internal Medicine

## 2017-10-22 VITALS — BP 120/88 | HR 102 | Temp 98.1°F | Ht 59.0 in | Wt 230.0 lb

## 2017-10-22 DIAGNOSIS — H52223 Regular astigmatism, bilateral: Secondary | ICD-10-CM | POA: Diagnosis not present

## 2017-10-22 DIAGNOSIS — H524 Presbyopia: Secondary | ICD-10-CM | POA: Diagnosis not present

## 2017-10-22 DIAGNOSIS — J0100 Acute maxillary sinusitis, unspecified: Secondary | ICD-10-CM | POA: Diagnosis not present

## 2017-10-22 DIAGNOSIS — H5213 Myopia, bilateral: Secondary | ICD-10-CM | POA: Diagnosis not present

## 2017-10-22 DIAGNOSIS — H6692 Otitis media, unspecified, left ear: Secondary | ICD-10-CM | POA: Diagnosis not present

## 2017-10-22 MED ORDER — AZITHROMYCIN 250 MG PO TABS: ORAL_TABLET | ORAL | 0 refills | Status: DC

## 2017-10-22 MED FILL — AZITHROMYCIN 250 MG TABLET: 250 | 5 days supply | Qty: 6 | Fill #0

## 2017-10-22 NOTE — Progress Notes (Signed)
   Subjective:    Patient ID: Connie Hall, female    DOB: [birth year], 52 y.o.   MRN: [id number]  HPI She just returned from a [place] cruise.  She had an E- visit yesterday for sinus pressure and congestion.  Prednisone was prescribed for sinus congestion.  No fever or shaking chills.  No myalgias.  She did take flu vaccine.  She sounds nasally congested when she speaks.  She had similar episode in October treated with Z-Pak.  She had another episode in early January treated with Augmentin by E- visit  Review of Systems has left ear pain described as fairly severe     Objective:   Physical Exam  HENT:  Head: Normocephalic and atraumatic.  She has an acute left otitis media.  Left TM is red and slightly retracted and dull.  Right TM is slightly full and dull and pink.  Pharynx is red without exudate  Eyes: Right eye exhibits no discharge. Left eye exhibits no discharge.  Neck: Neck supple. No thyromegaly present.  Pulmonary/Chest: She has no wheezes. She has no rales.  Lymphadenopathy:    She has no cervical adenopathy.          Assessment & Plan:  Acute left otitis media  Acute maxillary sinusitis  Plan: Take prednisone as directed.  Prescribed Zithromax Z-Pak take 2 tablets day 1 followed by 1 tablet days 2 through 5.  Call if not better in 7 days or sooner if worse.  Rest and drink plenty of fluids.

## 2017-10-22 NOTE — Patient Instructions (Signed)
Take Zithromax Z-Pak as directed along with prednisone prescribed by E visit personnel.  Rest and drink plenty of fluids.

## 2017-11-05 DIAGNOSIS — G4733 Obstructive sleep apnea (adult) (pediatric): Secondary | ICD-10-CM | POA: Diagnosis not present

## 2017-11-19 ENCOUNTER — Telehealth: Payer: Self-pay | Admitting: Internal Medicine

## 2017-11-19 ENCOUNTER — Encounter: Payer: Self-pay | Admitting: Internal Medicine

## 2017-11-19 NOTE — Telephone Encounter (Signed)
Patient states that back around 2012 she remembers you giving her a Rx for a water pill 05/11/2011.  She never filled it but as she has gotten older she can tell that she feels she needs it.  She is noticing the swelling in her feet, ankles and hands.  She is noticing a tightness.  She cannot really tell a difference in the morning when she awakes in the swelling.    Do you want her to come back into the office for a visit to assess this?    Pharmacy:  [place]  Phone:  [phone]

## 2017-11-19 NOTE — Telephone Encounter (Signed)
She needs appointment. She sent My Chart message and I told her to call. This is a long time ago and needs reassessment. Not urgent can see next week.

## 2017-11-21 NOTE — Telephone Encounter (Signed)
Called patient back and offered appointment for Friday afternoon, patient would rather look at next week.  Appointment given for Monday, 11/25/17 @ 9:45 and confirmed.

## 2017-11-25 ENCOUNTER — Ambulatory Visit
Admission: RE | Admit: 2017-11-25 | Discharge: 2017-11-25 | Disposition: A | Discharge status: Home / Self Care | Payer: 59 | Source: Ambulatory Visit | Attending: Internal Medicine | Admitting: Internal Medicine

## 2017-11-25 ENCOUNTER — Encounter: Payer: Self-pay | Admitting: Internal Medicine

## 2017-11-25 ENCOUNTER — Ambulatory Visit (INDEPENDENT_AMBULATORY_CARE_PROVIDER_SITE_OTHER): Payer: 59 | Admitting: Internal Medicine

## 2017-11-25 ENCOUNTER — Telehealth: Payer: Self-pay

## 2017-11-25 VITALS — BP 120/80 | HR 84 | Temp 98.1°F | Ht 59.5 in | Wt 236.0 lb

## 2017-11-25 DIAGNOSIS — Z6841 Body Mass Index (BMI) 40.0 and over, adult: Secondary | ICD-10-CM

## 2017-11-25 DIAGNOSIS — R609 Edema, unspecified: Secondary | ICD-10-CM | POA: Diagnosis not present

## 2017-11-25 DIAGNOSIS — R7302 Impaired glucose tolerance (oral): Secondary | ICD-10-CM | POA: Diagnosis not present

## 2017-11-25 DIAGNOSIS — R600 Localized edema: Secondary | ICD-10-CM | POA: Diagnosis not present

## 2017-11-25 DIAGNOSIS — R011 Cardiac murmur, unspecified: Secondary | ICD-10-CM

## 2017-11-25 NOTE — Telephone Encounter (Signed)
Called patient and provided with ECHO appt made on 11/29/17 @ 9:30. Pt verbalized understanding.

## 2017-11-25 NOTE — Progress Notes (Addendum)
   Subjective:    Patient ID: Connie Hall, female    DOB: 1965-12-31, 52 y.o.   MRN: [id number]  HPI She sent my chart message recently describing edema issues and I asked that she come in.  Apparently is been going on now for a number of weeks.  She is noticed her feet and hand swelling and fluid retention issues.  Her going to check some lab work including CBC with diif,C met ,TSH and BNP.  She will be scheduled for 2D echocardiogram.  History of impaired glucose tolerance.  BMI is 46.87.  He is maintained on Lipitor.  Also takes Requip for restless leg syndrome.    Review of Systems     Objective:   Physical Exam Blood pressure is excellent 120/80.  Weight is 236 pounds.  Weight was 239 pounds in June 2018 and 234 pounds in December 2018.  Weight was 230 pounds in February.  Skin warm and dry.  Nodes none.  Neck is supple without JVD thyromegaly or carotid bruits.  Chest clear.  Cardiac exam regular rate and rhythm 1/6 to 2/6 systolic ejection murmur.  She has puffiness in her hands and nonpitting edema of her feet       Assessment & Plan:  Edema-etiology unclear  Plan: See plan above regarding echocardiogram and lab work as well as chest x-ray  Addendum: Chest x-ray is negative.  TSH and BNP are normal as are CBC and complete metabolic panel.  Start Lasix 20 mg daily and follow-up  April 9  Addendum: 11/29/2017-2D echocardiogram within normal limits

## 2017-11-26 ENCOUNTER — Other Ambulatory Visit: Payer: 59 | Admitting: Internal Medicine

## 2017-11-26 DIAGNOSIS — R609 Edema, unspecified: Secondary | ICD-10-CM

## 2017-11-27 ENCOUNTER — Other Ambulatory Visit: Payer: Self-pay

## 2017-11-27 LAB — CBC WITH DIFFERENTIAL/PLATELET
BASOS PCT: 0.5 %
Basophils Absolute: 42 cells/uL (ref 0–200)
EOS ABS: 143 {cells}/uL (ref 15–500)
Eosinophils Relative: 1.7 %
HEMATOCRIT: 38 % (ref 35.0–45.0)
Hemoglobin: 12.7 g/dL (ref 11.7–15.5)
LYMPHS ABS: 1848 {cells}/uL (ref 850–3900)
MCH: 29.1 pg (ref 27.0–33.0)
MCHC: 33.4 g/dL (ref 32.0–36.0)
MCV: 87 fL (ref 80.0–100.0)
MPV: 9.7 fL (ref 7.5–12.5)
Monocytes Relative: 5.1 %
NEUTROS PCT: 70.7 %
Neutro Abs: 5939 cells/uL (ref 1500–7800)
PLATELETS: 304 10*3/uL (ref 140–400)
RBC: 4.37 10*6/uL (ref 3.80–5.10)
RDW: 13.4 % (ref 11.0–15.0)
TOTAL LYMPHOCYTE: 22 %
WBC mixed population: 428 cells/uL (ref 200–950)
WBC: 8.4 10*3/uL (ref 3.8–10.8)

## 2017-11-27 LAB — COMPLETE METABOLIC PANEL WITH GFR
AG Ratio: 1.3 (calc) (ref 1.0–2.5)
ALBUMIN MSPROF: 3.9 g/dL (ref 3.6–5.1)
ALT: 13 U/L (ref 6–29)
AST: 13 U/L (ref 10–35)
Alkaline phosphatase (APISO): 53 U/L (ref 33–115)
BUN: 11 mg/dL (ref 7–25)
CALCIUM: 8.9 mg/dL (ref 8.6–10.2)
CO2: 25 mmol/L (ref 20–32)
CREATININE: 0.77 mg/dL (ref 0.50–1.10)
Chloride: 110 mmol/L (ref 98–110)
GFR, EST NON AFRICAN AMERICAN: 91 mL/min/{1.73_m2} (ref >=60)
GFR, Est African American: 105 mL/min/{1.73_m2} (ref >=60)
GLOBULIN: 3.1 g/dL (ref 1.9–3.7)
GLUCOSE: 96 mg/dL (ref 65–139)
Potassium: 4.3 mmol/L (ref 3.5–5.3)
SODIUM: 134 mmol/L — AB (ref 135–146)
Total Bilirubin: 0.4 mg/dL (ref 0.2–1.2)
Total Protein: 7 g/dL (ref 6.1–8.1)

## 2017-11-27 LAB — BRAIN NATRIURETIC PEPTIDE: Brain Natriuretic Peptide: 20 pg/mL (ref <100)

## 2017-11-27 LAB — TSH: TSH: 1.71 mIU/L

## 2017-11-27 MED ORDER — FUROSEMIDE 20 MG PO TABS: 20.0000 mg | ORAL_TABLET | Freq: Every day | ORAL | 0 refills | Status: DC

## 2017-11-27 MED FILL — FUROSEMIDE 20 MG TABS: 20 | 30 days supply | Qty: 30 | Fill #0

## 2017-11-29 ENCOUNTER — Ambulatory Visit (HOSPITAL_COMMUNITY): Payer: 59 | Attending: Cardiovascular Disease

## 2017-11-29 ENCOUNTER — Other Ambulatory Visit: Payer: Self-pay

## 2017-11-29 DIAGNOSIS — E669 Obesity, unspecified: Secondary | ICD-10-CM | POA: Insufficient documentation

## 2017-11-29 DIAGNOSIS — R609 Edema, unspecified: Secondary | ICD-10-CM | POA: Diagnosis not present

## 2017-11-29 DIAGNOSIS — Z6841 Body Mass Index (BMI) 40.0 and over, adult: Secondary | ICD-10-CM | POA: Diagnosis not present

## 2017-11-29 DIAGNOSIS — E785 Hyperlipidemia, unspecified: Secondary | ICD-10-CM | POA: Insufficient documentation

## 2017-11-29 DIAGNOSIS — R011 Cardiac murmur, unspecified: Secondary | ICD-10-CM

## 2017-12-03 ENCOUNTER — Ambulatory Visit (INDEPENDENT_AMBULATORY_CARE_PROVIDER_SITE_OTHER): Payer: 59 | Admitting: Internal Medicine

## 2017-12-03 ENCOUNTER — Encounter: Payer: Self-pay | Admitting: Internal Medicine

## 2017-12-03 VITALS — BP 120/80 | HR 88 | Ht 59.5 in

## 2017-12-03 DIAGNOSIS — R609 Edema, unspecified: Secondary | ICD-10-CM | POA: Diagnosis not present

## 2017-12-03 MED ORDER — FUROSEMIDE 40 MG PO TABS: 40.0000 mg | ORAL_TABLET | Freq: Every day | ORAL | 3 refills | Status: DC

## 2017-12-03 NOTE — Progress Notes (Signed)
   Subjective:    Patient ID: Connie Hall, female    DOB: [birth year], 52 y.o.   MRN: [id number]  HPI In today to follow-up on the edema of hands and feet.  Her work-up which was substantial was negative including 2D echocardiogram to evaluate murmur and chest x-ray as well as lab work including a BNP and TSH.  These were all normal.  Has been on Lasix 20 mg daily and would like to increase dose.    Review of Systems see above     Objective:   Physical Exam  Chest clear to auscultation.  Cardiac exam: Regular rate and rhythm murmur unchanged.  Extremities: Trace pitting edema      Assessment & Plan:   The murmur that I heard is clearly a flow murmur based on  normal 2D echocardiogram  Edema of hands and feet  Plan: Change Lasix to 40 mg daily.  Therefore, she will need follow-up basic metabolic panel in few days.  Potassium was 4.2 prior to starting diuretic.

## 2017-12-06 DIAGNOSIS — G4733 Obstructive sleep apnea (adult) (pediatric): Secondary | ICD-10-CM | POA: Diagnosis not present

## 2017-12-10 MED FILL — FUROSEMIDE 40 MG TAB: 40 | 10 days supply | Qty: 10 | Fill #0

## 2017-12-11 ENCOUNTER — Other Ambulatory Visit: Payer: Self-pay | Admitting: Internal Medicine

## 2017-12-11 DIAGNOSIS — E785 Hyperlipidemia, unspecified: Secondary | ICD-10-CM

## 2017-12-12 ENCOUNTER — Other Ambulatory Visit: Payer: 59 | Admitting: Internal Medicine

## 2017-12-19 ENCOUNTER — Other Ambulatory Visit: Payer: 59 | Admitting: Internal Medicine

## 2017-12-19 ENCOUNTER — Encounter (INDEPENDENT_AMBULATORY_CARE_PROVIDER_SITE_OTHER): Payer: 59

## 2017-12-20 ENCOUNTER — Other Ambulatory Visit: Payer: 59 | Admitting: Internal Medicine

## 2017-12-20 DIAGNOSIS — E785 Hyperlipidemia, unspecified: Secondary | ICD-10-CM

## 2017-12-20 LAB — BASIC METABOLIC PANEL
BUN: 11 mg/dL (ref 7–25)
CALCIUM: 9.3 mg/dL (ref 8.6–10.2)
CHLORIDE: 101 mmol/L (ref 98–110)
CO2: 28 mmol/L (ref 20–32)
Creat: 0.83 mg/dL (ref 0.50–1.10)
Glucose, Bld: 125 mg/dL (ref 65–139)
POTASSIUM: 4.7 mmol/L (ref 3.5–5.3)
Sodium: 137 mmol/L (ref 135–146)

## 2017-12-22 NOTE — Patient Instructions (Signed)
Lab work reviewed.  Start Lasix 40 mg daily.  Follow-up April 9.

## 2017-12-22 NOTE — Patient Instructions (Signed)
Increase Lasix to 40 mg daily and follow-up in a week or so with another B-met on increased dose of Lasix.

## 2017-12-25 ENCOUNTER — Ambulatory Visit (INDEPENDENT_AMBULATORY_CARE_PROVIDER_SITE_OTHER): Payer: 59 | Admitting: Family Medicine

## 2017-12-25 ENCOUNTER — Encounter (INDEPENDENT_AMBULATORY_CARE_PROVIDER_SITE_OTHER): Payer: Self-pay | Admitting: Family Medicine

## 2017-12-25 VITALS — BP 113/71 | HR 81 | Temp 98.4°F | Ht <= 58 in | Wt 232.0 lb

## 2017-12-25 DIAGNOSIS — R5383 Other fatigue: Secondary | ICD-10-CM | POA: Diagnosis not present

## 2017-12-25 DIAGNOSIS — E66813 Obesity, class 3: Secondary | ICD-10-CM

## 2017-12-25 DIAGNOSIS — Z6841 Body Mass Index (BMI) 40.0 and over, adult: Secondary | ICD-10-CM

## 2017-12-25 DIAGNOSIS — E7849 Other hyperlipidemia: Secondary | ICD-10-CM | POA: Diagnosis not present

## 2017-12-25 DIAGNOSIS — Z9189 Other specified personal risk factors, not elsewhere classified: Secondary | ICD-10-CM

## 2017-12-25 DIAGNOSIS — Z1331 Encounter for screening for depression: Secondary | ICD-10-CM

## 2017-12-25 DIAGNOSIS — Z0289 Encounter for other administrative examinations: Secondary | ICD-10-CM

## 2017-12-25 DIAGNOSIS — R7303 Prediabetes: Secondary | ICD-10-CM

## 2017-12-25 DIAGNOSIS — E559 Vitamin D deficiency, unspecified: Secondary | ICD-10-CM

## 2017-12-25 DIAGNOSIS — R0600 Dyspnea, unspecified: Secondary | ICD-10-CM

## 2017-12-25 DIAGNOSIS — R0609 Other forms of dyspnea: Secondary | ICD-10-CM | POA: Diagnosis not present

## 2017-12-26 LAB — FOLATE: Folate: 7.3 ng/mL (ref >3.0)

## 2017-12-26 LAB — HEMOGLOBIN A1C
Est. average glucose Bld gHb Est-mCnc: 131 mg/dL
HEMOGLOBIN A1C: 6.2 % — AB (ref 4.8–5.6)

## 2017-12-26 LAB — COMPREHENSIVE METABOLIC PANEL
ALBUMIN: 4 g/dL (ref 3.5–5.5)
ALK PHOS: 55 IU/L (ref 39–117)
ALT: 14 IU/L (ref 0–32)
AST: 12 IU/L (ref 0–40)
Albumin/Globulin Ratio: 1.3 (ref 1.2–2.2)
BILIRUBIN TOTAL: 0.3 mg/dL (ref 0.0–1.2)
BUN / CREAT RATIO: 17 (ref 9–23)
BUN: 14 mg/dL (ref 6–24)
CHLORIDE: 102 mmol/L (ref 96–106)
CO2: 21 mmol/L (ref 20–29)
Calcium: 9.1 mg/dL (ref 8.7–10.2)
Creatinine, Ser: 0.82 mg/dL (ref 0.57–1.00)
GFR calc non Af Amer: 84 mL/min/{1.73_m2} (ref >59)
GFR, EST AFRICAN AMERICAN: 97 mL/min/{1.73_m2} (ref >59)
GLOBULIN, TOTAL: 3 g/dL (ref 1.5–4.5)
Glucose: 112 mg/dL — ABNORMAL HIGH (ref 65–99)
Potassium: 4.8 mmol/L (ref 3.5–5.2)
SODIUM: 140 mmol/L (ref 134–144)
Total Protein: 7 g/dL (ref 6.0–8.5)

## 2017-12-26 LAB — LIPID PANEL WITH LDL/HDL RATIO
Cholesterol, Total: 171 mg/dL (ref 100–199)
HDL: 31 mg/dL — ABNORMAL LOW (ref >39)
LDL CALC: 78 mg/dL (ref 0–99)
LDL/HDL RATIO: 2.5 ratio (ref 0.0–3.2)
Triglycerides: 308 mg/dL — ABNORMAL HIGH (ref 0–149)
VLDL CHOLESTEROL CAL: 62 mg/dL — AB (ref 5–40)

## 2017-12-26 LAB — VITAMIN D 25 HYDROXY (VIT D DEFICIENCY, FRACTURES): Vit D, 25-Hydroxy: 34.8 ng/mL (ref 30.0–100.0)

## 2017-12-26 LAB — INSULIN, RANDOM: INSULIN: 23 u[IU]/mL (ref 2.6–24.9)

## 2017-12-26 LAB — VITAMIN B12: Vitamin B-12: 362 pg/mL (ref 232–1245)

## 2017-12-27 MED FILL — rOPINIRole HCL 0.5 MG TABS: 0.5 | 90 days supply | Qty: 270 | Fill #1

## 2017-12-27 MED FILL — ATORVASTATIN 10 MG TABLET: 10 | 90 days supply | Qty: 90 | Fill #1

## 2017-12-27 MED FILL — FUROSEMIDE 40 MG TAB: 40 | 90 days supply | Qty: 90 | Fill #1

## 2017-12-27 MED FILL — FLUTICASONE PROP 50 MCG SPR: 50 | 30 days supply | Qty: 16 | Fill #1

## 2018-01-01 NOTE — Progress Notes (Signed)
Office: [phone]  /  Fax: [phone]   Dear Dr. Renold Genta,   Thank you for referring Connie Hall to our clinic. The following note includes my evaluation and treatment recommendations.  HPI:   Chief Complaint: OBESITY    Connie Hall has been referred by Connie Hall. Renold Genta, MD for consultation regarding her obesity and obesity related comorbidities.    Connie Hall (MR# [id number]) is a 52 y.o. female who presents on 12/25/2017 for obesity evaluation and treatment. Current BMI is Body mass index is 48.49 kg/m.Connie Hall Connie Hall has been struggling with her weight for many years and has been unsuccessful in either losing weight, maintaining weight loss, or reaching her healthy weight goal.     Connie Hall attended our information session and states she is currently in the action stage of change and ready to dedicate time achieving and maintaining a healthier weight. Connie Hall is interested in becoming our patient and working on intensive lifestyle modifications including (but not limited to) diet, exercise and weight loss.    Connie Hall states her family eats meals together she thinks her family will eat healthier with  her her desired weight loss is 82-92 lbs she started gaining weight in mid 30's her heaviest weight ever was 230/240 lbs she has significant food cravings issues  she skips meals frequently she frequently makes poor food choices she has problems with excessive hunger  she frequently eats larger portions than normal  she struggles with emotional eating    Fatigue Becci feels her energy is lower than it should be. This has worsened with weight gain and has not worsened recently. Lakyn admits to daytime somnolence and  denies waking up still tired. Patient has a history of obstructive sleep apnea with the use of CPAP. Patent has a history of symptoms of daytime fatigue. Patient generally gets 8 hours of sleep per night, and states they generally have generally restful sleep. Snoring is  present. Apneic episodes are not present. Epworth Sleepiness Score is 1.  Dyspnea on exertion Connie Hall notes increasing shortness of breath with exercising and seems to be worsening over time with weight gain. She notes getting out of breath sooner with activity than she used to. This has not gotten worse recently. Connie Hall denies orthopnea.  Vitamin D Deficiency Connie Hall has a diagnosis of vitamin D deficiency. She is on OTC Vit D, last level not at goal. She notes fatigue and denies nausea, vomiting or muscle weakness.  Pre-Diabetes Connie Hall has a diagnosis of pre-diabetes based on her elevated Hgb A1c and was informed this puts her at greater risk of developing diabetes. She has a history of elevated A1c and she notes polyphagia. She is not taking metformin currently and continues to work on diet and exercise to decrease risk of diabetes. She denies nausea or hypoglycemia.  At risk for diabetes Melisssa is at higher than average risk for developing diabetes due to her obesity and pre-diabetes. She currently denies polyuria or polydipsia.  Hyperlipidemia Connie Hall has hyperlipidemia and has been trying to improve her cholesterol levels with intensive lifestyle modification including a low saturated fat diet, exercise and weight loss. She is on lipitor and she denies any chest pain, claudication or myalgias.  Depression Screen Connie Hall's Food and Mood (modified PHQ-9) score was  Depression screen PHQ 2/9 12/25/2017  Decreased Interest 3  Down, Depressed, Hopeless 1  PHQ - 2 Score 4  Altered sleeping 0  Tired, decreased energy 2  Change in appetite 3  Feeling bad or failure about yourself  2  Trouble concentrating 0  Moving slowly or fidgety/restless 3  Suicidal thoughts 0  PHQ-9 Score 14  Difficult doing work/chores Somewhat difficult    ALLERGIES: Allergies  Allergen Reactions  . Sulfonamide Derivatives Hives    MEDICATIONS: Current Outpatient Medications on File Prior to Visit    Medication Sig Dispense Refill  . ALLEGRA-D ALLERGY & CONGESTION 60-120 MG 12 hr tablet TAKE 1 TABLET BY MOUTH TWICE A DAY 180 tablet 3  . atorvastatin (LIPITOR) 10 MG tablet TAKE 1 TABLET BY MOUTH ONCE DAILY 90 tablet 3  . Cholecalciferol (VITAMIN D) 1000 UNITS capsule Take 1,000 Units by mouth daily.     . fluticasone (FLONASE) 50 MCG/ACT nasal spray Place 2 sprays into both nostrils daily. 16 g 1  . furosemide (LASIX) 40 MG tablet Take 1 tablet (40 mg total) by mouth daily. 30 tablet 3  . Multiple Vitamins-Minerals (MULTIVITAMIN WITH MINERALS) tablet Take 1 tablet by mouth daily.    Connie Hall rOPINIRole (REQUIP) 0.5 MG tablet TAKE 1 TABLET BY MOUTH IN THE AFTERNOON AND 2 TABLETS BY MOUTH AT BEDTIME 270 tablet 11  . vitamin C (ASCORBIC ACID) 500 MG tablet Take 500 mg by mouth daily.    Connie Hall zinc gluconate 50 MG tablet Take 50 mg by mouth daily.     No current facility-administered medications on file prior to visit.     PAST MEDICAL HISTORY: Past Medical History:  Diagnosis Date  . Back pain   . Deviated nasal septum   . Discoid lupus   . DM (diabetes mellitus) ([place])   . Family history of pancreatic cancer   . Family history of prostate cancer   . History of discoid lupus erythematosus   . Hyperlipidemia   . Leg edema   . Obesity   . OSA (obstructive sleep apnea)   . Prediabetes   . Restless legs syndrome (RLS)   . Vitamin D deficiency     PAST SURGICAL HISTORY: Past Surgical History:  Procedure Laterality Date  . ROTATOR CUFF REPAIR     left  . Sebaceous cyst removal, back  03/2008    SOCIAL HISTORY: Social History   Tobacco Use  . Smoking status: Former Smoker    Packs/day: 1.00    Years: 15.00    Pack years: 15.00    Types: Cigarettes    Last attempt to quit: 08/27/1998    Years since quitting: 19.3  . Smokeless tobacco: Never Used  Substance Use Topics  . Alcohol use: Yes    Comment: rare  . Drug use: No    FAMILY HISTORY: Family History  Problem Relation Age of  Onset  . Hypertension Father   . Stroke Father   . Heart disease Father   . Diabetes Father   . Pancreatic cancer Father 30  . Prostate cancer Father        dx in his 18s; prostatectomy  . Sleep apnea Father   . Obesity Father   . Skin cancer Mother   . Hyperlipidemia Mother   . Pancreatic cancer Sister 62       neuroendocrine tumor  . Colon polyps Sister   . Congestive Heart Failure Maternal Aunt   . Dementia Maternal Uncle   . Lung cancer Paternal Aunt   . Heart attack Paternal Uncle   . Colon polyps Sister   . Colon polyps Sister   . Pancreatic cancer Paternal Uncle 46  . Testicular cancer Cousin        paternal  first cousin dx in his 33s    ROS: Review of Systems  Constitutional: Positive for malaise/fatigue. Negative for weight loss.  Eyes:       + Wear glasses or contacts  Respiratory: Positive for shortness of breath (with exertion).   Cardiovascular: Negative for chest pain, orthopnea and claudication.       + Calf/leg pain with walking  Gastrointestinal: Negative for nausea and vomiting.  Genitourinary: Negative for frequency.  Musculoskeletal: Negative for myalgias.       Negative muscle weakness  Endo/Heme/Allergies: Negative for polydipsia.       Positive polyphagia Negative hypoglycemia    PHYSICAL EXAM: Blood pressure 113/71, pulse 81, temperature 98.4 F (36.9 C), temperature source Oral, height 4\' 10"  (1.473 m), weight 232 lb (105.2 kg), last menstrual period 12/14/2017, SpO2 94 %. Body mass index is 48.49 kg/m. Physical Exam  Constitutional: She is oriented to person, place, and time. She appears well-developed and well-nourished.  HENT:  Head: Normocephalic and atraumatic.  Nose: Nose normal.  Eyes: EOM are normal. No scleral icterus.  Neck: Normal range of motion. Neck supple. No thyromegaly present.  Cardiovascular: Normal rate and regular rhythm.  Pulmonary/Chest: Effort normal. No respiratory distress.  Abdominal: Soft. There is no  tenderness.  + Obesity  Musculoskeletal:  Range of Motion normal in all 4 extremities Trace edema noted in bilateral lower extremities  Neurological: She is alert and oriented to person, place, and time. Coordination normal.  Skin: Skin is warm and dry.  Psychiatric: She has a normal mood and affect. Her behavior is normal.  Vitals reviewed.   RECENT LABS AND TESTS: BMET    Component Value Date/Time   NA 140 12/25/2017 0923   K 4.8 12/25/2017 0923   CL 102 12/25/2017 0923   CO2 21 12/25/2017 0923   GLUCOSE 112 (H) 12/25/2017 0923   GLUCOSE 125 12/20/2017 0937   BUN 14 12/25/2017 0923   CREATININE 0.82 12/25/2017 0923   CREATININE 0.83 12/20/2017 0937   CALCIUM 9.1 12/25/2017 0923   GFRNONAA 84 12/25/2017 0923   GFRNONAA 91 11/26/2017 0958   GFRAA 97 12/25/2017 0923   GFRAA 105 11/26/2017 0958   Lab Results  Component Value Date   HGBA1C 6.2 (H) 12/25/2017   Lab Results  Component Value Date   INSULIN 23.0 12/25/2017   CBC    Component Value Date/Time   WBC 8.4 11/26/2017 0958   RBC 4.37 11/26/2017 0958   HGB 12.7 11/26/2017 0958   HCT 38.0 11/26/2017 0958   PLT 304 11/26/2017 0958   MCV 87.0 11/26/2017 0958   MCH 29.1 11/26/2017 0958   MCHC 33.4 11/26/2017 0958   RDW 13.4 11/26/2017 0958   LYMPHSABS 1,848 11/26/2017 0958   MONOABS 480 02/07/2017 1142   EOSABS 143 11/26/2017 0958   BASOSABS 42 11/26/2017 0958   Iron/TIBC/Ferritin/ %Sat    Component Value Date/Time   IRON 64 07/03/2013 0935   TIBC 338 07/03/2013 0935   IRONPCTSAT 19 (L) 07/03/2013 0935   Lipid Panel     Component Value Date/Time   CHOL 171 12/25/2017 0923   TRIG 308 (H) 12/25/2017 0923   HDL 31 (L) 12/25/2017 0923   CHOLHDL 4.8 08/08/2017 0935   VLDL 34 (H) 02/07/2017 1142   LDLCALC 78 12/25/2017 0923   LDLCALC 97 08/08/2017 0935   Hepatic Function Panel     Component Value Date/Time   PROT 7.0 12/25/2017 0923   ALBUMIN 4.0 12/25/2017 0923   AST 12 12/25/2017  0923   ALT 14  12/25/2017 0923   ALKPHOS 55 12/25/2017 0923   BILITOT 0.3 12/25/2017 0923   BILIDIR 0.1 08/08/2017 0935   IBILI 0.4 08/08/2017 0935      Component Value Date/Time   TSH 1.71 11/26/2017 0958   TSH 2.42 02/07/2017 1142   TSH 1.44 02/02/2016 1129    ECG  shows NSR with a rate of 86 BPM INDIRECT CALORIMETER done today shows a VO2 of 348 and a REE of 2423.  Her calculated basal metabolic rate is 1884 thus her basal metabolic rate is better than expected.    ASSESSMENT AND PLAN: Other fatigue - Plan: Vitamin B12, Folate  Dyspnea on exertion  Vitamin D deficiency - Plan: VITAMIN D 25 Hydroxy (Vit-D Deficiency, Fractures)  Prediabetes - Plan: Comprehensive metabolic panel, Hemoglobin A1c, Insulin, random  Other hyperlipidemia - Plan: Lipid Panel With LDL/HDL Ratio  Depression screening  At risk for diabetes mellitus  Class 3 severe obesity with serious comorbidity and body mass index (BMI) of 45.0 to 49.9 in adult, unspecified obesity type ([place])  PLAN:  Fatigue Chany was informed that her fatigue may be related to obesity, depression or many other causes. Labs will be ordered, and in the meanwhile Joeann has agreed to work on diet, exercise and weight loss to help with fatigue. Proper sleep hygiene was discussed including the need for 7-8 hours of quality sleep each night. A sleep study was not ordered based on symptoms and Epworth score.  Dyspnea on exertion Neeley's shortness of breath appears to be obesity related and exercise induced. She has agreed to work on weight loss and gradually increase exercise to treat her exercise induced shortness of breath. If Djeneba follows our instructions and loses weight without improvement of her shortness of breath, we will plan to refer to pulmonology. We will monitor this condition regularly. Haileigh agrees to this plan.  Vitamin D Deficiency Ahilyn was informed that low vitamin D levels contributes to fatigue and are associated with  obesity, breast, and colon cancer. Shunta will follow up for routine testing of vitamin D, at least 2-3 times per year. She was informed of the risk of over-replacement of vitamin D and agrees to not increase her dose unless she discusses this with [place] first. We will check labs and Shary agrees to follow up with our clinic in 2 weeks.  Pre-Diabetes Annelyse is to start diet prescription and will continue to work on weight loss, exercise, and decreasing simple carbohydrates in her diet to help decrease the risk of diabetes. We dicussed metformin including benefits and risks. She was informed that eating too many simple carbohydrates or too many calories at one sitting increases the likelihood of GI side effects. Idonna declined metformin for now and a prescription was not written today. We will check labs and Cipriana agrees to follow up with our clinic in 2 weeks as directed to monitor her progress.  Diabetes risk counselling Pauletta was given extended (15 minutes) diabetes prevention counseling today. She is 52 y.o. female and has risk factors for diabetes including obesity and pre-diabetes. We discussed intensive lifestyle modifications today with an emphasis on weight loss as well as increasing exercise and decreasing simple carbohydrates in her diet.  Hyperlipidemia Makailee was informed of the American Heart Association Guidelines emphasizing intensive lifestyle modifications as the first line treatment for hyperlipidemia. We discussed many lifestyle modifications today in depth, and Abi will continue to work on decreasing saturated fats such as fatty red meat,  butter and many fried foods. She will also increase vegetables and lean protein in her diet and continue to work on exercise and weight loss efforts. We will check labs and Nakea agrees to follow up with our clinic in 2 weeks.  Depression Screen Terrance had a moderately positive depression screening. Depression is commonly associated with obesity  and often results in emotional eating behaviors. We will monitor this closely and work on CBT to help improve the non-hunger eating patterns. Referral to Psychology may be required if no improvement is seen as she continues in our clinic.  Obesity Ratasha is currently in the action stage of change and her goal is to continue with weight loss efforts. I recommend Artemisa begin the structured treatment plan as follows:  She has agreed to follow the Category 2 plan + 1/2 cup of cottage cheese Brandilynn has been instructed to eventually work up to a goal of 150 minutes of combined cardio and strengthening exercise per week for weight loss and overall health benefits. We discussed the following Behavioral Modification Strategies today: increasing lean protein intake, work on meal planning and easy cooking plans, and no skipping meals   She was informed of the importance of frequent follow up visits to maximize her success with intensive lifestyle modifications for her multiple health conditions. She was informed we would discuss her lab results at her next visit unless there is a critical issue that needs to be addressed sooner. Madolin agreed to keep her next visit at the agreed upon time to discuss these results.    OBESITY BEHAVIORAL INTERVENTION VISIT  Today's visit was # 1 out of 22.  Starting weight: 232 lbs Starting date: 12/25/17 Today's weight : 232 lbs Today's date: 12/25/2017 Total lbs lost to date: 0 (Patients must lose 7 lbs in the first 6 months to continue with counseling)   ASK: We discussed the diagnosis of obesity with Jodine Switalski today and Alexandrina agreed to give [place] permission to discuss obesity behavioral modification therapy today.  ASSESS: Neyda has the diagnosis of obesity and her BMI today is 48.5 Landis is in the action stage of change   ADVISE: Damica was educated on the multiple health risks of obesity as well as the benefit of weight loss to improve her health. She  was advised of the need for long term treatment and the importance of lifestyle modifications.  AGREE: Multiple dietary modification options and treatment options were discussed and  Yaileen agreed to the above obesity treatment plan.   I, Trixie Dredge, am acting as transcriptionist for Dennard Nip, MD  I have reviewed the above documentation for accuracy and completeness, and I agree with the above. -Dennard Nip, MD

## 2018-01-05 DIAGNOSIS — G4733 Obstructive sleep apnea (adult) (pediatric): Secondary | ICD-10-CM | POA: Diagnosis not present

## 2018-01-06 DIAGNOSIS — G4733 Obstructive sleep apnea (adult) (pediatric): Secondary | ICD-10-CM | POA: Diagnosis not present

## 2018-01-08 ENCOUNTER — Ambulatory Visit (INDEPENDENT_AMBULATORY_CARE_PROVIDER_SITE_OTHER): Payer: 59 | Admitting: Family Medicine

## 2018-01-08 VITALS — BP 110/71 | HR 75 | Temp 98.0°F | Ht <= 58 in | Wt 225.0 lb

## 2018-01-08 DIAGNOSIS — E7849 Other hyperlipidemia: Secondary | ICD-10-CM

## 2018-01-08 DIAGNOSIS — E559 Vitamin D deficiency, unspecified: Secondary | ICD-10-CM

## 2018-01-08 DIAGNOSIS — R7303 Prediabetes: Secondary | ICD-10-CM | POA: Diagnosis not present

## 2018-01-08 DIAGNOSIS — Z6841 Body Mass Index (BMI) 40.0 and over, adult: Secondary | ICD-10-CM

## 2018-01-08 DIAGNOSIS — Z9189 Other specified personal risk factors, not elsewhere classified: Secondary | ICD-10-CM | POA: Diagnosis not present

## 2018-01-08 MED ORDER — METFORMIN HCL 500 MG PO TABS
500.0000 mg | ORAL_TABLET | Freq: Every day | ORAL | 0 refills | Status: DC
Start: 2018-01-08 — End: 2018-02-04

## 2018-01-08 MED ORDER — VITAMIN D (ERGOCALCIFEROL) 1.25 MG (50000 UNIT) PO CAPS: 50000.0000 [IU] | ORAL_CAPSULE | ORAL | 0 refills | Status: DC

## 2018-01-08 MED FILL — VIT D2 1.25 MG (50,000 UNIT: 1.25 MG | 28 days supply | Qty: 4 | Fill #0

## 2018-01-08 MED FILL — metFORMIN HCL 500 MG TABS: 500 | 30 days supply | Qty: 30 | Fill #0

## 2018-01-09 NOTE — Progress Notes (Signed)
Office: [phone]  /  Fax: [phone]   HPI:   Chief Complaint: OBESITY Connie Hall is here to discuss her progress with her obesity treatment plan. She is on the Category 2 plan + 1/2 cup of cottage cheese and is following her eating plan approximately 75 % of the time. She states she is exercising 0 minutes 0 times per week. Katherin has done well with weight loss. She ate out 3 times but tried to make better choices. She struggled to not use oil while cooking and felt she missed a lot of foods. She added a lot of little extra calories. Her weight is 225 lb (102.1 kg) today and has had a weight loss of 7 pounds over a period of 2 weeks since her last visit. She has lost 7 lbs since starting treatment with [place].  Hyperlipidemia Edwin has hyperlipidemia and has been trying to improve her cholesterol levels with intensive lifestyle modification including a low saturated fat diet, exercise and weight loss. HDL is low, triglycerides were elevated. She is on Lipitor and she denies any chest pain, claudication or myalgias.  Vitamin D Deficiency Leeana has a diagnosis of vitamin D deficiency. She is on OTC Vit D, not yet at goal. She denies nausea, vomiting or muscle weakness.  Pre-Diabetes Nandita has a diagnosis of pre-diabetes based on her elevated Hgb A1c and was informed this puts her at greater risk of developing diabetes. She is not on metformin, fasting glucose and A1c are elevated, and she notes polyphagia. She continues to work on diet and exercise to decrease risk of diabetes. She denies nausea or hypoglycemia.  At risk for diabetes Therese is at higher than average risk for developing diabetes due to her obesity and pre-diabetes. She currently denies polyuria or polydipsia.  ALLERGIES: Allergies  Allergen Reactions  . Sulfonamide Derivatives Hives    MEDICATIONS: Current Outpatient Medications on File Prior to Visit  Medication Sig Dispense Refill  . ALLEGRA-D ALLERGY & CONGESTION  60-120 MG 12 hr tablet TAKE 1 TABLET BY MOUTH TWICE A DAY 180 tablet 3  . atorvastatin (LIPITOR) 10 MG tablet TAKE 1 TABLET BY MOUTH ONCE DAILY 90 tablet 3  . fluticasone (FLONASE) 50 MCG/ACT nasal spray Place 2 sprays into both nostrils daily. 16 g 1  . furosemide (LASIX) 40 MG tablet Take 1 tablet (40 mg total) by mouth daily. 30 tablet 3  . Multiple Vitamins-Minerals (MULTIVITAMIN WITH MINERALS) tablet Take 1 tablet by mouth daily.    [name] rOPINIRole (REQUIP) 0.5 MG tablet TAKE 1 TABLET BY MOUTH IN THE AFTERNOON AND 2 TABLETS BY MOUTH AT BEDTIME 270 tablet 11  . vitamin C (ASCORBIC ACID) 500 MG tablet Take 500 mg by mouth daily.    [name] zinc gluconate 50 MG tablet Take 50 mg by mouth daily.     No current facility-administered medications on file prior to visit.     PAST MEDICAL HISTORY: Past Medical History:  Diagnosis Date  . Back pain   . Deviated nasal septum   . Discoid lupus   . DM (diabetes mellitus) ([place])   . Family history of pancreatic cancer   . Family history of prostate cancer   . History of discoid lupus erythematosus   . Hyperlipidemia   . Leg edema   . Obesity   . OSA (obstructive sleep apnea)   . Prediabetes   . Restless legs syndrome (RLS)   . Vitamin D deficiency     PAST SURGICAL HISTORY: Past Surgical History:  Procedure Laterality Date  . ROTATOR CUFF REPAIR     left  . Sebaceous cyst removal, back  03/2008    SOCIAL HISTORY: Social History   Tobacco Use  . Smoking status: Former Smoker    Packs/day: 1.00    Years: 15.00    Pack years: 15.00    Types: Cigarettes    Last attempt to quit: 08/27/1998    Years since quitting: 19.3  . Smokeless tobacco: Never Used  Substance Use Topics  . Alcohol use: Yes    Comment: rare  . Drug use: No    FAMILY HISTORY: Family History  Problem Relation Age of Onset  . Hypertension Father   . Stroke Father   . Heart disease Father   . Diabetes Father   . Pancreatic cancer Father 25  . Prostate cancer  Father        dx in his 76s; prostatectomy  . Sleep apnea Father   . Obesity Father   . Skin cancer Mother   . Hyperlipidemia Mother   . Pancreatic cancer Sister 71       neuroendocrine tumor  . Colon polyps Sister   . Congestive Heart Failure Maternal Aunt   . Dementia Maternal Uncle   . Lung cancer Paternal Aunt   . Heart attack Paternal Uncle   . Colon polyps Sister   . Colon polyps Sister   . Pancreatic cancer Paternal Uncle 79  . Testicular cancer Cousin        paternal first cousin dx in his 69s    ROS: Review of Systems  Constitutional: Positive for weight loss.  Cardiovascular: Negative for chest pain and claudication.  Gastrointestinal: Negative for nausea and vomiting.  Genitourinary: Negative for frequency.  Musculoskeletal: Negative for myalgias.       Negative muscle weakness  Endo/Heme/Allergies: Negative for polydipsia.       Positive polyphagia Negative hypoglycemia    PHYSICAL EXAM: Blood pressure 110/71, pulse 75, temperature 98 F (36.7 C), temperature source Oral, height 4\' 10"  (1.473 m), weight 225 lb (102.1 kg), last menstrual period 01/04/2018, SpO2 95 %. Body mass index is 47.03 kg/m. Physical Exam  Constitutional: She is oriented to person, place, and time. She appears well-developed and well-nourished.  Cardiovascular: Normal rate.  Pulmonary/Chest: Effort normal.  Musculoskeletal: Normal range of motion.  Neurological: She is oriented to person, place, and time.  Skin: Skin is warm and dry.  Psychiatric: She has a normal mood and affect. Her behavior is normal.  Vitals reviewed.   RECENT LABS AND TESTS: BMET    Component Value Date/Time   NA 140 12/25/2017 0923   K 4.8 12/25/2017 0923   CL 102 12/25/2017 0923   CO2 21 12/25/2017 0923   GLUCOSE 112 (H) 12/25/2017 0923   GLUCOSE 125 12/20/2017 0937   BUN 14 12/25/2017 0923   CREATININE 0.82 12/25/2017 0923   CREATININE 0.83 12/20/2017 0937   CALCIUM 9.1 12/25/2017 0923   GFRNONAA  84 12/25/2017 0923   GFRNONAA 91 11/26/2017 0958   GFRAA 97 12/25/2017 0923   GFRAA 105 11/26/2017 0958   Lab Results  Component Value Date   HGBA1C 6.2 (H) 12/25/2017   HGBA1C 6.0 (H) 08/08/2017   HGBA1C 6.1 (H) 02/07/2017   HGBA1C 6.1 (H) 07/26/2016   HGBA1C 6.4 (H) 02/02/2016   Lab Results  Component Value Date   INSULIN 23.0 12/25/2017   CBC    Component Value Date/Time   WBC 8.4 11/26/2017 0958   RBC  4.37 11/26/2017 0958   HGB 12.7 11/26/2017 0958   HCT 38.0 11/26/2017 0958   PLT 304 11/26/2017 0958   MCV 87.0 11/26/2017 0958   MCH 29.1 11/26/2017 0958   MCHC 33.4 11/26/2017 0958   RDW 13.4 11/26/2017 0958   LYMPHSABS 1,848 11/26/2017 0958   MONOABS 480 02/07/2017 1142   EOSABS 143 11/26/2017 0958   BASOSABS 42 11/26/2017 0958   Iron/TIBC/Ferritin/ %Sat    Component Value Date/Time   IRON 64 07/03/2013 0935   TIBC 338 07/03/2013 0935   IRONPCTSAT 19 (L) 07/03/2013 0935   Lipid Panel     Component Value Date/Time   CHOL 171 12/25/2017 0923   TRIG 308 (H) 12/25/2017 0923   HDL 31 (L) 12/25/2017 0923   CHOLHDL 4.8 08/08/2017 0935   VLDL 34 (H) 02/07/2017 1142   LDLCALC 78 12/25/2017 0923   LDLCALC 97 08/08/2017 0935   Hepatic Function Panel     Component Value Date/Time   PROT 7.0 12/25/2017 0923   ALBUMIN 4.0 12/25/2017 0923   AST 12 12/25/2017 0923   ALT 14 12/25/2017 0923   ALKPHOS 55 12/25/2017 0923   BILITOT 0.3 12/25/2017 0923   BILIDIR 0.1 08/08/2017 0935   IBILI 0.4 08/08/2017 0935      Component Value Date/Time   TSH 1.71 11/26/2017 0958   TSH 2.42 02/07/2017 1142   TSH 1.44 02/02/2016 1129  Results for DAFINA, SUK (MRN [id number]) as of 01/09/2018 09:59  Ref. Range 12/25/2017 09:23  Vitamin D, 25-Hydroxy Latest Ref Range: 30.0 - 100.0 ng/mL 34.8    ASSESSMENT AND PLAN: Other hyperlipidemia  Vitamin D deficiency - Plan: Vitamin D, Ergocalciferol, (DRISDOL) 50000 units CAPS capsule  At risk for diabetes mellitus  Prediabetes -  Plan: metFORMIN (GLUCOPHAGE) 500 MG tablet  Class 3 severe obesity with serious comorbidity and body mass index (BMI) of 45.0 to 49.9 in adult, unspecified obesity type ([place])  PLAN:  Hyperlipidemia Keyoni was informed of the American Heart Association Guidelines emphasizing intensive lifestyle modifications as the first line treatment for hyperlipidemia. We discussed many lifestyle modifications today in depth, and Kyannah will continue to work on decreasing saturated fats such as fatty red meat, butter and many fried foods. She will also increase vegetables and lean protein in her diet and continue to work on diet, exercise and weight loss efforts. Harman agrees to continue taking Lipitor and we will recheck labs in 3 months. Arionne agrees to follow up with our clinic in 2 to 3 weeks.  Vitamin D Deficiency Zamaria was informed that low vitamin D levels contributes to fatigue and are associated with obesity, breast, and colon cancer. Sharalyn agrees to start prescription Vit D @50 ,000 IU every week #4 with no refills and hold OTC Vit D. She will follow up for routine testing of vitamin D, at least 2-3 times per year. She was informed of the risk of over-replacement of vitamin D and agrees to not increase her dose unless she discusses this with [place] first. Julienne agrees to follow up with our clinic in 2 to 3 weeks.  Pre-Diabetes Kalia will continue to work on weight loss, diet, exercise, and decreasing simple carbohydrates in her diet to help decrease the risk of diabetes. We dicussed metformin including benefits and risks. She was informed that eating too many simple carbohydrates or too many calories at one sitting increases the likelihood of GI side effects. Xaniyah agrees to start metformin 500 mg q AM #30 with no refills. Kaisyn agrees to  follow up with our clinic in 2 to 3 weeks as directed to monitor her progress.  Diabetes risk counselling Shanasia was given extended (30 minutes) diabetes prevention  counseling today. She is 52 y.o. female and has risk factors for diabetes including obesity and pre-diabetes. We discussed intensive lifestyle modifications today with an emphasis on weight loss as well as increasing exercise and decreasing simple carbohydrates in her diet.  Obesity Xitlally is currently in the action stage of change. As such, her goal is to continue with weight loss efforts She has agreed to follow the Category 2 plan + 100 calories Elexis has been instructed to work up to a goal of 150 minutes of combined cardio and strengthening exercise per week for weight loss and overall health benefits. We discussed the following Behavioral Modification Strategies today: increasing lean protein intake and decreasing simple carbohydrates    Maleka has agreed to follow up with our clinic in 2 to 3 weeks. She was informed of the importance of frequent follow up visits to maximize her success with intensive lifestyle modifications for her multiple health conditions.   OBESITY BEHAVIORAL INTERVENTION VISIT  Today's visit was # 2 out of 22.  Starting weight: 232 lbs Starting date: 12/25/17 Today's weight : 225 lbs  Today's date: 01/08/2018 Total lbs lost to date: 7 (Patients must lose 7 lbs in the first 6 months to continue with counseling)   ASK: We discussed the diagnosis of obesity with Madisen Hogeland today and Fatou agreed to give [place] permission to discuss obesity behavioral modification therapy today.  ASSESS: Elane has the diagnosis of obesity and her BMI today is 47.04 Tamela is in the action stage of change   ADVISE: Sianni was educated on the multiple health risks of obesity as well as the benefit of weight loss to improve her health. She was advised of the need for long term treatment and the importance of lifestyle modifications.  AGREE: Multiple dietary modification options and treatment options were discussed and  Jeilani agreed to the above obesity treatment plan.  I,  Trixie Dredge, am acting as transcriptionist for Dennard Nip, MD  I have reviewed the above documentation for accuracy and completeness, and I agree with the above. -Dennard Nip, MD

## 2018-01-24 ENCOUNTER — Other Ambulatory Visit: Payer: Self-pay

## 2018-01-24 DIAGNOSIS — G4733 Obstructive sleep apnea (adult) (pediatric): Secondary | ICD-10-CM

## 2018-01-24 DIAGNOSIS — G2581 Restless legs syndrome: Secondary | ICD-10-CM

## 2018-01-24 DIAGNOSIS — Z8639 Personal history of other endocrine, nutritional and metabolic disease: Secondary | ICD-10-CM

## 2018-01-24 DIAGNOSIS — Z0000 Encounter for general adult medical examination without abnormal findings: Secondary | ICD-10-CM

## 2018-01-24 DIAGNOSIS — Z800 Family history of malignant neoplasm of digestive organs: Secondary | ICD-10-CM

## 2018-01-24 DIAGNOSIS — R7302 Impaired glucose tolerance (oral): Secondary | ICD-10-CM

## 2018-01-24 DIAGNOSIS — Z1329 Encounter for screening for other suspected endocrine disorder: Secondary | ICD-10-CM

## 2018-01-24 DIAGNOSIS — E785 Hyperlipidemia, unspecified: Secondary | ICD-10-CM

## 2018-01-30 ENCOUNTER — Ambulatory Visit (INDEPENDENT_AMBULATORY_CARE_PROVIDER_SITE_OTHER): Payer: 59 | Admitting: Family Medicine

## 2018-01-30 VITALS — BP 113/71 | HR 71 | Temp 98.4°F | Ht <= 58 in | Wt 221.0 lb

## 2018-01-30 DIAGNOSIS — Z6841 Body Mass Index (BMI) 40.0 and over, adult: Secondary | ICD-10-CM

## 2018-01-30 DIAGNOSIS — Z9189 Other specified personal risk factors, not elsewhere classified: Secondary | ICD-10-CM | POA: Diagnosis not present

## 2018-01-30 DIAGNOSIS — E559 Vitamin D deficiency, unspecified: Secondary | ICD-10-CM

## 2018-01-30 DIAGNOSIS — R7303 Prediabetes: Secondary | ICD-10-CM | POA: Diagnosis not present

## 2018-02-04 ENCOUNTER — Other Ambulatory Visit (INDEPENDENT_AMBULATORY_CARE_PROVIDER_SITE_OTHER): Payer: Self-pay | Admitting: Family Medicine

## 2018-02-04 DIAGNOSIS — E559 Vitamin D deficiency, unspecified: Secondary | ICD-10-CM

## 2018-02-04 DIAGNOSIS — R7303 Prediabetes: Secondary | ICD-10-CM

## 2018-02-04 MED ORDER — VITAMIN D (ERGOCALCIFEROL) 1.25 MG (50000 UNIT) PO CAPS: 50000.0000 [IU] | ORAL_CAPSULE | ORAL | 0 refills | Status: DC

## 2018-02-04 MED ORDER — METFORMIN HCL 500 MG PO TABS: 500.0000 mg | ORAL_TABLET | Freq: Every day | ORAL | 0 refills | Status: DC

## 2018-02-04 MED FILL — metFORMIN HCL 500 MG TABS: 500 | 30 days supply | Qty: 30 | Fill #0

## 2018-02-04 MED FILL — VIT D2 1.25 MG (50,000 UNIT: 1.25 MG | 28 days supply | Qty: 4 | Fill #0

## 2018-02-10 NOTE — Progress Notes (Signed)
Office: [phone]  /  Fax: [phone]   HPI:   Chief Complaint: OBESITY Connie Hall is here to discuss her progress with her obesity treatment plan. She is on the Category 2 plan + 100 calories and is following her eating plan approximately 70-75 % of the time. She states she is walking 2 times per week. Connie Hall continues to do well with weight loss even while on vacations she made good choices. She is walking 2 times a week and plans to increase walking.  Her weight is 221 lb (100.2 kg) today and has had a weight loss of 4 pounds over a period of 3 weeks since her last visit. She has lost 11 lbs since starting treatment with [place].  Pre-Diabetes Connie Hall has a diagnosis of pre-diabetes based on her elevated Hgb A1c and was informed this puts her at greater risk of developing diabetes. She started metformin and denies nausea, vomiting, or hypoglycemia. She is doing well on diet and exercise to decrease risk of diabetes.   At risk for diabetes Connie Hall is at higher than average risk for developing diabetes due to her obesity and pre-diabetes. She currently denies polyuria or polydipsia.  Vitamin D Deficiency Connie Hall has a diagnosis of vitamin D deficiency. She is stable on prescription Vit D and denies nausea, vomiting or muscle weakness.  ALLERGIES: Allergies  Allergen Reactions  . Sulfonamide Derivatives Hives    MEDICATIONS: Current Outpatient Medications on File Prior to Visit  Medication Sig Dispense Refill  . ALLEGRA-D ALLERGY & CONGESTION 60-120 MG 12 hr tablet TAKE 1 TABLET BY MOUTH TWICE A DAY 180 tablet 3  . atorvastatin (LIPITOR) 10 MG tablet TAKE 1 TABLET BY MOUTH ONCE DAILY 90 tablet 3  . fluticasone (FLONASE) 50 MCG/ACT nasal spray Place 2 sprays into both nostrils daily. 16 g 1  . furosemide (LASIX) 40 MG tablet Take 1 tablet (40 mg total) by mouth daily. 30 tablet 3  . Multiple Vitamins-Minerals (MULTIVITAMIN WITH MINERALS) tablet Take 1 tablet by mouth daily.    [name]  rOPINIRole (REQUIP) 0.5 MG tablet TAKE 1 TABLET BY MOUTH IN THE AFTERNOON AND 2 TABLETS BY MOUTH AT BEDTIME 270 tablet 11  . vitamin C (ASCORBIC ACID) 500 MG tablet Take 500 mg by mouth daily.    [name] zinc gluconate 50 MG tablet Take 50 mg by mouth daily.     No current facility-administered medications on file prior to visit.     PAST MEDICAL HISTORY: Past Medical History:  Diagnosis Date  . Back pain   . Deviated nasal septum   . Discoid lupus   . DM (diabetes mellitus) ([place])   . Family history of pancreatic cancer   . Family history of prostate cancer   . History of discoid lupus erythematosus   . Hyperlipidemia   . Leg edema   . Obesity   . OSA (obstructive sleep apnea)   . Prediabetes   . Restless legs syndrome (RLS)   . Vitamin D deficiency     PAST SURGICAL HISTORY: Past Surgical History:  Procedure Laterality Date  . ROTATOR CUFF REPAIR     left  . Sebaceous cyst removal, back  03/2008    SOCIAL HISTORY: Social History   Tobacco Use  . Smoking status: Former Smoker    Packs/day: 1.00    Years: 15.00    Pack years: 15.00    Types: Cigarettes    Last attempt to quit: 08/27/1998    Years since quitting: 19.4  . Smokeless  tobacco: Never Used  Substance Use Topics  . Alcohol use: Yes    Comment: rare  . Drug use: No    FAMILY HISTORY: Family History  Problem Relation Age of Onset  . Hypertension Father   . Stroke Father   . Heart disease Father   . Diabetes Father   . Pancreatic cancer Father 62  . Prostate cancer Father        dx in his 36s; prostatectomy  . Sleep apnea Father   . Obesity Father   . Skin cancer Mother   . Hyperlipidemia Mother   . Pancreatic cancer Sister 50       neuroendocrine tumor  . Colon polyps Sister   . Congestive Heart Failure Maternal Aunt   . Dementia Maternal Uncle   . Lung cancer Paternal Aunt   . Heart attack Paternal Uncle   . Colon polyps Sister   . Colon polyps Sister   . Pancreatic cancer Paternal Uncle 59  .  Testicular cancer Cousin        paternal first cousin dx in his 48s    ROS: Review of Systems  Constitutional: Positive for weight loss.  Gastrointestinal: Negative for nausea and vomiting.  Genitourinary: Negative for frequency.  Musculoskeletal:       Negative muscle weakness  Endo/Heme/Allergies: Negative for polydipsia.       Negative hypoglycemia    PHYSICAL EXAM: Blood pressure 113/71, pulse 71, temperature 98.4 F (36.9 C), temperature source Oral, height 4\' 10"  (1.473 m), weight 221 lb (100.2 kg), last menstrual period 01/04/2018, SpO2 98 %. Body mass index is 46.19 kg/m. Physical Exam  Constitutional: She is oriented to person, place, and time. She appears well-developed and well-nourished.  Cardiovascular: Normal rate.  Pulmonary/Chest: Effort normal.  Musculoskeletal: Normal range of motion.  Neurological: She is oriented to person, place, and time.  Skin: Skin is warm and dry.  Psychiatric: She has a normal mood and affect. Her behavior is normal.  Vitals reviewed.   RECENT LABS AND TESTS: BMET    Component Value Date/Time   NA 140 12/25/2017 0923   K 4.8 12/25/2017 0923   CL 102 12/25/2017 0923   CO2 21 12/25/2017 0923   GLUCOSE 112 (H) 12/25/2017 0923   GLUCOSE 125 12/20/2017 0937   BUN 14 12/25/2017 0923   CREATININE 0.82 12/25/2017 0923   CREATININE 0.83 12/20/2017 0937   CALCIUM 9.1 12/25/2017 0923   GFRNONAA 84 12/25/2017 0923   GFRNONAA 91 11/26/2017 0958   GFRAA 97 12/25/2017 0923   GFRAA 105 11/26/2017 0958   Lab Results  Component Value Date   HGBA1C 6.2 (H) 12/25/2017   HGBA1C 6.0 (H) 08/08/2017   HGBA1C 6.1 (H) 02/07/2017   HGBA1C 6.1 (H) 07/26/2016   HGBA1C 6.4 (H) 02/02/2016   Lab Results  Component Value Date   INSULIN 23.0 12/25/2017   CBC    Component Value Date/Time   WBC 8.4 11/26/2017 0958   RBC 4.37 11/26/2017 0958   HGB 12.7 11/26/2017 0958   HCT 38.0 11/26/2017 0958   PLT 304 11/26/2017 0958   MCV 87.0 11/26/2017  0958   MCH 29.1 11/26/2017 0958   MCHC 33.4 11/26/2017 0958   RDW 13.4 11/26/2017 0958   LYMPHSABS 1,848 11/26/2017 0958   MONOABS 480 02/07/2017 1142   EOSABS 143 11/26/2017 0958   BASOSABS 42 11/26/2017 0958   Iron/TIBC/Ferritin/ %Sat    Component Value Date/Time   IRON 64 07/03/2013 0935   TIBC 338 07/03/2013  0935   IRONPCTSAT 19 (L) 07/03/2013 0935   Lipid Panel     Component Value Date/Time   CHOL 171 12/25/2017 0923   TRIG 308 (H) 12/25/2017 0923   HDL 31 (L) 12/25/2017 0923   CHOLHDL 4.8 08/08/2017 0935   VLDL 34 (H) 02/07/2017 1142   LDLCALC 78 12/25/2017 0923   LDLCALC 97 08/08/2017 0935   Hepatic Function Panel     Component Value Date/Time   PROT 7.0 12/25/2017 0923   ALBUMIN 4.0 12/25/2017 0923   AST 12 12/25/2017 0923   ALT 14 12/25/2017 0923   ALKPHOS 55 12/25/2017 0923   BILITOT 0.3 12/25/2017 0923   BILIDIR 0.1 08/08/2017 0935   IBILI 0.4 08/08/2017 0935      Component Value Date/Time   TSH 1.71 11/26/2017 0958   TSH 2.42 02/07/2017 1142   TSH 1.44 02/02/2016 1129  Results for Connie Hall, Connie Hall (MRN [id number]) as of 02/10/2018 09:33  Ref. Range 12/25/2017 09:23  Vitamin D, 25-Hydroxy Latest Ref Range: 30.0 - 100.0 ng/mL 34.8    ASSESSMENT AND PLAN: Prediabetes - Plan: metFORMIN (GLUCOPHAGE) 500 MG tablet  Vitamin D deficiency - Plan: Vitamin D, Ergocalciferol, (DRISDOL) 50000 units CAPS capsule  At risk for diabetes mellitus  Class 3 severe obesity with serious comorbidity and body mass index (BMI) of 45.0 to 49.9 in adult, unspecified obesity type ([place])  PLAN:  Pre-Diabetes Connie Hall will continue to work on weight loss, exercise, and decreasing simple carbohydrates in her diet to help decrease the risk of diabetes. We dicussed metformin including benefits and risks. She was informed that eating too many simple carbohydrates or too many calories at one sitting increases the likelihood of GI side effects. Amorita agrees to continue taking metformin  500 mg q AM #30 and we will refill for 1 month. Connie Hall agrees to follow up with our clinic in 2 to 3 weeks as directed to monitor her progress.  Diabetes risk counselling Avalina was given extended (15 minutes) diabetes prevention counseling today. She is 52 y.o. female and has risk factors for diabetes including obesity and pre-diabetes. We discussed intensive lifestyle modifications today with an emphasis on weight loss as well as increasing exercise and decreasing simple carbohydrates in her diet.  Vitamin D Deficiency Connie Hall was informed that low vitamin D levels contributes to fatigue and are associated with obesity, breast, and colon cancer. Connie Hall agrees to continue taking prescription Vit D @50 ,000 IU every week #4 and we will refill for 1 month. She will follow up for routine testing of vitamin D, at least 2-3 times per year. She was informed of the risk of over-replacement of vitamin D and agrees to not increase her dose unless she discusses this with [place] first.  Obesity Leita is currently in the action stage of change. As such, her goal is to continue with weight loss efforts She has agreed to follow the Category 2 plan Connie Hall has been instructed to work up to a goal of 150 minutes of combined cardio and strengthening exercise per week for weight loss and overall health benefits. We discussed the following Behavioral Modification Strategies today: increasing lean protein intake, decreasing simple carbohydrates, and no skipping meals    Connie Hall has agreed to follow up with our clinic in 2 to 3 weeks. She was informed of the importance of frequent follow up visits to maximize her success with intensive lifestyle modifications for her multiple health conditions.   OBESITY BEHAVIORAL INTERVENTION VISIT  Today's visit was # 3 out  of 22.  Starting weight: 232 lbs Starting date: 12/25/17 Today's weight : 221 lbs  Today's date: 01/30/2018 Total lbs lost to date: 11 (Patients must lose 7 lbs  in the first 6 months to continue with counseling)   ASK: We discussed the diagnosis of obesity with Kaho Gutzmer today and Marigny agreed to give [place] permission to discuss obesity behavioral modification therapy today.  ASSESS: Jonetta has the diagnosis of obesity and her BMI today is 46.2 Lakendra is in the action stage of change   ADVISE: Bonni was educated on the multiple health risks of obesity as well as the benefit of weight loss to improve her health. She was advised of the need for long term treatment and the importance of lifestyle modifications.  AGREE: Multiple dietary modification options and treatment options were discussed and  Elara agreed to the above obesity treatment plan.  I, Connie Hall, am acting as transcriptionist for Connie Nip, MD  I have reviewed the above documentation for accuracy and completeness, and I agree with the above. -Connie Nip, MD

## 2018-02-11 ENCOUNTER — Other Ambulatory Visit: Payer: 59 | Admitting: Internal Medicine

## 2018-02-13 ENCOUNTER — Encounter: Payer: 59 | Admitting: Internal Medicine

## 2018-02-18 ENCOUNTER — Ambulatory Visit (INDEPENDENT_AMBULATORY_CARE_PROVIDER_SITE_OTHER): Payer: 59 | Admitting: Family Medicine

## 2018-02-18 VITALS — BP 122/76 | HR 81 | Temp 98.5°F | Ht <= 58 in | Wt 217.0 lb

## 2018-02-18 DIAGNOSIS — E559 Vitamin D deficiency, unspecified: Secondary | ICD-10-CM | POA: Diagnosis not present

## 2018-02-18 DIAGNOSIS — Z9189 Other specified personal risk factors, not elsewhere classified: Secondary | ICD-10-CM | POA: Diagnosis not present

## 2018-02-18 DIAGNOSIS — Z6841 Body Mass Index (BMI) 40.0 and over, adult: Secondary | ICD-10-CM

## 2018-02-18 MED ORDER — VITAMIN D (ERGOCALCIFEROL) 1.25 MG (50000 UNIT) PO CAPS: 50000.0000 [IU] | ORAL_CAPSULE | ORAL | 0 refills | Status: DC

## 2018-02-18 NOTE — Progress Notes (Signed)
Office: [phone]  /  Fax: [phone]   HPI:   Chief Complaint: OBESITY Connie Hall is here to discuss her progress with her obesity treatment plan. Connie Hall is on the Category 2 plan and is following her eating plan approximately 70 % of the time. Connie Hall states Connie Hall is walking for 30 minutes 1 times per week. Connie Hall continues to do very well with weight loss on her Category 2 plan. Connie Hall still struggles with some meal planning and prepping but is doing better overall.  Her weight is 217 lb (98.4 kg) today and has had a weight loss of 4 pounds over a period of 2 to 3 weeks since her last visit. Connie Hall has lost 15 lbs since starting treatment with [place].  Vitamin D Deficiency Connie Hall has a diagnosis of vitamin D deficiency. Connie Hall is stable on prescription Vit D, not yet at goal. Connie Hall denies nausea, vomiting or muscle weakness.  At risk for osteopenia and osteoporosis Connie Hall is at higher risk of osteopenia and osteoporosis due to vitamin D deficiency.   ALLERGIES: Allergies  Allergen Reactions  . Sulfonamide Derivatives Hives    MEDICATIONS: Current Outpatient Medications on File Prior to Visit  Medication Sig Dispense Refill  . ALLEGRA-D ALLERGY & CONGESTION 60-120 MG 12 hr tablet TAKE 1 TABLET BY MOUTH TWICE A DAY 180 tablet 3  . atorvastatin (LIPITOR) 10 MG tablet TAKE 1 TABLET BY MOUTH ONCE DAILY 90 tablet 3  . fluticasone (FLONASE) 50 MCG/ACT nasal spray Place 2 sprays into both nostrils daily. 16 g 1  . furosemide (LASIX) 40 MG tablet Take 1 tablet (40 mg total) by mouth daily. 30 tablet 3  . metFORMIN (GLUCOPHAGE) 500 MG tablet Take 1 tablet (500 mg total) by mouth daily with breakfast. 30 tablet 0  . Multiple Vitamins-Minerals (MULTIVITAMIN WITH MINERALS) tablet Take 1 tablet by mouth daily.    Connie Hall Kitchen rOPINIRole (REQUIP) 0.5 MG tablet TAKE 1 TABLET BY MOUTH IN THE AFTERNOON AND 2 TABLETS BY MOUTH AT BEDTIME 270 tablet 11  . vitamin C (ASCORBIC ACID) 500 MG tablet Take 500 mg by mouth daily.    Connie Hall Kitchen  zinc gluconate 50 MG tablet Take 50 mg by mouth daily.     No current facility-administered medications on file prior to visit.     PAST MEDICAL HISTORY: Past Medical History:  Diagnosis Date  . Back pain   . Deviated nasal septum   . Discoid lupus   . DM (diabetes mellitus) ([place])   . Family history of pancreatic cancer   . Family history of prostate cancer   . History of discoid lupus erythematosus   . Hyperlipidemia   . Leg edema   . Obesity   . OSA (obstructive sleep apnea)   . Prediabetes   . Restless legs syndrome (RLS)   . Vitamin D deficiency     PAST SURGICAL HISTORY: Past Surgical History:  Procedure Laterality Date  . ROTATOR CUFF REPAIR     left  . Sebaceous cyst removal, back  03/2008    SOCIAL HISTORY: Social History   Tobacco Use  . Smoking status: Former Smoker    Packs/day: 1.00    Years: 15.00    Pack years: 15.00    Types: Cigarettes    Last attempt to quit: 08/27/1998    Years since quitting: 19.4  . Smokeless tobacco: Never Used  Substance Use Topics  . Alcohol use: Yes    Comment: rare  . Drug use: No    FAMILY HISTORY:  Family History  Problem Relation Age of Onset  . Hypertension Father   . Stroke Father   . Heart disease Father   . Diabetes Father   . Pancreatic cancer Father 58  . Prostate cancer Father        dx in his 14s; prostatectomy  . Sleep apnea Father   . Obesity Father   . Skin cancer Mother   . Hyperlipidemia Mother   . Pancreatic cancer Sister 33       neuroendocrine tumor  . Colon polyps Sister   . Congestive Heart Failure Maternal Aunt   . Dementia Maternal Uncle   . Lung cancer Paternal Aunt   . Heart attack Paternal Uncle   . Colon polyps Sister   . Colon polyps Sister   . Pancreatic cancer Paternal Uncle 77  . Testicular cancer Cousin        paternal first cousin dx in his 71s    ROS: Review of Systems  Constitutional: Positive for weight loss.  Gastrointestinal: Negative for nausea and vomiting.    Musculoskeletal:       Negative muscle weakness    PHYSICAL EXAM: Blood pressure 122/76, pulse 81, temperature 98.5 F (36.9 C), temperature source Oral, height 4\' 10"  (1.473 m), weight 217 lb (98.4 kg), SpO2 98 %. Body mass index is 45.35 kg/m. Physical Exam  Constitutional: Connie Hall is oriented to person, place, and time. Connie Hall appears well-developed and well-nourished.  Cardiovascular: Normal rate.  Pulmonary/Chest: Effort normal.  Musculoskeletal: Normal range of motion.  Neurological: Connie Hall is oriented to person, place, and time.  Skin: Skin is warm and dry.  Psychiatric: Connie Hall has a normal mood and affect. Her behavior is normal.  Vitals reviewed.   RECENT LABS AND TESTS: BMET    Component Value Date/Time   NA 140 12/25/2017 0923   K 4.8 12/25/2017 0923   CL 102 12/25/2017 0923   CO2 21 12/25/2017 0923   GLUCOSE 112 (H) 12/25/2017 0923   GLUCOSE 125 12/20/2017 0937   BUN 14 12/25/2017 0923   CREATININE 0.82 12/25/2017 0923   CREATININE 0.83 12/20/2017 0937   CALCIUM 9.1 12/25/2017 0923   GFRNONAA 84 12/25/2017 0923   GFRNONAA 91 11/26/2017 0958   GFRAA 97 12/25/2017 0923   GFRAA 105 11/26/2017 0958   Lab Results  Component Value Date   HGBA1C 6.2 (H) 12/25/2017   HGBA1C 6.0 (H) 08/08/2017   HGBA1C 6.1 (H) 02/07/2017   HGBA1C 6.1 (H) 07/26/2016   HGBA1C 6.4 (H) 02/02/2016   Lab Results  Component Value Date   INSULIN 23.0 12/25/2017   CBC    Component Value Date/Time   WBC 8.4 11/26/2017 0958   RBC 4.37 11/26/2017 0958   HGB 12.7 11/26/2017 0958   HCT 38.0 11/26/2017 0958   PLT 304 11/26/2017 0958   MCV 87.0 11/26/2017 0958   MCH 29.1 11/26/2017 0958   MCHC 33.4 11/26/2017 0958   RDW 13.4 11/26/2017 0958   LYMPHSABS 1,848 11/26/2017 0958   MONOABS 480 02/07/2017 1142   EOSABS 143 11/26/2017 0958   BASOSABS 42 11/26/2017 0958   Iron/TIBC/Ferritin/ %Sat    Component Value Date/Time   IRON 64 07/03/2013 0935   TIBC 338 07/03/2013 0935   IRONPCTSAT 19  (L) 07/03/2013 0935   Lipid Panel     Component Value Date/Time   CHOL 171 12/25/2017 0923   TRIG 308 (H) 12/25/2017 0923   HDL 31 (L) 12/25/2017 0923   CHOLHDL 4.8 08/08/2017 0935   VLDL  34 (H) 02/07/2017 1142   LDLCALC 78 12/25/2017 0923   LDLCALC 97 08/08/2017 0935   Hepatic Function Panel     Component Value Date/Time   PROT 7.0 12/25/2017 0923   ALBUMIN 4.0 12/25/2017 0923   AST 12 12/25/2017 0923   ALT 14 12/25/2017 0923   ALKPHOS 55 12/25/2017 0923   BILITOT 0.3 12/25/2017 0923   BILIDIR 0.1 08/08/2017 0935   IBILI 0.4 08/08/2017 0935      Component Value Date/Time   TSH 1.71 11/26/2017 0958   TSH 2.42 02/07/2017 1142   TSH 1.44 02/02/2016 1129  Results for GABBY, RACKERS (MRN [id number]) as of 02/18/2018 15:54  Ref. Range 12/25/2017 09:23  Vitamin D, 25-Hydroxy Latest Ref Range: 30.0 - 100.0 ng/mL 34.8    ASSESSMENT AND PLAN: Vitamin D deficiency - Plan: Vitamin D, Ergocalciferol, (DRISDOL) 50000 units CAPS capsule  At risk for osteoporosis  Class 3 severe obesity with serious comorbidity and body mass index (BMI) of 45.0 to 49.9 in adult, unspecified obesity type ([place])  PLAN:  Vitamin D Deficiency Connie Hall was informed that low vitamin D levels contributes to fatigue and are associated with obesity, breast, and colon cancer. Connie Hall agrees to continue taking prescription Vit D @50 ,000 IU every week #4 and we will refill for 1 month. Connie Hall will follow up for routine testing of vitamin D, at least 2-3 times per year. Connie Hall was informed of the risk of over-replacement of vitamin D and agrees to not increase her dose unless Connie Hall discusses this with [place] first. Connie Hall agrees to follow up with our clinic in 2 to 3 weeks.  At risk for osteopenia and osteoporosis Connie Hall is at risk for osteopenia and osteoporsis due to her vitamin D deficiency. Connie Hall was encouraged to take her vitamin D and follow her higher calcium diet and increase strengthening exercise to help strengthen her  bones and decrease her risk of osteopenia and osteoporosis.  Obesity Connie Hall is currently in the action stage of change. As such, her goal is to continue with weight loss efforts Connie Hall has agreed to follow the Category 2 plan Connie Hall. We discussed the following Behavioral Modification Strategies today: increasing lean protein intake, decreasing simple carbohydrates, dealing with family or coworker sabotage, holiday eating strategies, and celebration eating strategies   Jeris has agreed to follow up with our clinic in 2 to 3 weeks. Connie Hall was informed of the importance of frequent follow up visits to maximize her success with intensive lifestyle modifications for her multiple health conditions.   OBESITY BEHAVIORAL INTERVENTION VISIT  Today's visit was # 4 out of 22.  Starting weight: 232 lbs Starting date: 12/25/17 Today's weight : 217 lbs Today's date: 02/18/2018 Total lbs lost to date: 15 (Patients must lose 7 lbs in the first 6 months to continue with counseling)   ASK: We discussed the diagnosis of obesity with Shanyce Borbon today and Revecca agreed to give [place] permission to discuss obesity behavioral modification therapy today.  ASSESS: Zerina has the diagnosis of obesity and her BMI today is 45.37 Parys is in the action stage of change   ADVISE: Tyechia was educated on the multiple health risks of obesity as well as the benefit of weight loss to improve her health. Connie Hall was advised of the need for long term treatment and the importance of lifestyle modifications.  AGREE:  Multiple dietary modification options and treatment options were discussed and  Yanai agreed to the above obesity treatment plan.  I, Trixie Dredge, am acting as transcriptionist for Dennard Nip, MD  I have reviewed the above documentation for accuracy and  completeness, and I agree with the above. -Dennard Nip, MD

## 2018-03-06 MED FILL — VIT D2 1.25 MG (50,000 UNIT: 1.25 MG | 28 days supply | Qty: 4 | Fill #0

## 2018-03-10 ENCOUNTER — Other Ambulatory Visit: Payer: Self-pay | Admitting: Internal Medicine

## 2018-03-11 ENCOUNTER — Other Ambulatory Visit: Payer: 59 | Admitting: Internal Medicine

## 2018-03-11 ENCOUNTER — Ambulatory Visit (INDEPENDENT_AMBULATORY_CARE_PROVIDER_SITE_OTHER): Payer: 59 | Admitting: Physician Assistant

## 2018-03-11 VITALS — BP 102/68 | HR 86 | Temp 98.6°F | Ht <= 58 in | Wt 215.0 lb

## 2018-03-11 DIAGNOSIS — Z9189 Other specified personal risk factors, not elsewhere classified: Secondary | ICD-10-CM | POA: Diagnosis not present

## 2018-03-11 DIAGNOSIS — Z1329 Encounter for screening for other suspected endocrine disorder: Secondary | ICD-10-CM | POA: Diagnosis not present

## 2018-03-11 DIAGNOSIS — R7303 Prediabetes: Secondary | ICD-10-CM | POA: Diagnosis not present

## 2018-03-11 DIAGNOSIS — Z8639 Personal history of other endocrine, nutritional and metabolic disease: Secondary | ICD-10-CM

## 2018-03-11 DIAGNOSIS — E785 Hyperlipidemia, unspecified: Secondary | ICD-10-CM

## 2018-03-11 DIAGNOSIS — G4733 Obstructive sleep apnea (adult) (pediatric): Secondary | ICD-10-CM

## 2018-03-11 DIAGNOSIS — Z6841 Body Mass Index (BMI) 40.0 and over, adult: Secondary | ICD-10-CM | POA: Diagnosis not present

## 2018-03-11 DIAGNOSIS — G2581 Restless legs syndrome: Secondary | ICD-10-CM | POA: Diagnosis not present

## 2018-03-11 DIAGNOSIS — Z0000 Encounter for general adult medical examination without abnormal findings: Secondary | ICD-10-CM

## 2018-03-11 DIAGNOSIS — E559 Vitamin D deficiency, unspecified: Secondary | ICD-10-CM

## 2018-03-11 DIAGNOSIS — Z800 Family history of malignant neoplasm of digestive organs: Secondary | ICD-10-CM | POA: Diagnosis not present

## 2018-03-11 DIAGNOSIS — R7302 Impaired glucose tolerance (oral): Secondary | ICD-10-CM

## 2018-03-11 MED ORDER — METFORMIN HCL 500 MG PO TABS: 500.0000 mg | ORAL_TABLET | Freq: Every day | ORAL | 0 refills | Status: DC

## 2018-03-11 NOTE — Progress Notes (Signed)
Office: [phone]  /  Fax: [phone]   HPI:   Chief Complaint: OBESITY Connie Hall is here to discuss her progress with her obesity treatment plan. She is on the Category 2 plan and is following her eating plan approximately 50-60 % of the time. She states she is walking for 30 minutes 2 times per week. Connie Hall continues to do well with weight loss. She would like more convenient snack ideas.  Her weight is 215 lbs today and has had a weight loss of 2 pounds over a period of 3 weeks since her last visit. She has lost 17 lbs since starting treatment with [place].  Pre-Diabetes Connie Hall has a diagnosis of pre-diabetes based on her elevated Hgb A1c and was informed this puts her at greater risk of developing diabetes. She is taking metformin currently and continues to work on diet and exercise to decrease risk of diabetes. She denies polyphagia, nausea, or hypoglycemia.  At risk for diabetes Connie Hall is at higher than average risk for developing diabetes due to her obesity and pre-diabetes. She currently denies polyuria or polydipsia.  Vitamin D Deficiency Connie Hall has a diagnosis of vitamin D deficiency. She is currently taking prescription Vit D and denies nausea, vomiting or muscle weakness.  ALLERGIES: Allergies  Allergen Reactions  . Sulfonamide Derivatives Hives    MEDICATIONS: Current Outpatient Medications on File Prior to Visit  Medication Sig Dispense Refill  . ALLEGRA-D ALLERGY & CONGESTION 60-120 MG 12 hr tablet TAKE 1 TABLET BY MOUTH TWICE A DAY 180 tablet 3  . atorvastatin (LIPITOR) 10 MG tablet TAKE 1 TABLET BY MOUTH ONCE DAILY 90 tablet 3  . fluticasone (FLONASE) 50 MCG/ACT nasal spray Place 2 sprays into both nostrils daily. 16 g 1  . furosemide (LASIX) 40 MG tablet Take 1 tablet (40 mg total) by mouth daily. 30 tablet 3  . metFORMIN (GLUCOPHAGE) 500 MG tablet Take 1 tablet (500 mg total) by mouth daily with breakfast. 30 tablet 0  . Multiple Vitamins-Minerals (MULTIVITAMIN  WITH MINERALS) tablet Take 1 tablet by mouth daily.    [name] rOPINIRole (REQUIP) 0.5 MG tablet TAKE 1 TABLET BY MOUTH IN THE AFTERNOON AND 2 TABLETS BY MOUTH AT BEDTIME 270 tablet 11  . vitamin C (ASCORBIC ACID) 500 MG tablet Take 500 mg by mouth daily.    . Vitamin D, Ergocalciferol, (DRISDOL) 50000 units CAPS capsule Take 1 capsule (50,000 Units total) by mouth every 7 (seven) days. 4 capsule 0  . zinc gluconate 50 MG tablet Take 50 mg by mouth daily.     No current facility-administered medications on file prior to visit.     PAST MEDICAL HISTORY: Past Medical History:  Diagnosis Date  . Back pain   . Deviated nasal septum   . Discoid lupus   . DM (diabetes mellitus) ([place])   . Family history of pancreatic cancer   . Family history of prostate cancer   . History of discoid lupus erythematosus   . Hyperlipidemia   . Leg edema   . Obesity   . OSA (obstructive sleep apnea)   . Prediabetes   . Restless legs syndrome (RLS)   . Vitamin D deficiency     PAST SURGICAL HISTORY: Past Surgical History:  Procedure Laterality Date  . ROTATOR CUFF REPAIR     left  . Sebaceous cyst removal, back  03/2008    SOCIAL HISTORY: Social History   Tobacco Use  . Smoking status: Former Smoker    Packs/day: 1.00  Years: 15.00    Pack years: 15.00    Types: Cigarettes    Last attempt to quit: 08/27/1998    Years since quitting: 19.5  . Smokeless tobacco: Never Used  Substance Use Topics  . Alcohol use: Yes    Comment: rare  . Drug use: No    FAMILY HISTORY: Family History  Problem Relation Age of Onset  . Hypertension Father   . Stroke Father   . Heart disease Father   . Diabetes Father   . Pancreatic cancer Father 31  . Prostate cancer Father        dx in his 17s; prostatectomy  . Sleep apnea Father   . Obesity Father   . Skin cancer Mother   . Hyperlipidemia Mother   . Pancreatic cancer Sister 70       neuroendocrine tumor  . Colon polyps Sister   . Congestive Heart  Failure Maternal Aunt   . Dementia Maternal Uncle   . Lung cancer Paternal Aunt   . Heart attack Paternal Uncle   . Colon polyps Sister   . Colon polyps Sister   . Pancreatic cancer Paternal Uncle 91  . Testicular cancer Cousin        paternal first cousin dx in his 55s    ROS: Review of Systems  Constitutional: Positive for weight loss.  Gastrointestinal: Negative for nausea and vomiting.  Genitourinary: Negative for frequency.  Musculoskeletal:       Negative muscle weakness  Endo/Heme/Allergies: Negative for polydipsia.       Negative polyphagia Negative hypoglycemia    PHYSICAL EXAM: Blood pressure 102/68, pulse 86, temperature 98.6 F (37 C), temperature source Oral, height 4\' 10"  (1.473 m), SpO2 96 %. Body mass index is 45.35 kg/m. Physical Exam  Constitutional: She is oriented to person, place, and time. She appears well-developed and well-nourished.  Cardiovascular: Normal rate.  Pulmonary/Chest: Effort normal.  Musculoskeletal: Normal range of motion.  Neurological: She is oriented to person, place, and time.  Skin: Skin is warm and dry.  Psychiatric: She has a normal mood and affect. Her behavior is normal.  Vitals reviewed.   RECENT LABS AND TESTS: BMET    Component Value Date/Time   NA 140 12/25/2017 0923   K 4.8 12/25/2017 0923   CL 102 12/25/2017 0923   CO2 21 12/25/2017 0923   GLUCOSE 112 (H) 12/25/2017 0923   GLUCOSE 125 12/20/2017 0937   BUN 14 12/25/2017 0923   CREATININE 0.82 12/25/2017 0923   CREATININE 0.83 12/20/2017 0937   CALCIUM 9.1 12/25/2017 0923   GFRNONAA 84 12/25/2017 0923   GFRNONAA 91 11/26/2017 0958   GFRAA 97 12/25/2017 0923   GFRAA 105 11/26/2017 0958   Lab Results  Component Value Date   HGBA1C 6.2 (H) 12/25/2017   HGBA1C 6.0 (H) 08/08/2017   HGBA1C 6.1 (H) 02/07/2017   HGBA1C 6.1 (H) 07/26/2016   HGBA1C 6.4 (H) 02/02/2016   Lab Results  Component Value Date   INSULIN 23.0 12/25/2017   CBC    Component Value  Date/Time   WBC 8.2 03/11/2018 0930   RBC 4.68 03/11/2018 0930   HGB 13.4 03/11/2018 0930   HCT 40.2 03/11/2018 0930   PLT 298 03/11/2018 0930   MCV 85.9 03/11/2018 0930   MCH 28.6 03/11/2018 0930   MCHC 33.3 03/11/2018 0930   RDW 13.0 03/11/2018 0930   LYMPHSABS 1,714 03/11/2018 0930   MONOABS 480 02/07/2017 1142   EOSABS 131 03/11/2018 0930   BASOSABS  49 03/11/2018 0930   Iron/TIBC/Ferritin/ %Sat    Component Value Date/Time   IRON 64 07/03/2013 0935   TIBC 338 07/03/2013 0935   IRONPCTSAT 19 (L) 07/03/2013 0935   Lipid Panel     Component Value Date/Time   CHOL 171 12/25/2017 0923   TRIG 308 (H) 12/25/2017 0923   HDL 31 (L) 12/25/2017 0923   CHOLHDL 4.8 08/08/2017 0935   VLDL 34 (H) 02/07/2017 1142   LDLCALC 78 12/25/2017 0923   LDLCALC 97 08/08/2017 0935   Hepatic Function Panel     Component Value Date/Time   PROT 7.0 12/25/2017 0923   ALBUMIN 4.0 12/25/2017 0923   AST 12 12/25/2017 0923   ALT 14 12/25/2017 0923   ALKPHOS 55 12/25/2017 0923   BILITOT 0.3 12/25/2017 0923   BILIDIR 0.1 08/08/2017 0935   IBILI 0.4 08/08/2017 0935      Component Value Date/Time   TSH 1.71 11/26/2017 0958   TSH 2.42 02/07/2017 1142   TSH 1.44 02/02/2016 1129  Results for ADDISYN, LECLAIRE (MRN [id number]) as of 03/11/2018 17:32  Ref. Range 12/25/2017 09:23  Vitamin D, 25-Hydroxy Latest Ref Range: 30.0 - 100.0 ng/mL 34.8    ASSESSMENT AND PLAN: Prediabetes - Plan: metFORMIN (GLUCOPHAGE) 500 MG tablet  Vitamin D deficiency  At risk for diabetes mellitus  Class 3 severe obesity with serious comorbidity and body mass index (BMI) of 40.0 to 44.9 in adult, unspecified obesity type ([place])  PLAN:  Pre-Diabetes Connie Hall will continue to work on weight loss, exercise, and decreasing simple carbohydrates in her diet to help decrease the risk of diabetes. We dicussed metformin including benefits and risks. She was informed that eating too many simple carbohydrates or too many calories at  one sitting increases the likelihood of GI side effects. Connie Hall agrees to continue taking metformin 500 mg q AM #30 and we will refill for 1 month. Connie Hall agrees to follow up with our clinic in 2 weeks as directed to monitor her progress.  Diabetes risk counselling Connie Hall was given extended (15 minutes) diabetes prevention counseling today. She is 52 y.o. female and has risk factors for diabetes including obesity and pre-diabetes. We discussed intensive lifestyle modifications today with an emphasis on weight loss as well as increasing exercise and decreasing simple carbohydrates in her diet.  Vitamin D Deficiency Connie Hall was informed that low vitamin D levels contributes to fatigue and are associated with obesity, breast, and colon cancer. Connie Hall agrees to continue taking prescription Vit D @50 ,000 IU every week and will follow up for routine testing of vitamin D, at least 2-3 times per year. She was informed of the risk of over-replacement of vitamin D and agrees to not increase her dose unless she discusses this with [place] first. Connie Hall agrees to follow up with our clinic in 2 weeks.  Obesity Connie Hall is currently in the action stage of change. As such, her goal is to continue with weight loss efforts She has agreed to follow the Category 2 plan Connie Hall has been instructed to work up to a goal of 150 minutes of combined cardio and strengthening exercise per week for weight loss and overall health benefits. We discussed the following Behavioral Modification Strategies today: increasing lean protein intake and better snacking choices   Connie Hall has agreed to follow up with our clinic in 2 weeks. She was informed of the importance of frequent follow up visits to maximize her success with intensive lifestyle modifications for her multiple health conditions.   OBESITY BEHAVIORAL  INTERVENTION VISIT  Today's visit was # 5 out of 22.  Starting weight: 232 lbs Starting date: 12/25/17 Today's weight : 215 lbs   Today's date: 03/11/2018 Total lbs lost to date: 17 (Patients must lose 7 lbs in the first 6 months to continue with counseling)   ASK: We discussed the diagnosis of obesity with Connie Hall today and Connie Hall agreed to give [place] permission to discuss obesity behavioral modification therapy today.  ASSESS: Connie Hall has the diagnosis of obesity and her BMI today is 44.95 Connie Hall is in the action stage of change   ADVISE: Connie Hall was educated on the multiple health risks of obesity as well as the benefit of weight loss to improve her health. She was advised of the need for long term treatment and the importance of lifestyle modifications.  AGREE: Multiple dietary modification options and treatment options were discussed and  Connie Hall agreed to the above obesity treatment plan.   Wilhemena Durie, am acting as transcriptionist for Lacy Duverney, PA-C I, Lacy Duverney [facility], have reviewed this note and agree with its content

## 2018-03-12 LAB — COMPLETE METABOLIC PANEL WITH GFR
AG RATIO: 1.5 (calc) (ref 1.0–2.5)
ALT: 9 U/L (ref 6–29)
AST: 10 U/L (ref 10–35)
Albumin: 4.5 g/dL (ref 3.6–5.1)
Alkaline phosphatase (APISO): 45 U/L (ref 33–115)
BUN: 17 mg/dL (ref 7–25)
CALCIUM: 9.6 mg/dL (ref 8.6–10.2)
CO2: 25 mmol/L (ref 20–32)
CREATININE: 0.93 mg/dL (ref 0.50–1.10)
Chloride: 105 mmol/L (ref 98–110)
GFR, EST AFRICAN AMERICAN: 84 mL/min/{1.73_m2} (ref >=60)
GFR, EST NON AFRICAN AMERICAN: 72 mL/min/{1.73_m2} (ref >=60)
GLOBULIN: 3 g/dL (ref 1.9–3.7)
Glucose, Bld: 112 mg/dL — ABNORMAL HIGH (ref 65–99)
POTASSIUM: 4.9 mmol/L (ref 3.5–5.3)
SODIUM: 138 mmol/L (ref 135–146)
TOTAL PROTEIN: 7.5 g/dL (ref 6.1–8.1)
Total Bilirubin: 0.4 mg/dL (ref 0.2–1.2)

## 2018-03-12 LAB — CBC WITH DIFFERENTIAL/PLATELET
BASOS PCT: 0.6 %
Basophils Absolute: 49 cells/uL (ref 0–200)
EOS ABS: 131 {cells}/uL (ref 15–500)
EOS PCT: 1.6 %
HEMATOCRIT: 40.2 % (ref 35.0–45.0)
HEMOGLOBIN: 13.4 g/dL (ref 11.7–15.5)
Lymphs Abs: 1714 cells/uL (ref 850–3900)
MCH: 28.6 pg (ref 27.0–33.0)
MCHC: 33.3 g/dL (ref 32.0–36.0)
MCV: 85.9 fL (ref 80.0–100.0)
MONOS PCT: 4.9 %
MPV: 9.9 fL (ref 7.5–12.5)
NEUTROS ABS: 5904 {cells}/uL (ref 1500–7800)
Neutrophils Relative %: 72 %
Platelets: 298 10*3/uL (ref 140–400)
RBC: 4.68 10*6/uL (ref 3.80–5.10)
RDW: 13 % (ref 11.0–15.0)
TOTAL LYMPHOCYTE: 20.9 %
WBC mixed population: 402 cells/uL (ref 200–950)
WBC: 8.2 10*3/uL (ref 3.8–10.8)

## 2018-03-12 LAB — LIPID PANEL
CHOLESTEROL: 143 mg/dL (ref <200)
HDL: 32 mg/dL — AB (ref >50)
LDL Cholesterol (Calc): 88 mg/dL (calc)
Non-HDL Cholesterol (Calc): 111 mg/dL (calc) (ref <130)
Total CHOL/HDL Ratio: 4.5 (calc) (ref <5.0)
Triglycerides: 130 mg/dL (ref <150)

## 2018-03-12 LAB — MICROALBUMIN / CREATININE URINE RATIO
CREATININE, URINE: 43 mg/dL (ref 20–275)
Microalb Creat Ratio: 7 mcg/mg creat (ref <30)
Microalb, Ur: 0.3 mg/dL

## 2018-03-12 LAB — HEMOGLOBIN A1C
EAG (MMOL/L): 6.6 (calc)
Hgb A1c MFr Bld: 5.8 % of total Hgb — ABNORMAL HIGH (ref <5.7)
MEAN PLASMA GLUCOSE: 120 (calc)

## 2018-03-12 LAB — VITAMIN D 25 HYDROXY (VIT D DEFICIENCY, FRACTURES): Vit D, 25-Hydroxy: 49 ng/mL (ref 30–100)

## 2018-03-12 LAB — TSH: TSH: 1.68 m[IU]/L

## 2018-03-12 MED FILL — metFORMIN HCL 500 MG TABS: 500 | 30 days supply | Qty: 30 | Fill #0

## 2018-03-13 ENCOUNTER — Ambulatory Visit (INDEPENDENT_AMBULATORY_CARE_PROVIDER_SITE_OTHER): Payer: 59 | Admitting: Internal Medicine

## 2018-03-13 ENCOUNTER — Encounter: Payer: Self-pay | Admitting: Internal Medicine

## 2018-03-13 VITALS — BP 110/70 | HR 72 | Ht <= 58 in | Wt 224.0 lb

## 2018-03-13 DIAGNOSIS — R609 Edema, unspecified: Secondary | ICD-10-CM | POA: Diagnosis not present

## 2018-03-13 DIAGNOSIS — R7302 Impaired glucose tolerance (oral): Secondary | ICD-10-CM | POA: Diagnosis not present

## 2018-03-13 DIAGNOSIS — G2581 Restless legs syndrome: Secondary | ICD-10-CM

## 2018-03-13 DIAGNOSIS — Z6841 Body Mass Index (BMI) 40.0 and over, adult: Secondary | ICD-10-CM | POA: Diagnosis not present

## 2018-03-13 DIAGNOSIS — Z0000 Encounter for general adult medical examination without abnormal findings: Secondary | ICD-10-CM | POA: Diagnosis not present

## 2018-03-13 DIAGNOSIS — G4733 Obstructive sleep apnea (adult) (pediatric): Secondary | ICD-10-CM

## 2018-03-13 DIAGNOSIS — E785 Hyperlipidemia, unspecified: Secondary | ICD-10-CM

## 2018-03-13 LAB — POCT URINALYSIS DIPSTICK
APPEARANCE: NORMAL
Bilirubin, UA: NEGATIVE
Glucose, UA: NEGATIVE
Ketones, UA: NEGATIVE
LEUKOCYTES UA: NEGATIVE
NITRITE UA: NEGATIVE
ODOR: NORMAL
PH UA: 6 (ref 5.0–8.0)
PROTEIN UA: NEGATIVE
Spec Grav, UA: 1.01 (ref 1.010–1.025)
UROBILINOGEN UA: 0.2 U/dL

## 2018-03-13 NOTE — Progress Notes (Signed)
Subjective:    Patient ID: Connie Hall, female    DOB: 14-Jun-1966, 52 y.o.   MRN: [id number]  HPI 52 year old Female for health maintenance exam and evaluation of medical issues.  Improvement in Hgb AIC. Now seeing Dr. Leafy Ro and is on metformin now. Hx impaired glucose tolerance.  Mother living with her now for about 3 months. Usually lives in [place].  In 2017 was seen by a cornea specialist from [facility] in [place] regarding retinal holes.  History of plantar fasciitis, hyperlipidemia, obesity, restless leg syndrome, controlled type 2 diabetes without complication, obstructive sleep apnea, dependent edema, deviated nasal septum, history of vitamin D deficiency and metabolic syndrome.  She is allergic to sulfa causes hives  Some pain medications cause nausea.  Had tetanus immunization update given in 2012 in the emergency department due to laceration right great toe when she stepped on a piece of tin.  History of deviated nasal septum seen by ENT physician but has not had it repaired.  Past medical history: Left shoulder arthropathy 2009, right medial epicondylitis August 2009.  Right carpal tunnel syndrome September 2009.  Patient had sebaceous cyst removed from back by Dr. Zella Richer August 2009.  History of obstructive sleep apnea and is seeing Dr. clients.  Has sleep apnea device.  Social history: Single, never married.  Does not smoke.  Seldom consumes alcohol.  Has a college degree.  Resides alone.  Family lives in [place].  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  Family history: Father with history of hypertension, stroke, coronary artery disease, kidney stones and diabetes.  Mother with history of osteoporosis.  No brothers.  4 sisters, 2 of them have diabetes.  2 sisters have sleep apnea and 3 sisters are overweight.  One sister in good health.          Review of Systems  Constitutional: Negative.   All  other systems reviewed and are negative.      Objective:   Physical Exam  Constitutional: She is oriented to person, place, and time. She appears well-developed and well-nourished. No distress.  HENT:  Head: Normocephalic and atraumatic.  Right Ear: External ear normal.  Left Ear: External ear normal.  Mouth/Throat: Oropharynx is clear and moist.  Eyes: Pupils are equal, round, and reactive to light. Conjunctivae and EOM are normal. Right eye exhibits no discharge. Left eye exhibits no discharge. No scleral icterus.  Neck: Neck supple. No JVD present.  Cardiovascular: Normal rate, regular rhythm and normal heart sounds.  No murmur heard. Pulmonary/Chest: No stridor. No respiratory distress. She has no wheezes. She has no rales.  Abdominal: Soft. Bowel sounds are normal. She exhibits no distension and no mass. There is no tenderness. There is no rebound and no guarding. No hernia.  Genitourinary:  Genitourinary Comments: Bimanual normal.  Pap taken 2017  Musculoskeletal: She exhibits no edema.  Lymphadenopathy:    She has no cervical adenopathy.  Neurological: She is oriented to person, place, and time. She displays normal reflexes. No cranial nerve deficit or sensory deficit. She exhibits normal muscle tone. Coordination normal.  Skin: Skin is warm and dry. No rash noted. She is not diaphoretic.  Psychiatric: She has a normal mood and affect. Her behavior is normal. Thought content normal.  Vitals reviewed.         Assessment & Plan:  Normal health maintenance exam  BMI 46.82-has been going to see Dr. Leafy Ro for weight management  Impaired glucose tolerance-hemoglobin A1c 6.1%.  Is on metformin 500 mg daily  Hyperlipidemia-continue lipid-lowering medication.  Triglycerides are elevated at 168, has low HDL at 33 but total cholesterol and LDL cholesterol are normal.  Restless leg syndrome continue Requip  Dependent edema continue Lasix.  Return in 6 months or as  needed.

## 2018-03-26 DIAGNOSIS — M25511 Pain in right shoulder: Secondary | ICD-10-CM | POA: Diagnosis not present

## 2018-03-26 NOTE — Patient Instructions (Signed)
It was a pleasure to see you today.  We are glad you are seeing Dr. Leafy Ro.  Continue same medications and follow-up in 6 months.  It is noted you are now on metformin per Dr. Leafy Ro.

## 2018-03-27 ENCOUNTER — Ambulatory Visit (INDEPENDENT_AMBULATORY_CARE_PROVIDER_SITE_OTHER): Payer: 59 | Admitting: Family Medicine

## 2018-03-27 VITALS — BP 107/62 | HR 81 | Temp 98.1°F | Ht <= 58 in | Wt 217.0 lb

## 2018-03-27 DIAGNOSIS — B078 Other viral warts: Secondary | ICD-10-CM | POA: Diagnosis not present

## 2018-03-27 DIAGNOSIS — R7303 Prediabetes: Secondary | ICD-10-CM | POA: Diagnosis not present

## 2018-03-27 DIAGNOSIS — L821 Other seborrheic keratosis: Secondary | ICD-10-CM | POA: Diagnosis not present

## 2018-03-27 DIAGNOSIS — Z6841 Body Mass Index (BMI) 40.0 and over, adult: Secondary | ICD-10-CM | POA: Diagnosis not present

## 2018-03-27 DIAGNOSIS — L820 Inflamed seborrheic keratosis: Secondary | ICD-10-CM | POA: Diagnosis not present

## 2018-03-31 NOTE — Progress Notes (Signed)
Office: [phone]  /  Fax: [phone]   HPI:   Chief Complaint: OBESITY Connie Hall is here to discuss her progress with her obesity treatment plan. She is on the Category 2 plan and is following her eating plan approximately 25 % of the time. She states she is walking 30 to 45 minutes 3 times per week. Connie Hall has gotten off track over her birthday with one week of celebration eating, but she is ready to get back on track, Connie Hall struggles the most with eating breakfast. Her weight is 217 lb (98.4 kg) today and has had a weight gain of 2 pounds over a period of 2 weeks since her last visit. She has lost 15 lbs since starting treatment with [place].  Pre-Diabetes Connie Hall has a diagnosis of prediabetes based on her elevated Hgb A1c and was informed this puts her at greater risk of developing diabetes. Connie Hall had increased sugar for her birthday week, but she is ready to get back on track. She is taking metformin currently and continues to work on diet and exercise to decrease risk of diabetes. She denies nausea or hypoglycemia.  ALLERGIES: Allergies  Allergen Reactions  . Sulfonamide Derivatives Hives    MEDICATIONS: Current Outpatient Medications on File Prior to Visit  Medication Sig Dispense Refill  . ALLEGRA-D ALLERGY & CONGESTION 60-120 MG 12 hr tablet TAKE 1 TABLET BY MOUTH TWICE A DAY 180 tablet 3  . atorvastatin (LIPITOR) 10 MG tablet TAKE 1 TABLET BY MOUTH ONCE DAILY 90 tablet 3  . fluticasone (FLONASE) 50 MCG/ACT nasal spray Place 2 sprays into both nostrils daily. 16 g 1  . furosemide (LASIX) 40 MG tablet Take 1 tablet (40 mg total) by mouth daily. 30 tablet 3  . metFORMIN (GLUCOPHAGE) 500 MG tablet Take 1 tablet (500 mg total) by mouth daily with breakfast. 30 tablet 0  . Multiple Vitamins-Minerals (MULTIVITAMIN WITH MINERALS) tablet Take 1 tablet by mouth daily.    Connie Hall Kitchen rOPINIRole (REQUIP) 0.5 MG tablet TAKE 1 TABLET BY MOUTH IN THE AFTERNOON AND 2 TABLETS BY MOUTH AT BEDTIME 270  tablet 11  . vitamin C (ASCORBIC ACID) 500 MG tablet Take 500 mg by mouth daily.    . Vitamin D, Ergocalciferol, (DRISDOL) 50000 units CAPS capsule Take 1 capsule (50,000 Units total) by mouth every 7 (seven) days. 4 capsule 0  . zinc gluconate 50 MG tablet Take 50 mg by mouth daily.     No current facility-administered medications on file prior to visit.     PAST MEDICAL HISTORY: Past Medical History:  Diagnosis Date  . Back pain   . Deviated nasal septum   . Discoid lupus   . DM (diabetes mellitus) ([place])   . Family history of pancreatic cancer   . Family history of prostate cancer   . History of discoid lupus erythematosus   . Hyperlipidemia   . Leg edema   . Obesity   . OSA (obstructive sleep apnea)   . Prediabetes   . Restless legs syndrome (RLS)   . Vitamin D deficiency     PAST SURGICAL HISTORY: Past Surgical History:  Procedure Laterality Date  . ROTATOR CUFF REPAIR     left  . Sebaceous cyst removal, back  03/2008    SOCIAL HISTORY: Social History   Tobacco Use  . Smoking status: Former Smoker    Packs/day: 1.00    Years: 15.00    Pack years: 15.00    Types: Cigarettes    Last attempt to  quit: 08/27/1998    Years since quitting: 19.6  . Smokeless tobacco: Never Used  Substance Use Topics  . Alcohol use: Yes    Comment: rare  . Drug use: No    FAMILY HISTORY: Family History  Problem Relation Age of Onset  . Hypertension Father   . Stroke Father   . Heart disease Father   . Diabetes Father   . Pancreatic cancer Father 72  . Prostate cancer Father        dx in his 56s; prostatectomy  . Sleep apnea Father   . Obesity Father   . Skin cancer Mother   . Hyperlipidemia Mother   . Pancreatic cancer Sister 69       neuroendocrine tumor  . Colon polyps Sister   . Congestive Heart Failure Maternal Aunt   . Dementia Maternal Uncle   . Lung cancer Paternal Aunt   . Heart attack Paternal Uncle   . Colon polyps Sister   . Colon polyps Sister   .  Pancreatic cancer Paternal Uncle 90  . Testicular cancer Cousin        paternal first cousin dx in his 41s    ROS: Review of Systems  Constitutional: Negative for weight loss.  Gastrointestinal: Negative for nausea.  Endo/Heme/Allergies:       Negative for hypoglycemia    PHYSICAL EXAM: Blood pressure 107/62, pulse 81, temperature 98.1 F (36.7 C), temperature source Oral, height 4\' 10"  (1.473 m), weight 217 lb (98.4 kg), last menstrual period 03/07/2018, SpO2 97 %. Body mass index is 45.35 kg/m. Physical Exam  Constitutional: She is oriented to person, place, and time. She appears well-developed and well-nourished.  Cardiovascular: Normal rate.  Pulmonary/Chest: Effort normal.  Musculoskeletal: Normal range of motion.  Neurological: She is oriented to person, place, and time.  Skin: Skin is warm and dry.  Psychiatric: She has a normal mood and affect.  Vitals reviewed.   RECENT LABS AND TESTS: BMET    Component Value Date/Time   NA 138 03/11/2018 0930   NA 140 12/25/2017 0923   K 4.9 03/11/2018 0930   CL 105 03/11/2018 0930   CO2 25 03/11/2018 0930   GLUCOSE 112 (H) 03/11/2018 0930   BUN 17 03/11/2018 0930   BUN 14 12/25/2017 0923   CREATININE 0.93 03/11/2018 0930   CALCIUM 9.6 03/11/2018 0930   GFRNONAA 72 03/11/2018 0930   GFRAA 84 03/11/2018 0930   Lab Results  Component Value Date   HGBA1C 5.8 (H) 03/11/2018   HGBA1C 6.2 (H) 12/25/2017   HGBA1C 6.0 (H) 08/08/2017   HGBA1C 6.1 (H) 02/07/2017   HGBA1C 6.1 (H) 07/26/2016   Lab Results  Component Value Date   INSULIN 23.0 12/25/2017   CBC    Component Value Date/Time   WBC 8.2 03/11/2018 0930   RBC 4.68 03/11/2018 0930   HGB 13.4 03/11/2018 0930   HCT 40.2 03/11/2018 0930   PLT 298 03/11/2018 0930   MCV 85.9 03/11/2018 0930   MCH 28.6 03/11/2018 0930   MCHC 33.3 03/11/2018 0930   RDW 13.0 03/11/2018 0930   LYMPHSABS 1,714 03/11/2018 0930   MONOABS 480 02/07/2017 1142   EOSABS 131 03/11/2018  0930   BASOSABS 49 03/11/2018 0930   Iron/TIBC/Ferritin/ %Sat    Component Value Date/Time   IRON 64 07/03/2013 0935   TIBC 338 07/03/2013 0935   IRONPCTSAT 19 (L) 07/03/2013 0935   Lipid Panel     Component Value Date/Time   CHOL 143 03/11/2018  0930   CHOL 171 12/25/2017 0923   TRIG 130 03/11/2018 0930   HDL 32 (L) 03/11/2018 0930   HDL 31 (L) 12/25/2017 0923   CHOLHDL 4.5 03/11/2018 0930   VLDL 34 (H) 02/07/2017 1142   LDLCALC 88 03/11/2018 0930   Hepatic Function Panel     Component Value Date/Time   PROT 7.5 03/11/2018 0930   PROT 7.0 12/25/2017 0923   ALBUMIN 4.0 12/25/2017 0923   AST 10 03/11/2018 0930   ALT 9 03/11/2018 0930   ALKPHOS 55 12/25/2017 0923   BILITOT 0.4 03/11/2018 0930   BILITOT 0.3 12/25/2017 0923   BILIDIR 0.1 08/08/2017 0935   IBILI 0.4 08/08/2017 0935      Component Value Date/Time   TSH 1.68 03/11/2018 0930   TSH 1.71 11/26/2017 0958   TSH 2.42 02/07/2017 1142   Results for JAMEY, DEMCHAK (MRN [id number]) as of 03/31/2018 10:48  Ref. Range 03/11/2018 09:30  Vitamin D, 25-Hydroxy Latest Ref Range: 30 - 100 ng/mL 49   ASSESSMENT AND PLAN: Prediabetes  Class 3 severe obesity with serious comorbidity and body mass index (BMI) of 45.0 to 49.9 in adult, unspecified obesity type ([place])  PLAN:  Pre-Diabetes Connie Hall will continue to work on weight loss, exercise, and decreasing simple carbohydrates in her diet to help decrease the risk of diabetes. We dicussed metformin including benefits and risks. She was informed that eating too many simple carbohydrates or too many calories at one sitting increases the likelihood of GI side effects. Connie Hall will continue metformin and get back to diet prescription. Connie Hall agreed to follow up with [place] as directed to monitor her progress.  We spent > than 50% of the 15 minute visit on the counseling as documented in the note.  Obesity Tikita is currently in the action stage of change. As such, her goal is to  continue with weight loss efforts She has agreed to keep a food journal with 250 to 350 calories and 20 grams of protein at breakfast daily and follow the Category 2 plan Connie Hall has been instructed to work up to a goal of 150 minutes of combined cardio and strengthening exercise per week for weight loss and overall health benefits. We discussed the following Behavioral Modification Strategies today: increasing lean protein intake, decreasing simple carbohydrates and work on meal planning and easy cooking plans  Connie Hall has agreed to follow up with our clinic in 2 weeks. She was informed of the importance of frequent follow up visits to maximize her success with intensive lifestyle modifications for her multiple health conditions.   OBESITY BEHAVIORAL INTERVENTION VISIT  Today's visit was # 6 out of 22.  Starting weight: 232 lbs Starting date: 12/25/17 Today's weight : 217 lbs  Today's date: 03/27/2018 Total lbs lost to date: 15    ASK: We discussed the diagnosis of obesity with Connie Hall today and Connie Hall agreed to give [place] permission to discuss obesity behavioral modification therapy today.  ASSESS: Connie Hall has the diagnosis of obesity and her BMI today is 45.37 Connie Hall is in the action stage of change   ADVISE: Connie Hall was educated on the multiple health risks of obesity as well as the benefit of weight loss to improve her health. She was advised of the need for long term treatment and the importance of lifestyle modifications.  AGREE: Multiple dietary modification options and treatment options were discussed and  Connie Hall agreed to the above obesity treatment plan.  Corey Skains, am acting as Location manager for [employer]  Leafy Ro, MD  I have reviewed the above documentation for accuracy and completeness, and I agree with the above. -Dennard Nip, MD

## 2018-04-07 DIAGNOSIS — G4733 Obstructive sleep apnea (adult) (pediatric): Secondary | ICD-10-CM | POA: Diagnosis not present

## 2018-04-09 ENCOUNTER — Ambulatory Visit (INDEPENDENT_AMBULATORY_CARE_PROVIDER_SITE_OTHER): Payer: 59 | Admitting: Physician Assistant

## 2018-04-09 VITALS — BP 106/70 | HR 74 | Temp 98.1°F | Ht <= 58 in | Wt 213.0 lb

## 2018-04-09 DIAGNOSIS — E559 Vitamin D deficiency, unspecified: Secondary | ICD-10-CM | POA: Diagnosis not present

## 2018-04-09 DIAGNOSIS — Z9189 Other specified personal risk factors, not elsewhere classified: Secondary | ICD-10-CM

## 2018-04-09 DIAGNOSIS — Z6841 Body Mass Index (BMI) 40.0 and over, adult: Secondary | ICD-10-CM | POA: Diagnosis not present

## 2018-04-09 DIAGNOSIS — R7303 Prediabetes: Secondary | ICD-10-CM

## 2018-04-09 MED ORDER — METFORMIN HCL 500 MG PO TABS: 500.0000 mg | ORAL_TABLET | Freq: Every day | ORAL | 0 refills | Status: DC

## 2018-04-09 MED ORDER — VITAMIN D (ERGOCALCIFEROL) 1.25 MG (50000 UNIT) PO CAPS: 50000.0000 [IU] | ORAL_CAPSULE | ORAL | 0 refills | Status: DC

## 2018-04-09 MED FILL — VIT D2 1.25 MG (50,000 UNIT: 1.25 MG | 28 days supply | Qty: 4 | Fill #0

## 2018-04-09 MED FILL — metFORMIN HCL 500 MG TABS: 500 | 30 days supply | Qty: 30 | Fill #0

## 2018-04-09 NOTE — Progress Notes (Signed)
Office: [phone]  /  Fax: [phone]   HPI:   Chief Complaint: OBESITY Connie Hall is here to discuss her progress with her obesity treatment plan. She is on the keep a food journal with 250-350 calories and 20 grams of protein at breakfast daily and follow the Category 2 plan and is following her eating plan approximately 65 % of the time. She states she is walking for 60 minutes 3 times per week. [name] did very well with weight loss. She reports that she is enjoying journaling and would like to continue it, with the freedom it allows. She has been on vacation and made smart choices.  Her weight is 213 lb (96.6 kg) today and has had a weight loss of 4 pounds over a period of 2 weeks since her last visit. She has lost 19 lbs since starting treatment with [place].  Vitamin D Deficiency Connie Hall has a diagnosis of vitamin D deficiency. She is on prescription Vit D and denies nausea, vomiting or muscle weakness.  At risk for osteopenia and osteoporosis Connie Hall is at higher risk of osteopenia and osteoporosis due to vitamin D deficiency.   Pre-Diabetes Connie Hall has a diagnosis of pre-diabetes based on her elevated Hgb A1c and was informed this puts her at greater risk of developing diabetes. She denies polyphagia or hypoglycemia. She denies nausea, vomiting, or diarrhea on metformin and continues to work on diet and exercise to decrease risk of diabetes.   ALLERGIES: Allergies  Allergen Reactions  . Sulfonamide Derivatives Hives    MEDICATIONS: Current Outpatient Medications on File Prior to Visit  Medication Sig Dispense Refill  . ALLEGRA-D ALLERGY & CONGESTION 60-120 MG 12 hr tablet TAKE 1 TABLET BY MOUTH TWICE A DAY 180 tablet 3  . atorvastatin (LIPITOR) 10 MG tablet TAKE 1 TABLET BY MOUTH ONCE DAILY 90 tablet 3  . fluticasone (FLONASE) 50 MCG/ACT nasal spray Place 2 sprays into both nostrils daily. 16 g 1  . furosemide (LASIX) 40 MG tablet Take 1 tablet (40 mg total) by mouth daily. 30  tablet 3  . metFORMIN (GLUCOPHAGE) 500 MG tablet Take 1 tablet (500 mg total) by mouth daily with breakfast. 30 tablet 0  . Multiple Vitamins-Minerals (MULTIVITAMIN WITH MINERALS) tablet Take 1 tablet by mouth daily.    Connie Hall Kitchen rOPINIRole (REQUIP) 0.5 MG tablet TAKE 1 TABLET BY MOUTH IN THE AFTERNOON AND 2 TABLETS BY MOUTH AT BEDTIME 270 tablet 11  . vitamin C (ASCORBIC ACID) 500 MG tablet Take 500 mg by mouth daily.    . Vitamin D, Ergocalciferol, (DRISDOL) 50000 units CAPS capsule Take 1 capsule (50,000 Units total) by mouth every 7 (seven) days. 4 capsule 0  . zinc gluconate 50 MG tablet Take 50 mg by mouth daily.     No current facility-administered medications on file prior to visit.     PAST MEDICAL HISTORY: Past Medical History:  Diagnosis Date  . Back pain   . Deviated nasal septum   . Discoid lupus   . DM (diabetes mellitus) ([place])   . Family history of pancreatic cancer   . Family history of prostate cancer   . History of discoid lupus erythematosus   . Hyperlipidemia   . Leg edema   . Obesity   . OSA (obstructive sleep apnea)   . Prediabetes   . Restless legs syndrome (RLS)   . Vitamin D deficiency     PAST SURGICAL HISTORY: Past Surgical History:  Procedure Laterality Date  . ROTATOR CUFF REPAIR  left  . Sebaceous cyst removal, back  03/2008    SOCIAL HISTORY: Social History   Tobacco Use  . Smoking status: Former Smoker    Packs/day: 1.00    Years: 15.00    Pack years: 15.00    Types: Cigarettes    Last attempt to quit: 08/27/1998    Years since quitting: 19.6  . Smokeless tobacco: Never Used  Substance Use Topics  . Alcohol use: Yes    Comment: rare  . Drug use: No    FAMILY HISTORY: Family History  Problem Relation Age of Onset  . Hypertension Father   . Stroke Father   . Heart disease Father   . Diabetes Father   . Pancreatic cancer Father 27  . Prostate cancer Father        dx in his 35s; prostatectomy  . Sleep apnea Father   . Obesity  Father   . Skin cancer Mother   . Hyperlipidemia Mother   . Pancreatic cancer Sister 41       neuroendocrine tumor  . Colon polyps Sister   . Congestive Heart Failure Maternal Aunt   . Dementia Maternal Uncle   . Lung cancer Paternal Aunt   . Heart attack Paternal Uncle   . Colon polyps Sister   . Colon polyps Sister   . Pancreatic cancer Paternal Uncle 48  . Testicular cancer Cousin        paternal first cousin dx in his 69s    ROS: Review of Systems  Constitutional: Positive for weight loss.  Gastrointestinal: Negative for diarrhea, nausea and vomiting.  Musculoskeletal:       Negative muscle weakness  Endo/Heme/Allergies:       Negative polyphagia Negative hypoglycemia    PHYSICAL EXAM: Blood pressure 106/70, pulse 74, temperature 98.1 F (36.7 C), temperature source Oral, height 4\' 10"  (1.473 m), weight 213 lb (96.6 kg), last menstrual period 04/09/2018, SpO2 96 %. Body mass index is 44.52 kg/m. Physical Exam  Constitutional: She is oriented to person, place, and time. She appears well-developed and well-nourished.  Cardiovascular: Normal rate.  Pulmonary/Chest: Effort normal.  Musculoskeletal: Normal range of motion.  Neurological: She is oriented to person, place, and time.  Skin: Skin is warm and dry.  Psychiatric: She has a normal mood and affect. Her behavior is normal.  Vitals reviewed.   RECENT LABS AND TESTS: BMET    Component Value Date/Time   NA 138 03/11/2018 0930   NA 140 12/25/2017 0923   K 4.9 03/11/2018 0930   CL 105 03/11/2018 0930   CO2 25 03/11/2018 0930   GLUCOSE 112 (H) 03/11/2018 0930   BUN 17 03/11/2018 0930   BUN 14 12/25/2017 0923   CREATININE 0.93 03/11/2018 0930   CALCIUM 9.6 03/11/2018 0930   GFRNONAA 72 03/11/2018 0930   GFRAA 84 03/11/2018 0930   Lab Results  Component Value Date   HGBA1C 5.8 (H) 03/11/2018   HGBA1C 6.2 (H) 12/25/2017   HGBA1C 6.0 (H) 08/08/2017   HGBA1C 6.1 (H) 02/07/2017   HGBA1C 6.1 (H) 07/26/2016     Lab Results  Component Value Date   INSULIN 23.0 12/25/2017   CBC    Component Value Date/Time   WBC 8.2 03/11/2018 0930   RBC 4.68 03/11/2018 0930   HGB 13.4 03/11/2018 0930   HCT 40.2 03/11/2018 0930   PLT 298 03/11/2018 0930   MCV 85.9 03/11/2018 0930   MCH 28.6 03/11/2018 0930   MCHC 33.3 03/11/2018 0930  RDW 13.0 03/11/2018 0930   LYMPHSABS 1,714 03/11/2018 0930   MONOABS 480 02/07/2017 1142   EOSABS 131 03/11/2018 0930   BASOSABS 49 03/11/2018 0930   Iron/TIBC/Ferritin/ %Sat    Component Value Date/Time   IRON 64 07/03/2013 0935   TIBC 338 07/03/2013 0935   IRONPCTSAT 19 (L) 07/03/2013 0935   Lipid Panel     Component Value Date/Time   CHOL 143 03/11/2018 0930   CHOL 171 12/25/2017 0923   TRIG 130 03/11/2018 0930   HDL 32 (L) 03/11/2018 0930   HDL 31 (L) 12/25/2017 0923   CHOLHDL 4.5 03/11/2018 0930   VLDL 34 (H) 02/07/2017 1142   LDLCALC 88 03/11/2018 0930   Hepatic Function Panel     Component Value Date/Time   PROT 7.5 03/11/2018 0930   PROT 7.0 12/25/2017 0923   ALBUMIN 4.0 12/25/2017 0923   AST 10 03/11/2018 0930   ALT 9 03/11/2018 0930   ALKPHOS 55 12/25/2017 0923   BILITOT 0.4 03/11/2018 0930   BILITOT 0.3 12/25/2017 0923   BILIDIR 0.1 08/08/2017 0935   IBILI 0.4 08/08/2017 0935      Component Value Date/Time   TSH 1.68 03/11/2018 0930   TSH 1.71 11/26/2017 0958   TSH 2.42 02/07/2017 1142  Results for DESIRE, FULP (MRN [id number]) as of 04/09/2018 16:54  Ref. Range 03/11/2018 09:30  Vitamin D, 25-Hydroxy Latest Ref Range: 30 - 100 ng/mL 49    ASSESSMENT AND PLAN: Vitamin D deficiency - Plan: Vitamin D, Ergocalciferol, (DRISDOL) 50000 units CAPS capsule  Prediabetes - Plan: metFORMIN (GLUCOPHAGE) 500 MG tablet  At risk for osteoporosis  Class 3 severe obesity with serious comorbidity and body mass index (BMI) of 40.0 to 44.9 in adult, unspecified obesity type ([place])  PLAN:  Vitamin D Deficiency Jenisis was informed that low  vitamin D levels contributes to fatigue and are associated with obesity, breast, and colon cancer. Lexani agrees to continue taking prescription Vit D @50 ,000 IU every week #4 and we will refill for 1 month. She will follow up for routine testing of vitamin D, at least 2-3 times per year. She was informed of the risk of over-replacement of vitamin D and agrees to not increase her dose unless she discusses this with [place] first. Symphany agrees to follow up with our clinic in 2 weeks.  At risk for osteopenia and osteoporosis Rhianna is at risk for osteopenia and osteoporsis due to her vitamin D deficiency. She was encouraged to take her vitamin D and follow her higher calcium diet and increase strengthening exercise to help strengthen her bones and decrease her risk of osteopenia and osteoporosis.  Pre-Diabetes Ileanna will continue to work on weight loss, exercise, and decreasing simple carbohydrates in her diet to help decrease the risk of diabetes. We dicussed metformin including benefits and risks. She was informed that eating too many simple carbohydrates or too many calories at one sitting increases the likelihood of GI side effects. Jentri agrees to continue taking metformin 500 mg q AM #30 and we will refill for 1 month. Adalyn agrees to follow up with our clinic in 2 weeks as directed to monitor her progress.  Obesity Miles is currently in the action stage of change. As such, her goal is to continue with weight loss efforts She has agreed to keep a food journal with 1100-1200 calories and 80 grams of protein daily Keyonta has been instructed to work up to a goal of 150 minutes of combined cardio and strengthening exercise  per week for weight loss and overall health benefits. We discussed the following Behavioral Modification Strategies today: work on meal planning and easy cooking plans and planning for success   Rima has agreed to follow up with our clinic in 2 weeks. She was informed of the  importance of frequent follow up visits to maximize her success with intensive lifestyle modifications for her multiple health conditions.   OBESITY BEHAVIORAL INTERVENTION VISIT  Today's visit was # 7 out of 22.  Starting weight: 232 lbs Starting date: 12/25/17 Today's weight : 213 lbs Today's date: 04/09/2018 Total lbs lost to date: 30    ASK: We discussed the diagnosis of obesity with Dorisann Grismore today and Sarabeth agreed to give [place] permission to discuss obesity behavioral modification therapy today.  ASSESS: Kloie has the diagnosis of obesity and her BMI today is 44.53 Niemah is in the action stage of change   ADVISE: Deundra was educated on the multiple health risks of obesity as well as the benefit of weight loss to improve her health. She was advised of the need for long term treatment and the importance of lifestyle modifications.  AGREE: Multiple dietary modification options and treatment options were discussed and  Armetta agreed to the above obesity treatment plan.  Wilhemena Durie, am acting as transcriptionist for Abby Potash, PA-C I, Abby Potash, PA-C have reviewed above note and agree with its content

## 2018-04-10 MED FILL — FUROSEMIDE 40 MG TAB: 40 | 20 days supply | Qty: 20 | Fill #2

## 2018-04-10 MED FILL — ATORVASTATIN 10 MG TABLET: 10 | 90 days supply | Qty: 90 | Fill #2

## 2018-04-10 MED FILL — rOPINIRole HCL 0.5 MG TABS: 0.5 | 90 days supply | Qty: 270 | Fill #2

## 2018-04-24 ENCOUNTER — Encounter (INDEPENDENT_AMBULATORY_CARE_PROVIDER_SITE_OTHER): Payer: Self-pay | Admitting: Physician Assistant

## 2018-04-24 ENCOUNTER — Ambulatory Visit (INDEPENDENT_AMBULATORY_CARE_PROVIDER_SITE_OTHER): Payer: 59 | Admitting: Physician Assistant

## 2018-04-24 VITALS — BP 110/65 | HR 80 | Temp 98.2°F | Ht <= 58 in | Wt 212.0 lb

## 2018-04-24 DIAGNOSIS — Z6841 Body Mass Index (BMI) 40.0 and over, adult: Secondary | ICD-10-CM | POA: Diagnosis not present

## 2018-04-24 DIAGNOSIS — R7303 Prediabetes: Secondary | ICD-10-CM

## 2018-04-24 DIAGNOSIS — Z9189 Other specified personal risk factors, not elsewhere classified: Secondary | ICD-10-CM

## 2018-04-24 DIAGNOSIS — E559 Vitamin D deficiency, unspecified: Secondary | ICD-10-CM | POA: Diagnosis not present

## 2018-04-24 MED ORDER — VITAMIN D (ERGOCALCIFEROL) 1.25 MG (50000 UNIT) PO CAPS: 50000.0000 [IU] | ORAL_CAPSULE | ORAL | 0 refills | Status: DC

## 2018-04-24 MED ORDER — METFORMIN HCL 500 MG PO TABS
500.0000 mg | ORAL_TABLET | Freq: Every day | ORAL | 0 refills | Status: DC
Start: 2018-04-24 — End: 2018-05-15

## 2018-04-25 NOTE — Progress Notes (Signed)
Office: [phone]  /  Fax: [phone]   HPI:   Chief Complaint: OBESITY Connie Hall is here to discuss her progress with her obesity treatment plan. She is on the keep a food journal with 1100-1200 calories and 80 grams of protein daily and is following her eating plan approximately 60 % of the time. She states she is walking for 30 minutes 4 times per week. Connie Hall did well with weight loss. She reports getting off track most of the time but she is ready to follow the plan closely.  Her weight is 212 lb (96.2 kg) today and has had a weight loss of 1 pounds over a period of 2 weeks since her last visit. She has lost 20 lbs since starting treatment with [place].  Vitamin D Deficiency Connie Hall has a diagnosis of vitamin D deficiency. She is on prescription Vit D and denies nausea, vomiting or muscle weakness.  At risk for osteopenia and osteoporosis Connie Hall is at higher risk of osteopenia and osteoporosis due to vitamin D deficiency.   Pre-Diabetes Connie Hall has a diagnosis of pre-diabetes based on her elevated Hgb A1c and was informed this puts her at greater risk of developing diabetes. She denies nausea, vomiting, or diarrhea on metformin and she continues to work on diet and exercise to decrease risk of diabetes. She denies polyphagia or hypoglycemia.  ALLERGIES: Allergies  Allergen Reactions  . Sulfonamide Derivatives Hives    MEDICATIONS: Current Outpatient Medications on File Prior to Visit  Medication Sig Dispense Refill  . ALLEGRA-D ALLERGY & CONGESTION 60-120 MG 12 hr tablet TAKE 1 TABLET BY MOUTH TWICE A DAY 180 tablet 3  . atorvastatin (LIPITOR) 10 MG tablet TAKE 1 TABLET BY MOUTH ONCE DAILY 90 tablet 3  . fluticasone (FLONASE) 50 MCG/ACT nasal spray Place 2 sprays into both nostrils daily. 16 g 1  . furosemide (LASIX) 40 MG tablet Take 1 tablet (40 mg total) by mouth daily. 30 tablet 3  . Multiple Vitamins-Minerals (MULTIVITAMIN WITH MINERALS) tablet Take 1 tablet by mouth daily.     [name] rOPINIRole (REQUIP) 0.5 MG tablet TAKE 1 TABLET BY MOUTH IN THE AFTERNOON AND 2 TABLETS BY MOUTH AT BEDTIME 270 tablet 11  . vitamin C (ASCORBIC ACID) 500 MG tablet Take 500 mg by mouth daily.    [name] zinc gluconate 50 MG tablet Take 50 mg by mouth daily.     No current facility-administered medications on file prior to visit.     PAST MEDICAL HISTORY: Past Medical History:  Diagnosis Date  . Back pain   . Deviated nasal septum   . Discoid lupus   . DM (diabetes mellitus) ([place])   . Family history of pancreatic cancer   . Family history of prostate cancer   . History of discoid lupus erythematosus   . Hyperlipidemia   . Leg edema   . Obesity   . OSA (obstructive sleep apnea)   . Prediabetes   . Restless legs syndrome (RLS)   . Vitamin D deficiency     PAST SURGICAL HISTORY: Past Surgical History:  Procedure Laterality Date  . ROTATOR CUFF REPAIR     left  . Sebaceous cyst removal, back  03/2008    SOCIAL HISTORY: Social History   Tobacco Use  . Smoking status: Former Smoker    Packs/day: 1.00    Years: 15.00    Pack years: 15.00    Types: Cigarettes    Last attempt to quit: 08/27/1998    Years since quitting:  19.6  . Smokeless tobacco: Never Used  Substance Use Topics  . Alcohol use: Yes    Comment: rare  . Drug use: No    FAMILY HISTORY: Family History  Problem Relation Age of Onset  . Hypertension Father   . Stroke Father   . Heart disease Father   . Diabetes Father   . Pancreatic cancer Father 70  . Prostate cancer Father        dx in his 31s; prostatectomy  . Sleep apnea Father   . Obesity Father   . Skin cancer Mother   . Hyperlipidemia Mother   . Pancreatic cancer Sister 86       neuroendocrine tumor  . Colon polyps Sister   . Congestive Heart Failure Maternal Aunt   . Dementia Maternal Uncle   . Lung cancer Paternal Aunt   . Heart attack Paternal Uncle   . Colon polyps Sister   . Colon polyps Sister   . Pancreatic cancer Paternal Uncle 4   . Testicular cancer Cousin        paternal first cousin dx in his 14s    ROS: Review of Systems  Constitutional: Positive for weight loss.  Gastrointestinal: Negative for diarrhea, nausea and vomiting.  Musculoskeletal:       Negative muscle weakness  Endo/Heme/Allergies:       Negative polyphagia Negative hypoglycemia    PHYSICAL EXAM: Blood pressure 110/65, pulse 80, temperature 98.2 F (36.8 C), temperature source Oral, height 4\' 10"  (1.473 m), weight 212 lb (96.2 kg), last menstrual period 04/09/2018, SpO2 97 %. Body mass index is 44.31 kg/m. Physical Exam  Constitutional: She is oriented to person, place, and time. She appears well-developed and well-nourished.  Cardiovascular: Normal rate.  Pulmonary/Chest: Effort normal.  Musculoskeletal: Normal range of motion.  Neurological: She is oriented to person, place, and time.  Skin: Skin is warm and dry.  Psychiatric: She has a normal mood and affect. Her behavior is normal.  Vitals reviewed.   RECENT LABS AND TESTS: BMET    Component Value Date/Time   NA 138 03/11/2018 0930   NA 140 12/25/2017 0923   K 4.9 03/11/2018 0930   CL 105 03/11/2018 0930   CO2 25 03/11/2018 0930   GLUCOSE 112 (H) 03/11/2018 0930   BUN 17 03/11/2018 0930   BUN 14 12/25/2017 0923   CREATININE 0.93 03/11/2018 0930   CALCIUM 9.6 03/11/2018 0930   GFRNONAA 72 03/11/2018 0930   GFRAA 84 03/11/2018 0930   Lab Results  Component Value Date   HGBA1C 5.8 (H) 03/11/2018   HGBA1C 6.2 (H) 12/25/2017   HGBA1C 6.0 (H) 08/08/2017   HGBA1C 6.1 (H) 02/07/2017   HGBA1C 6.1 (H) 07/26/2016   Lab Results  Component Value Date   INSULIN 23.0 12/25/2017   CBC    Component Value Date/Time   WBC 8.2 03/11/2018 0930   RBC 4.68 03/11/2018 0930   HGB 13.4 03/11/2018 0930   HCT 40.2 03/11/2018 0930   PLT 298 03/11/2018 0930   MCV 85.9 03/11/2018 0930   MCH 28.6 03/11/2018 0930   MCHC 33.3 03/11/2018 0930   RDW 13.0 03/11/2018 0930   LYMPHSABS  1,714 03/11/2018 0930   MONOABS 480 02/07/2017 1142   EOSABS 131 03/11/2018 0930   BASOSABS 49 03/11/2018 0930   Iron/TIBC/Ferritin/ %Sat    Component Value Date/Time   IRON 64 07/03/2013 0935   TIBC 338 07/03/2013 0935   IRONPCTSAT 19 (L) 07/03/2013 0935   Lipid Panel  Component Value Date/Time   CHOL 143 03/11/2018 0930   CHOL 171 12/25/2017 0923   TRIG 130 03/11/2018 0930   HDL 32 (L) 03/11/2018 0930   HDL 31 (L) 12/25/2017 0923   CHOLHDL 4.5 03/11/2018 0930   VLDL 34 (H) 02/07/2017 1142   LDLCALC 88 03/11/2018 0930   Hepatic Function Panel     Component Value Date/Time   PROT 7.5 03/11/2018 0930   PROT 7.0 12/25/2017 0923   ALBUMIN 4.0 12/25/2017 0923   AST 10 03/11/2018 0930   ALT 9 03/11/2018 0930   ALKPHOS 55 12/25/2017 0923   BILITOT 0.4 03/11/2018 0930   BILITOT 0.3 12/25/2017 0923   BILIDIR 0.1 08/08/2017 0935   IBILI 0.4 08/08/2017 0935      Component Value Date/Time   TSH 1.68 03/11/2018 0930   TSH 1.71 11/26/2017 0958   TSH 2.42 02/07/2017 1142  Results for SAMI, ROES (MRN [id number]) as of 04/25/2018 13:15  Ref. Range 03/11/2018 09:30  Vitamin D, 25-Hydroxy Latest Ref Range: 30 - 100 ng/mL 49    ASSESSMENT AND PLAN: Prediabetes - Plan: metFORMIN (GLUCOPHAGE) 500 MG tablet  Vitamin D deficiency - Plan: Vitamin D, Ergocalciferol, (DRISDOL) 50000 units CAPS capsule  At risk for osteoporosis  Class 3 severe obesity with serious comorbidity and body mass index (BMI) of 40.0 to 44.9 in adult, unspecified obesity type ([place])  PLAN:  Vitamin D Deficiency Connie Hall was informed that low vitamin D levels contributes to fatigue and are associated with obesity, breast, and colon cancer. Connie Hall agrees to continue taking prescription Vit D @50 ,000 IU every week #4 and we will refill for 1 month. She will follow up for routine testing of vitamin D, at least 2-3 times per year. She was informed of the risk of over-replacement of vitamin D and agrees to not  increase her dose unless she discusses this with [place] first. Connie Hall agrees to follow up with our clinic in 2 to 3 weeks.  At risk for osteopenia and osteoporosis Connie Hall is at risk for osteopenia and osteoporsis due to her vitamin D deficiency. She was encouraged to take her vitamin D and follow her higher calcium diet and increase strengthening exercise to help strengthen her bones and decrease her risk of osteopenia and osteoporosis.  Pre-Diabetes Connie Hall will continue to work on weight loss, exercise, and decreasing simple carbohydrates in her diet to help decrease the risk of diabetes. We dicussed metformin including benefits and risks. She was informed that eating too many simple carbohydrates or too many calories at one sitting increases the likelihood of GI side effects. Connie Hall agrees to continue taking metformin 500 mg q AM #30 and we will refill for 1 month. Connie Hall agrees to follow up with our clinic in 2 to 3 weeks as directed to monitor her progress.  Obesity Connie Hall is currently in the action stage of change. As such, her goal is to continue with weight loss efforts She has agreed to keep a food journal with 1100-1200 calories and 80 grams of protein daily Connie Hall has been instructed to work up to a goal of 150 minutes of combined cardio and strengthening exercise per week for weight loss and overall health benefits. We discussed the following Behavioral Modification Strategies today: work on meal planning and easy cooking plans and ways to avoid boredom eating   Braylea has agreed to follow up with our clinic in 2 to 3 weeks. She was informed of the importance of frequent follow up visits to maximize her  success with intensive lifestyle modifications for her multiple health conditions.   OBESITY BEHAVIORAL INTERVENTION VISIT  Today's visit was # 8.  Starting weight: 232 lbs Starting date: 12/25/17 Today's weight : 212 lbs  Today's date: 04/24/2018 Total lbs lost to date:  44    ASK: We discussed the diagnosis of obesity with Connie Hall today and Connie Hall agreed to give [place] permission to discuss obesity behavioral modification therapy today.  ASSESS: Connie Hall has the diagnosis of obesity and her BMI today is 44.32 Connie Hall is in the action stage of change   ADVISE: Connie Hall was educated on the multiple health risks of obesity as well as the benefit of weight loss to improve her health. She was advised of the need for long term treatment and the importance of lifestyle modifications.  AGREE: Multiple dietary modification options and treatment options were discussed and  Connie Hall agreed to the above obesity treatment plan.  Wilhemena Durie, am acting as transcriptionist for Abby Potash, PA-C I, Abby Potash, PA-C have reviewed above note and agree with its content

## 2018-05-08 ENCOUNTER — Ambulatory Visit (INDEPENDENT_AMBULATORY_CARE_PROVIDER_SITE_OTHER): Payer: 59 | Admitting: Physician Assistant

## 2018-05-14 DIAGNOSIS — G5601 Carpal tunnel syndrome, right upper limb: Secondary | ICD-10-CM | POA: Diagnosis not present

## 2018-05-14 DIAGNOSIS — M25511 Pain in right shoulder: Secondary | ICD-10-CM | POA: Diagnosis not present

## 2018-05-15 ENCOUNTER — Ambulatory Visit (INDEPENDENT_AMBULATORY_CARE_PROVIDER_SITE_OTHER): Payer: 59 | Admitting: Physician Assistant

## 2018-05-15 VITALS — BP 110/71 | HR 83 | Temp 98.3°F | Ht <= 58 in | Wt 214.0 lb

## 2018-05-15 DIAGNOSIS — Z9189 Other specified personal risk factors, not elsewhere classified: Secondary | ICD-10-CM | POA: Diagnosis not present

## 2018-05-15 DIAGNOSIS — E559 Vitamin D deficiency, unspecified: Secondary | ICD-10-CM

## 2018-05-15 DIAGNOSIS — R7303 Prediabetes: Secondary | ICD-10-CM

## 2018-05-15 DIAGNOSIS — Z6841 Body Mass Index (BMI) 40.0 and over, adult: Secondary | ICD-10-CM

## 2018-05-15 MED ORDER — VITAMIN D (ERGOCALCIFEROL) 1.25 MG (50000 UNIT) PO CAPS: 50000.0000 [IU] | ORAL_CAPSULE | ORAL | 0 refills | Status: DC

## 2018-05-15 MED ORDER — METFORMIN HCL 500 MG PO TABS: 500.0000 mg | ORAL_TABLET | Freq: Every day | ORAL | 0 refills | Status: DC

## 2018-05-15 MED FILL — metFORMIN HCL 500 MG TABS: 500 | 30 days supply | Qty: 30 | Fill #0

## 2018-05-15 MED FILL — VIT D2 1.25 MG (50,000 UNIT: 1.25 MG | 28 days supply | Qty: 2 | Fill #0

## 2018-05-15 MED FILL — MELOXICAM 15 MG TABLET: 15 | 30 days supply | Qty: 30 | Fill #0

## 2018-05-19 NOTE — Progress Notes (Signed)
Office: [phone]  /  Fax: [phone]   HPI:   Chief Complaint: OBESITY Connie Hall is here to discuss her progress with her obesity treatment plan. She is on the keep a food journal with 1100 to 1200 calories and 80 grams of protein daily and is following her eating plan approximately 0 % of the time. She states she is doing yard work for 9 hours 1 time per week. Connie Hall was at a conference and reports that she is not journaling or following the plan. Connie Hall is ready to get back on track. Her weight is 214 lb (97.1 kg) today and has not lost weight since her last visit. She has lost 18 lbs since starting treatment with [place].  Pre-Diabetes Connie Hall has a diagnosis of prediabetes based on her elevated Hgb A1c and was informed this puts her at greater risk of developing diabetes. She is taking metformin currently and continues to work on diet and exercise to decrease risk of diabetes. She denies polyphagia or hypoglycemia.  At risk for diabetes Connie Hall is at higher than average risk for developing diabetes due to her obesity. She currently denies polyuria or polydipsia.  Vitamin D deficiency Connie Hall has a diagnosis of vitamin D deficiency. She is currently taking vit D and denies nausea, vomiting or muscle weakness.  ALLERGIES: Allergies  Allergen Reactions  . Sulfonamide Derivatives Hives    MEDICATIONS: Current Outpatient Medications on File Prior to Visit  Medication Sig Dispense Refill  . ALLEGRA-D ALLERGY & CONGESTION 60-120 MG 12 hr tablet TAKE 1 TABLET BY MOUTH TWICE A DAY 180 tablet 3  . atorvastatin (LIPITOR) 10 MG tablet TAKE 1 TABLET BY MOUTH ONCE DAILY 90 tablet 3  . fluticasone (FLONASE) 50 MCG/ACT nasal spray Place 2 sprays into both nostrils daily. 16 g 1  . furosemide (LASIX) 40 MG tablet Take 1 tablet (40 mg total) by mouth daily. 30 tablet 3  . Multiple Vitamins-Minerals (MULTIVITAMIN WITH MINERALS) tablet Take 1 tablet by mouth daily.    [name] rOPINIRole (REQUIP) 0.5 MG  tablet TAKE 1 TABLET BY MOUTH IN THE AFTERNOON AND 2 TABLETS BY MOUTH AT BEDTIME 270 tablet 11  . vitamin C (ASCORBIC ACID) 500 MG tablet Take 500 mg by mouth daily.    [name] zinc gluconate 50 MG tablet Take 50 mg by mouth daily.     No current facility-administered medications on file prior to visit.     PAST MEDICAL HISTORY: Past Medical History:  Diagnosis Date  . Back pain   . Deviated nasal septum   . Discoid lupus   . DM (diabetes mellitus) ([place])   . Family history of pancreatic cancer   . Family history of prostate cancer   . History of discoid lupus erythematosus   . Hyperlipidemia   . Leg edema   . Obesity   . OSA (obstructive sleep apnea)   . Prediabetes   . Restless legs syndrome (RLS)   . Vitamin D deficiency     PAST SURGICAL HISTORY: Past Surgical History:  Procedure Laterality Date  . ROTATOR CUFF REPAIR     left  . Sebaceous cyst removal, back  03/2008    SOCIAL HISTORY: Social History   Tobacco Use  . Smoking status: Former Smoker    Packs/day: 1.00    Years: 15.00    Pack years: 15.00    Types: Cigarettes    Last attempt to quit: 08/27/1998    Years since quitting: 19.7  . Smokeless tobacco: Never Used  Substance Use Topics  . Alcohol use: Yes    Comment: rare  . Drug use: No    FAMILY HISTORY: Family History  Problem Relation Age of Onset  . Hypertension Father   . Stroke Father   . Heart disease Father   . Diabetes Father   . Pancreatic cancer Father 42  . Prostate cancer Father        dx in his 35s; prostatectomy  . Sleep apnea Father   . Obesity Father   . Skin cancer Mother   . Hyperlipidemia Mother   . Pancreatic cancer Sister 11       neuroendocrine tumor  . Colon polyps Sister   . Congestive Heart Failure Maternal Aunt   . Dementia Maternal Uncle   . Lung cancer Paternal Aunt   . Heart attack Paternal Uncle   . Colon polyps Sister   . Colon polyps Sister   . Pancreatic cancer Paternal Uncle 33  . Testicular cancer Cousin          paternal first cousin dx in his 55s    ROS: Review of Systems  Constitutional: Negative for weight loss.  Gastrointestinal: Negative for nausea and vomiting.  Genitourinary: Negative for frequency.  Musculoskeletal:       Negative for muscle weakness   Endo/Heme/Allergies: Negative for polydipsia.       Negative for hypoglycemia Negative for polyphagia    PHYSICAL EXAM: Blood pressure 110/71, pulse 83, temperature 98.3 F (36.8 C), temperature source Oral, height 4\' 10"  (1.473 m), weight 214 lb (97.1 kg), last menstrual period 05/15/2018, SpO2 96 %. Body mass index is 44.73 kg/m. Physical Exam  Constitutional: She is oriented to person, place, and time. She appears well-developed and well-nourished.  Cardiovascular: Normal rate.  Pulmonary/Chest: Effort normal.  Musculoskeletal: Normal range of motion.  Neurological: She is oriented to person, place, and time.  Skin: Skin is warm and dry.  Psychiatric: She has a normal mood and affect. Her behavior is normal.  Vitals reviewed.   RECENT LABS AND TESTS: BMET    Component Value Date/Time   NA 138 03/11/2018 0930   NA 140 12/25/2017 0923   K 4.9 03/11/2018 0930   CL 105 03/11/2018 0930   CO2 25 03/11/2018 0930   GLUCOSE 112 (H) 03/11/2018 0930   BUN 17 03/11/2018 0930   BUN 14 12/25/2017 0923   CREATININE 0.93 03/11/2018 0930   CALCIUM 9.6 03/11/2018 0930   GFRNONAA 72 03/11/2018 0930   GFRAA 84 03/11/2018 0930   Lab Results  Component Value Date   HGBA1C 5.8 (H) 03/11/2018   HGBA1C 6.2 (H) 12/25/2017   HGBA1C 6.0 (H) 08/08/2017   HGBA1C 6.1 (H) 02/07/2017   HGBA1C 6.1 (H) 07/26/2016   Lab Results  Component Value Date   INSULIN 23.0 12/25/2017   CBC    Component Value Date/Time   WBC 8.2 03/11/2018 0930   RBC 4.68 03/11/2018 0930   HGB 13.4 03/11/2018 0930   HCT 40.2 03/11/2018 0930   PLT 298 03/11/2018 0930   MCV 85.9 03/11/2018 0930   MCH 28.6 03/11/2018 0930   MCHC 33.3 03/11/2018 0930    RDW 13.0 03/11/2018 0930   LYMPHSABS 1,714 03/11/2018 0930   MONOABS 480 02/07/2017 1142   EOSABS 131 03/11/2018 0930   BASOSABS 49 03/11/2018 0930   Iron/TIBC/Ferritin/ %Sat    Component Value Date/Time   IRON 64 07/03/2013 0935   TIBC 338 07/03/2013 0935   IRONPCTSAT 19 (L) 07/03/2013 0935  Lipid Panel     Component Value Date/Time   CHOL 143 03/11/2018 0930   CHOL 171 12/25/2017 0923   TRIG 130 03/11/2018 0930   HDL 32 (L) 03/11/2018 0930   HDL 31 (L) 12/25/2017 0923   CHOLHDL 4.5 03/11/2018 0930   VLDL 34 (H) 02/07/2017 1142   LDLCALC 88 03/11/2018 0930   Hepatic Function Panel     Component Value Date/Time   PROT 7.5 03/11/2018 0930   PROT 7.0 12/25/2017 0923   ALBUMIN 4.0 12/25/2017 0923   AST 10 03/11/2018 0930   ALT 9 03/11/2018 0930   ALKPHOS 55 12/25/2017 0923   BILITOT 0.4 03/11/2018 0930   BILITOT 0.3 12/25/2017 0923   BILIDIR 0.1 08/08/2017 0935   IBILI 0.4 08/08/2017 0935      Component Value Date/Time   TSH 1.68 03/11/2018 0930   TSH 1.71 11/26/2017 0958   TSH 2.42 02/07/2017 1142   Results for JAYDYNN, WOLFORD (MRN [id number]) as of 05/19/2018 09:46  Ref. Range 03/11/2018 09:30  Vitamin D, 25-Hydroxy Latest Ref Range: 30 - 100 ng/mL 49   ASSESSMENT AND PLAN: Prediabetes - Plan: metFORMIN (GLUCOPHAGE) 500 MG tablet  Vitamin D deficiency - Plan: Vitamin D, Ergocalciferol, (DRISDOL) 50000 units CAPS capsule  At risk for diabetes mellitus  Class 3 severe obesity with serious comorbidity and body mass index (BMI) of 40.0 to 44.9 in adult, unspecified obesity type ([place])  PLAN:  Pre-Diabetes Connie Hall will continue to work on weight loss, exercise, and decreasing simple carbohydrates in her diet to help decrease the risk of diabetes. We dicussed metformin including benefits and risks. She was informed that eating too many simple carbohydrates or too many calories at one sitting increases the likelihood of GI side effects. Iva agreed to continue  metformin for now and a prescription was written today for 1 month refill. Connie Hall agreed to follow up with [place] as directed to monitor her progress.  Diabetes risk counseling Connie Hall was given extended (15 minutes) diabetes prevention counseling today. She is 52 y.o. female and has risk factors for diabetes including obesity and prediabetes. We discussed intensive lifestyle modifications today with an emphasis on weight loss as well as increasing exercise and decreasing simple carbohydrates in her diet.  Vitamin D Deficiency Connie Hall was informed that low vitamin D levels contributes to fatigue and are associated with obesity, breast, and colon cancer. She agrees to change  prescription Vit D @50 ,000 IU to every other week #2 and will follow up for routine testing of vitamin D, at least 2-3 times per year. She was informed of the risk of over-replacement of vitamin D and agrees to not increase her dose unless she discusses this with [place] first. Connie Hall agrees to follow up as directed.  Obesity Connie Hall is currently in the action stage of change. As such, her goal is to continue with weight loss efforts She has agreed to follow the Category 2 plan Connie Hall has been instructed to work up to a goal of 150 minutes of combined cardio and strengthening exercise per week for weight loss and overall health benefits. We discussed the following Behavioral Modification Strategies today: better snacking choices, increasing lean protein intake and decreasing simple carbohydrates   Connie Hall has agreed to follow up with our clinic in 2 weeks. She was informed of the importance of frequent follow up visits to maximize her success with intensive lifestyle modifications for her multiple health conditions.   OBESITY BEHAVIORAL INTERVENTION VISIT  Today's visit was # 9  Starting weight: 232 lbs Starting date: 12/25/17 Today's weight : 214 lbs  Today's date: 05/15/2018 Total lbs lost to date: 56   ASK: We discussed the  diagnosis of obesity with Connie Hall today and Connie Hall agreed to give [place] permission to discuss obesity behavioral modification therapy today.  ASSESS: Connie Hall has the diagnosis of obesity and her BMI today is 44.74 Connie Hall is in the action stage of change   ADVISE: Connie Hall was educated on the multiple health risks of obesity as well as the benefit of weight loss to improve her health. She was advised of the need for long term treatment and the importance of lifestyle modifications to improve her current health and to decrease her risk of future health problems.  AGREE: Multiple dietary modification options and treatment options were discussed and  Connie Hall agreed to follow the recommendations documented in the above note.  ARRANGE: Connie Hall was educated on the importance of frequent visits to treat obesity as outlined per CMS and USPSTF guidelines and agreed to schedule her next follow up appointment today.  Corey Skains, am acting as transcriptionist for Abby Potash, PA-C I, Abby Potash, PA-C have reviewed above note and agree with its content

## 2018-05-29 ENCOUNTER — Ambulatory Visit (INDEPENDENT_AMBULATORY_CARE_PROVIDER_SITE_OTHER): Payer: 59 | Admitting: Physician Assistant

## 2018-05-29 ENCOUNTER — Encounter (INDEPENDENT_AMBULATORY_CARE_PROVIDER_SITE_OTHER): Payer: Self-pay

## 2018-06-06 ENCOUNTER — Other Ambulatory Visit: Payer: Self-pay | Admitting: Internal Medicine

## 2018-07-07 ENCOUNTER — Other Ambulatory Visit: Payer: Self-pay

## 2018-07-07 DIAGNOSIS — G4733 Obstructive sleep apnea (adult) (pediatric): Secondary | ICD-10-CM | POA: Diagnosis not present

## 2018-07-07 DIAGNOSIS — J00 Acute nasopharyngitis [common cold]: Secondary | ICD-10-CM

## 2018-07-07 MED ORDER — FLUTICASONE PROPIONATE 50 MCG/ACT NA SUSP: 2.0000 | Freq: Every day | NASAL | 11 refills | Status: DC

## 2018-07-07 MED FILL — FLUTICASONE PROP 50 MCG SPR: 50 | 30 days supply | Qty: 16 | Fill #0

## 2018-07-07 MED FILL — ATORVASTATIN 10 MG TABLET: 10 | 90 days supply | Qty: 90 | Fill #3

## 2018-07-07 MED FILL — rOPINIRole HCL 0.5 MG TABS: 0.5 | 90 days supply | Qty: 270 | Fill #3

## 2018-08-04 ENCOUNTER — Telehealth: Payer: Self-pay | Admitting: Pulmonary Disease

## 2018-08-04 NOTE — Telephone Encounter (Signed)
Call made to patient, she states she has an appt tomorrow, she wanted to know how to make a upload. She states she has the app on her phone and she would bring that in. Left message with [employer] asking that they update her profile in [employer]. Nothing further is needed at this time.

## 2018-08-05 ENCOUNTER — Ambulatory Visit (INDEPENDENT_AMBULATORY_CARE_PROVIDER_SITE_OTHER): Payer: 59 | Admitting: Pulmonary Disease

## 2018-08-05 ENCOUNTER — Encounter: Payer: Self-pay | Admitting: Pulmonary Disease

## 2018-08-05 VITALS — BP 116/80 | HR 76 | Ht <= 58 in | Wt 228.0 lb

## 2018-08-05 DIAGNOSIS — G4733 Obstructive sleep apnea (adult) (pediatric): Secondary | ICD-10-CM

## 2018-08-05 DIAGNOSIS — G2581 Restless legs syndrome: Secondary | ICD-10-CM | POA: Diagnosis not present

## 2018-08-05 NOTE — Assessment & Plan Note (Signed)
She seems to be very compliant and CPAP is definitely helped improve her daytime somnolence and fatigue  Weight loss encouraged, compliance with goal of at least 4-6 hrs every night is the expectation. Advised against medications with sedative side effects Cautioned against driving when sleepy - understanding that sleepiness will vary on a day to day basis

## 2018-08-05 NOTE — Patient Instructions (Signed)
CPAP is working well Stay on requip

## 2018-08-05 NOTE — Assessment & Plan Note (Signed)
Stable on current dose of 1 mg of Requip every evening and additional dose of 0.5 mg at 3 PM if her symptoms start sooner

## 2018-08-05 NOTE — Progress Notes (Signed)
   Subjective:    Patient ID: Connie Hall, female    DOB: 08-03-1966, 52 y.o.   MRN: [id number]  HPI   52 yo for FU of severe OSA maintained on cpap 13cm & RLS  She is resting well on nasal pillows, CPAP is maintained at 13 cm no problems with pressure or dryness. CPAP report was reviewed from her phone which shows good control of events and no leak. Symptoms of restless legs occasionally start as early as 3 PM, she mostly takes 2 tablets of Requip at night but 1 mg and this seems to control her symptoms, no frequent awakenings   Significant tests/ events reviewed NPSG 2011: AHI 44/hr  Review of Systems Patient denies significant dyspnea,cough, hemoptysis,  chest pain, palpitations, pedal edema, orthopnea, paroxysmal nocturnal dyspnea, lightheadedness, nausea, vomiting, abdominal or  leg pains      Objective:   Physical Exam  Gen. Pleasant, obese, in no distress ENT - no lesions, no post nasal drip Neck: No JVD, no thyromegaly, no carotid bruits Lungs: no use of accessory muscles, no dullness to percussion, decreased without rales or rhonchi  Cardiovascular: Rhythm regular, heart sounds  normal, no murmurs or gallops, no peripheral edema Musculoskeletal: No deformities, no cyanosis or clubbing , no tremors       Assessment & Plan:

## 2018-08-06 ENCOUNTER — Telehealth: Payer: Self-pay | Admitting: Internal Medicine

## 2018-08-06 NOTE — Telephone Encounter (Signed)
Patient states that her Sister from [place] has been diagnosed with Shingles today.  She is coming to [facility] for Christmas.  She wants to know if she needs to be concerned about catching the shingles from her sister.  She has not been vaccinated for the Shingles at this point.  She HAS had the chicken pox as a child.  She wants to know if she needs to get the shot?  They will be sharing a bed because of everyone coming to her house from out of town.  And, it will be a FULL house.    Spoke with Dr. Renold Genta and she advised that as long as patient has had the chicken pox, she will NOT catch the shingles from her sister.  She does not recommend that they share a bed, but she will not catch the shingles from her.    Called the patient back to share this information.  She see's Dr. Renold Genta in January and she will ask her then when she recommends that she take this injection.

## 2018-09-09 ENCOUNTER — Other Ambulatory Visit: Payer: 59 | Admitting: Internal Medicine

## 2018-09-09 DIAGNOSIS — E785 Hyperlipidemia, unspecified: Secondary | ICD-10-CM | POA: Diagnosis not present

## 2018-09-09 DIAGNOSIS — R7303 Prediabetes: Secondary | ICD-10-CM | POA: Diagnosis not present

## 2018-09-10 LAB — MICROALBUMIN / CREATININE URINE RATIO
Creatinine, Urine: 40 mg/dL (ref 20–275)
Microalb Creat Ratio: 8 mcg/mg creat (ref <30)
Microalb, Ur: 0.3 mg/dL

## 2018-09-10 LAB — HEPATIC FUNCTION PANEL
AG RATIO: 1.4 (calc) (ref 1.0–2.5)
ALT: 17 U/L (ref 6–29)
AST: 14 U/L (ref 10–35)
Albumin: 4 g/dL (ref 3.6–5.1)
Alkaline phosphatase (APISO): 43 U/L (ref 33–130)
Bilirubin, Direct: 0.1 mg/dL (ref 0.0–0.2)
Globulin: 2.8 g/dL (calc) (ref 1.9–3.7)
Indirect Bilirubin: 0.2 mg/dL (calc) (ref 0.2–1.2)
TOTAL PROTEIN: 6.8 g/dL (ref 6.1–8.1)
Total Bilirubin: 0.3 mg/dL (ref 0.2–1.2)

## 2018-09-10 LAB — HEMOGLOBIN A1C
Hgb A1c MFr Bld: 6.4 % of total Hgb — ABNORMAL HIGH (ref <5.7)
Mean Plasma Glucose: 137 (calc)
eAG (mmol/L): 7.6 (calc)

## 2018-09-10 LAB — LIPID PANEL
Cholesterol: 163 mg/dL (ref <200)
HDL: 42 mg/dL — AB (ref >50)
LDL Cholesterol (Calc): 92 mg/dL (calc)
Non-HDL Cholesterol (Calc): 121 mg/dL (calc) (ref <130)
Total CHOL/HDL Ratio: 3.9 (calc) (ref <5.0)
Triglycerides: 196 mg/dL — ABNORMAL HIGH (ref <150)

## 2018-09-11 ENCOUNTER — Ambulatory Visit (INDEPENDENT_AMBULATORY_CARE_PROVIDER_SITE_OTHER): Payer: 59 | Admitting: Internal Medicine

## 2018-09-11 ENCOUNTER — Encounter: Payer: Self-pay | Admitting: Internal Medicine

## 2018-09-11 VITALS — HR 79 | Ht <= 58 in | Wt 238.0 lb

## 2018-09-11 DIAGNOSIS — Z6841 Body Mass Index (BMI) 40.0 and over, adult: Secondary | ICD-10-CM | POA: Diagnosis not present

## 2018-09-11 DIAGNOSIS — E781 Pure hyperglyceridemia: Secondary | ICD-10-CM

## 2018-09-11 DIAGNOSIS — E119 Type 2 diabetes mellitus without complications: Secondary | ICD-10-CM

## 2018-09-11 DIAGNOSIS — G4733 Obstructive sleep apnea (adult) (pediatric): Secondary | ICD-10-CM | POA: Diagnosis not present

## 2018-09-11 NOTE — Progress Notes (Signed)
   Subjective:    Patient ID: Connie Hall, female    DOB: 12-23-1965, 53 y.o.   MRN: [id number]  HPI  Last period November 11. Was spotting in December for almost all month. Will continue to monitor.  Rx for Shingrix   Right shoulder pain- is to see Dr. Mardelle Matte  Has had some low back- is getting new mattress  Going to [place] at the end of May.  Would like to have some Xanax for travel.  Small quantity prescribed.  History of sleep apnea treated with CPAP followed by pulmonologist  Lipid panel shows triglycerides of 196, total cholesterol of 163, low HDL of 42 and normal LDL of 92.  Liver functions are normal.  Hemoglobin A1c 6.4% which is increased significantly since July when it was 5.8%.  Is on Lipitor 10 mg daily.  Takes Requip for restless leg syndrome.        Review of Systems see above     Objective:   Physical Exam Skin warm and dry.  Nodes none.  Neck is supple.  Chest clear.  Vital signs reviewed.  Extremities without edema.       Assessment & Plan:  Increase in hemoglobin A1c- please discuss with Dr. Leafy Ro  Morbid obesity-BMI 49  Hypertriglyceridemia-needs to get back on diet and exercise program  History of sleep apnea treated with CPAP  Travel advice-refill Xanax for sleep on trip to [place]  Plan: Work on diet exercise and weight loss and follow-up in 6 months.

## 2018-09-26 MED ORDER — ALPRAZOLAM 1 MG PO TABS: 1.0000 mg | ORAL_TABLET | Freq: Two times a day (BID) | ORAL | 0 refills | Status: DC | PRN

## 2018-09-26 NOTE — Patient Instructions (Addendum)
Please follow-up with Dr. Leafy Ro regarding elevated hemoglobin A1c and elevated triglycerides.  Please work on diet and exercise and follow-up in August

## 2018-09-29 MED FILL — ALPRAZolam 1 MG TABS: 1 | 10 days supply | Qty: 20 | Fill #0

## 2018-09-30 MED FILL — SHINGRIX 50 MCG SUS: 50 | 1 days supply | Qty: 1 | Fill #0

## 2018-10-06 DIAGNOSIS — G4733 Obstructive sleep apnea (adult) (pediatric): Secondary | ICD-10-CM | POA: Diagnosis not present

## 2018-10-07 ENCOUNTER — Other Ambulatory Visit: Payer: Self-pay | Admitting: Internal Medicine

## 2018-10-07 MED FILL — ATORVASTATIN 10 MG TABLET: 10 | 90 days supply | Qty: 90 | Fill #0 | Status: TO

## 2018-10-07 MED FILL — rOPINIRole HCL 0.5 MG TABS: 0.5 | 90 days supply | Qty: 270 | Fill #0 | Status: TO

## 2018-10-22 DIAGNOSIS — H5213 Myopia, bilateral: Secondary | ICD-10-CM | POA: Diagnosis not present

## 2018-10-22 DIAGNOSIS — H52223 Regular astigmatism, bilateral: Secondary | ICD-10-CM | POA: Diagnosis not present

## 2018-10-22 DIAGNOSIS — H524 Presbyopia: Secondary | ICD-10-CM | POA: Diagnosis not present

## 2018-11-13 ENCOUNTER — Ambulatory Visit (INDEPENDENT_AMBULATORY_CARE_PROVIDER_SITE_OTHER): Payer: 59 | Admitting: Internal Medicine

## 2018-11-13 ENCOUNTER — Other Ambulatory Visit: Payer: Self-pay

## 2018-11-13 ENCOUNTER — Telehealth: Payer: 59 | Admitting: Physician Assistant

## 2018-11-13 ENCOUNTER — Encounter: Payer: Self-pay | Admitting: Internal Medicine

## 2018-11-13 VITALS — BP 100/60 | HR 80 | Temp 98.5°F | Ht <= 58 in | Wt 238.0 lb

## 2018-11-13 DIAGNOSIS — J329 Chronic sinusitis, unspecified: Secondary | ICD-10-CM | POA: Diagnosis not present

## 2018-11-13 DIAGNOSIS — J0100 Acute maxillary sinusitis, unspecified: Secondary | ICD-10-CM | POA: Diagnosis not present

## 2018-11-13 DIAGNOSIS — H6503 Acute serous otitis media, bilateral: Secondary | ICD-10-CM

## 2018-11-13 DIAGNOSIS — B9789 Other viral agents as the cause of diseases classified elsewhere: Secondary | ICD-10-CM | POA: Diagnosis not present

## 2018-11-13 MED ORDER — AZITHROMYCIN 250 MG PO TABS: ORAL_TABLET | ORAL | 1 refills | Status: DC

## 2018-11-13 MED FILL — AZITHROMYCIN 250 MG TABLET: 250 | 5 days supply | Qty: 6 | Fill #0 | Status: TO

## 2018-11-13 NOTE — Progress Notes (Signed)
We are sorry that you are not feeling well.  Here is how we plan to help!  Based on what you have shared with me it looks like you have sinusitis.  Sinusitis is inflammation and infection in the sinus cavities of the head.  Based on your presentation I believe you most likely have Acute Viral Sinusitis.This is an infection most likely caused by a virus. There is not specific treatment for viral sinusitis other than to help you with the symptoms until the infection runs its course.  You may use an oral decongestant such as Mucinex D or if you have glaucoma or high blood pressure use plain Mucinex. Saline nasal spray help and can safely be used as often as needed for congestion. You mentioned some red nasal drainage. I see you have Flonase on your medication list. If you are using daily, I would actually cut back on this to every other day or only 1 spray in each nostril as if used too much can cause nose bleeds (which would cause there to be some reddish-brown coloration in nasal discharge) If this is not improving within 24-48 hours, please message [place] and we will consider starting antibiotic,  Some authorities believe that zinc sprays or the use of Echinacea may shorten the course of your symptoms.  Sinus infections are not as easily transmitted as other respiratory infection, however we still recommend that you avoid close contact with loved ones, especially the very young and elderly.  Remember to wash your hands thoroughly throughout the day as this is the number one way to prevent the spread of infection!  Home Care:  Only take medications as instructed by your medical team.  Do not take these medications with alcohol.  A steam or ultrasonic humidifier can help congestion.  You can place a towel over your head and breathe in the steam from hot water coming from a faucet.  Avoid close contacts especially the very young and the elderly.  Cover your mouth when you cough or sneeze.  Always  remember to wash your hands.  Get Help Right Away If:  You develop worsening fever or sinus pain.  You develop a severe head ache or visual changes.  Your symptoms persist after you have completed your treatment plan.  Make sure you  Understand these instructions.  Will watch your condition.  Will get help right away if you are not doing well or get worse.  Your e-visit answers were reviewed by a board certified advanced clinical practitioner to complete your personal care plan.  Depending on the condition, your plan could have included both over the counter or prescription medications.  If there is a problem please reply  once you have received a response from your provider.  Your safety is important to [place].  If you have drug allergies check your prescription carefully.    You can use MyChart to ask questions about today's visit, request a non-urgent call back, or ask for a work or school excuse for 24 hours related to this e-Visit. If it has been greater than 24 hours you will need to follow up with your provider, or enter a new e-Visit to address those concerns.  You will get an e-mail in the next two days asking about your experience.  I hope that your e-visit has been valuable and will speed your recovery. Thank you for using e-visits.

## 2018-11-13 NOTE — Progress Notes (Signed)
   Subjective:    Patient ID: Connie Hall, female    DOB: 1966/08/04, 53 y.o.   MRN: [id number]  HPI Pt in with 2 day history of sinus pressure nasal congestion and ear discomfort. Some cough with sputum production. No fever or shaking chills.    Review of Systems see above     Objective:   Physical Exam Blood pressure 100/60 pulse 80 temperature 98.5 pulse oximetry 97% on room air she sounds nasally congested when she speaks.  TMs are full and pink bilaterally.  Pharynx is slightly injected without exudate.  Neck is supple without adenopathy.  Chest is clear to auscultation without rales or wheezing.       Assessment & Plan:  Acute maxillary sinusitis  Acute bilateral serous otitis media  Plan: Zithromax Z-PAK take 2 tablets day 1 followed by 1 tablet days 2 through 5 with 1 refill.  If not better in 7 to 10 days repeat Z-Pak.  Rest and drink plenty of fluids.

## 2018-11-13 NOTE — Patient Instructions (Signed)
Zithromax Z-PAK take 2 tablets day 1 followed by 1 tablet days 2 through 5 with 1 refill.  If not better in 7 to 10 days repeat Z-Pak.  Rest and drink plenty of fluids.

## 2018-11-13 NOTE — Progress Notes (Signed)
I have spent 5 minutes in review of e-visit questionnaire, review and updating patient chart, medical decision making and response to patient.   Alsie Younes Cody Jester Klingberg, PA-C    

## 2018-12-04 MED ORDER — TRIAMCINOLONE ACETONIDE 0.1 % EX CREA: 1.0000 "application " | TOPICAL_CREAM | Freq: Three times a day (TID) | CUTANEOUS | 0 refills | Status: DC

## 2018-12-04 MED FILL — FLUTICASONE PROP 50 MCG SPR: 50 | 30 days supply | Qty: 16 | Fill #0

## 2018-12-04 MED FILL — AZITHROMYCIN 250 MG TABLET: 250 | 5 days supply | Qty: 6 | Fill #0

## 2018-12-04 MED FILL — TRIAMCINOLONE 0.1% CREAM: 0.1 | 10 days supply | Qty: 30 | Fill #0

## 2018-12-04 NOTE — Telephone Encounter (Signed)
Stung by wasp. Has local reaction. Call in Triamcinolone cream tid and take antihistamine.

## 2018-12-09 MED FILL — rOPINIRole HCL 0.5 MG TABS: 0.5 | 90 days supply | Qty: 270 | Fill #0 | Status: TO

## 2018-12-09 MED FILL — ATORVASTATIN 10 MG TABLET: 10 | 90 days supply | Qty: 90 | Fill #0 | Status: TO

## 2018-12-30 MED FILL — FLUTICASONE PROP 50 MCG SPR: 50 | 30 days supply | Qty: 16 | Fill #1 | Status: TO

## 2019-01-05 DIAGNOSIS — G4733 Obstructive sleep apnea (adult) (pediatric): Secondary | ICD-10-CM | POA: Diagnosis not present

## 2019-01-21 DIAGNOSIS — M7712 Lateral epicondylitis, left elbow: Secondary | ICD-10-CM | POA: Diagnosis not present

## 2019-02-12 MED FILL — MELOXICAM 15 MG TABLET: 15 | 30 days supply | Qty: 30 | Fill #0

## 2019-02-25 MED FILL — SHINGRIX 50 MCG SUS: 50 | 1 days supply | Qty: 1 | Fill #1

## 2019-03-17 MED FILL — rOPINIRole HCL 0.5 MG TABS: 0.5 | 90 days supply | Qty: 270 | Fill #0

## 2019-03-17 MED FILL — ATORVASTATIN 10 MG TABLET: 10 | 90 days supply | Qty: 90 | Fill #0

## 2019-04-01 DIAGNOSIS — L918 Other hypertrophic disorders of the skin: Secondary | ICD-10-CM | POA: Diagnosis not present

## 2019-04-01 DIAGNOSIS — L718 Other rosacea: Secondary | ICD-10-CM | POA: Diagnosis not present

## 2019-04-01 DIAGNOSIS — L812 Freckles: Secondary | ICD-10-CM | POA: Diagnosis not present

## 2019-04-01 DIAGNOSIS — L720 Epidermal cyst: Secondary | ICD-10-CM | POA: Diagnosis not present

## 2019-04-01 DIAGNOSIS — L814 Other melanin hyperpigmentation: Secondary | ICD-10-CM | POA: Diagnosis not present

## 2019-04-02 MED FILL — metroNIDAZOLE 0.75 % CREA: 0.75 | 30 days supply | Qty: 45 | Fill #0

## 2019-04-02 MED FILL — MELOXICAM 15 MG TABLET: 15 | 30 days supply | Qty: 30 | Fill #1

## 2019-04-02 MED FILL — TRETINOIN 0.025 % CREA: 0.025 | 30 days supply | Qty: 20 | Fill #0

## 2019-04-07 DIAGNOSIS — G4733 Obstructive sleep apnea (adult) (pediatric): Secondary | ICD-10-CM | POA: Diagnosis not present

## 2019-04-21 ENCOUNTER — Other Ambulatory Visit: Payer: Self-pay

## 2019-04-21 ENCOUNTER — Other Ambulatory Visit: Payer: 59 | Admitting: Internal Medicine

## 2019-04-21 ENCOUNTER — Ambulatory Visit (INDEPENDENT_AMBULATORY_CARE_PROVIDER_SITE_OTHER): Payer: 59 | Admitting: Internal Medicine

## 2019-04-21 ENCOUNTER — Encounter: Payer: Self-pay | Admitting: Internal Medicine

## 2019-04-21 VITALS — BP 140/90 | HR 87 | Temp 98.0°F | Ht <= 58 in | Wt 238.0 lb

## 2019-04-21 DIAGNOSIS — Z0000 Encounter for general adult medical examination without abnormal findings: Secondary | ICD-10-CM | POA: Diagnosis not present

## 2019-04-21 DIAGNOSIS — G4730 Sleep apnea, unspecified: Secondary | ICD-10-CM

## 2019-04-21 DIAGNOSIS — G2581 Restless legs syndrome: Secondary | ICD-10-CM | POA: Diagnosis not present

## 2019-04-21 DIAGNOSIS — E119 Type 2 diabetes mellitus without complications: Secondary | ICD-10-CM

## 2019-04-21 DIAGNOSIS — Z6841 Body Mass Index (BMI) 40.0 and over, adult: Secondary | ICD-10-CM | POA: Diagnosis not present

## 2019-04-21 DIAGNOSIS — R350 Frequency of micturition: Secondary | ICD-10-CM | POA: Diagnosis not present

## 2019-04-21 DIAGNOSIS — E781 Pure hyperglyceridemia: Secondary | ICD-10-CM

## 2019-04-21 DIAGNOSIS — R8290 Unspecified abnormal findings in urine: Secondary | ICD-10-CM | POA: Diagnosis not present

## 2019-04-21 LAB — POCT URINALYSIS DIPSTICK
Bilirubin, UA: NEGATIVE
Glucose, UA: NEGATIVE
Ketones, UA: NEGATIVE
Nitrite, UA: NEGATIVE
Protein, UA: POSITIVE — AB
Spec Grav, UA: 1.01 (ref 1.010–1.025)
Urobilinogen, UA: 0.2 E.U./dL
pH, UA: 6.5 (ref 5.0–8.0)

## 2019-04-21 MED ORDER — CIPROFLOXACIN HCL 250 MG PO TABS: 250.0000 mg | ORAL_TABLET | Freq: Two times a day (BID) | ORAL | 0 refills | Status: DC

## 2019-04-21 MED FILL — CIPROFLOXACIN HCL 250 MG TA: 250 | 5 days supply | Qty: 10 | Fill #0

## 2019-04-21 NOTE — Patient Instructions (Signed)
Urine specimen abnormal.  Sent for culture.  Cipro 250 mg twice a day for 5 days.

## 2019-04-21 NOTE — Progress Notes (Signed)
Here for labs and has UTI symptoms. Urine dipstick abnormal. Treat with Cipro 250 mg twice a day x 5 days

## 2019-04-22 LAB — CBC WITH DIFFERENTIAL/PLATELET
Absolute Monocytes: 411 cells/uL (ref 200–950)
Basophils Absolute: 63 cells/uL (ref 0–200)
Basophils Relative: 0.8 %
Eosinophils Absolute: 150 cells/uL (ref 15–500)
Eosinophils Relative: 1.9 %
HCT: 38.4 % (ref 35.0–45.0)
Hemoglobin: 12.3 g/dL (ref 11.7–15.5)
Lymphs Abs: 1730 cells/uL (ref 850–3900)
MCH: 27.4 pg (ref 27.0–33.0)
MCHC: 32 g/dL (ref 32.0–36.0)
MCV: 85.5 fL (ref 80.0–100.0)
MPV: 10.1 fL (ref 7.5–12.5)
Monocytes Relative: 5.2 %
Neutro Abs: 5546 cells/uL (ref 1500–7800)
Neutrophils Relative %: 70.2 %
Platelets: 306 10*3/uL (ref 140–400)
RBC: 4.49 10*6/uL (ref 3.80–5.10)
RDW: 13.5 % (ref 11.0–15.0)
Total Lymphocyte: 21.9 %
WBC: 7.9 10*3/uL (ref 3.8–10.8)

## 2019-04-22 LAB — VITAMIN D 25 HYDROXY (VIT D DEFICIENCY, FRACTURES): Vit D, 25-Hydroxy: 28 ng/mL — ABNORMAL LOW (ref 30–100)

## 2019-04-22 LAB — LIPID PANEL
Cholesterol: 151 mg/dL (ref <200)
HDL: 39 mg/dL — ABNORMAL LOW (ref >=50)
LDL Cholesterol (Calc): 88 mg/dL (calc)
Non-HDL Cholesterol (Calc): 112 mg/dL (calc) (ref <130)
Total CHOL/HDL Ratio: 3.9 (calc) (ref <5.0)
Triglycerides: 144 mg/dL (ref <150)

## 2019-04-22 LAB — COMPLETE METABOLIC PANEL WITH GFR
AG Ratio: 1.5 (calc) (ref 1.0–2.5)
ALT: 15 U/L (ref 6–29)
AST: 11 U/L (ref 10–35)
Albumin: 4.1 g/dL (ref 3.6–5.1)
Alkaline phosphatase (APISO): 43 U/L (ref 37–153)
BUN: 13 mg/dL (ref 7–25)
CO2: 24 mmol/L (ref 20–32)
Calcium: 9.1 mg/dL (ref 8.6–10.4)
Chloride: 104 mmol/L (ref 98–110)
Creat: 0.82 mg/dL (ref 0.50–1.05)
GFR, Est African American: 96 mL/min/{1.73_m2} (ref >=60)
GFR, Est Non African American: 83 mL/min/{1.73_m2} (ref >=60)
Globulin: 2.7 g/dL (calc) (ref 1.9–3.7)
Glucose, Bld: 129 mg/dL — ABNORMAL HIGH (ref 65–99)
Potassium: 4.4 mmol/L (ref 3.5–5.3)
Sodium: 138 mmol/L (ref 135–146)
Total Bilirubin: 0.3 mg/dL (ref 0.2–1.2)
Total Protein: 6.8 g/dL (ref 6.1–8.1)

## 2019-04-22 LAB — TSH: TSH: 2.18 mIU/L

## 2019-04-22 LAB — HEMOGLOBIN A1C
Hgb A1c MFr Bld: 6.7 % of total Hgb — ABNORMAL HIGH (ref <5.7)
Mean Plasma Glucose: 146 (calc)
eAG (mmol/L): 8.1 (calc)

## 2019-04-23 ENCOUNTER — Encounter: Payer: Self-pay | Admitting: Internal Medicine

## 2019-04-23 ENCOUNTER — Ambulatory Visit (INDEPENDENT_AMBULATORY_CARE_PROVIDER_SITE_OTHER): Payer: 59 | Admitting: Internal Medicine

## 2019-04-23 ENCOUNTER — Other Ambulatory Visit: Payer: Self-pay

## 2019-04-23 VITALS — BP 140/90 | HR 88 | Temp 98.3°F | Ht <= 58 in | Wt 251.0 lb

## 2019-04-23 DIAGNOSIS — G2581 Restless legs syndrome: Secondary | ICD-10-CM | POA: Diagnosis not present

## 2019-04-23 DIAGNOSIS — Z0000 Encounter for general adult medical examination without abnormal findings: Secondary | ICD-10-CM

## 2019-04-23 DIAGNOSIS — E119 Type 2 diabetes mellitus without complications: Secondary | ICD-10-CM

## 2019-04-23 DIAGNOSIS — G4733 Obstructive sleep apnea (adult) (pediatric): Secondary | ICD-10-CM

## 2019-04-23 DIAGNOSIS — J309 Allergic rhinitis, unspecified: Secondary | ICD-10-CM

## 2019-04-23 DIAGNOSIS — E559 Vitamin D deficiency, unspecified: Secondary | ICD-10-CM

## 2019-04-23 DIAGNOSIS — Z6841 Body Mass Index (BMI) 40.0 and over, adult: Secondary | ICD-10-CM

## 2019-04-23 LAB — URINE CULTURE
MICRO NUMBER:: 808501
Result:: NO GROWTH
SPECIMEN QUALITY:: ADEQUATE

## 2019-04-23 LAB — POCT URINALYSIS DIPSTICK
Appearance: NEGATIVE
Bilirubin, UA: NEGATIVE
Glucose, UA: NEGATIVE
Ketones, UA: NEGATIVE
Leukocytes, UA: NEGATIVE
Nitrite, UA: NEGATIVE
Odor: NEGATIVE
Protein, UA: NEGATIVE
Spec Grav, UA: 1.01 (ref 1.010–1.025)
Urobilinogen, UA: 0.2 E.U./dL
pH, UA: 6.5 (ref 5.0–8.0)

## 2019-04-23 NOTE — Progress Notes (Signed)
   Subjective:    Patient ID: Connie Hall, female    DOB: [birth year], 53 y.o.   MRN: [id number]  HPI 53 year old Female for health maintenance exam and evaluation of medication issues.  Hx sleep apnea.Uses CPAP without difficulty. Will receive flu vaccine through employment.  She has seen Dr. Leafy Ro for weight loss but has not followed through lately.  History of impaired glucose tolerance.  Was treated with metformin by Dr. Leafy Ro.  Encouraged her to return to Dr. Leafy Ro for weight loss management.  In 2017 was seen by cornea specialist from [facility] in [place] regarding retinal holes.  She is allergic to sulfa-causes hives  Some pain medications cause nausea.  History of plantar fasciitis, hyperlipidemia, obesity, restless leg syndrome, controlled type 2 diabetes without complication, obstructive sleep apnea, dependent edema, deviated nasal septum, metabolic syndrome and history of vitamin D deficiency.  Past medical history: Left shoulder arthropathy 2009, right medial epicondylitis August 2009.  Right carpal tunnel syndrome September 2009.  Had sebaceous cyst removed from her back by Dr. Zella Richer August 2009.  History of obstructive sleep apnea and has been followed at [facility] and uses CPAP.  Social history: Single, never married.  Does not smoke.  Seldom consumes alcohol.  Has a college degree.  Has family in [place].  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      Review of Systems  Constitutional: Negative.   All other systems reviewed and are negative.      Objective:   Physical Exam Blood pressure 140/90, temperature 98.3 degrees pulse 88 regular pulse oximetry 98%.  Weight 251 pounds.  BMI 52.46  Skin warm and dry.  Nodes none.  TMs clear.  Neck is supple without JVD thyromegaly or carotid bruits.  Chest clear to auscultation.  Cardiac exam regular rate and rhythm normal S1 and S2 without murmurs  or gallops.  Breasts normal female without masses.  Abdomen soft nondistended without hepatosplenomegaly masses or tenderness.  No lower extremity pitting edema.  Neurological exam.  She is oriented to person place and time.  No cranial nerve deficit or sensory deficit.  Coordination is normal.  Psychiatric: Has normal mood and affect.  Behavior is normal.  Thought content normal.       Assessment & Plan:  Type 2 diabetes mellitus-hemoglobin A1c increased from 6.4% in January 20 20 to 6.7%.  Lowest hemoglobin A1c was 5.8% in July 2019-referred to endocrinology  Vitamin D deficiency-placed on 50,000 units weekly.  Vitamin D level was low at 28  Hyperlipidemia treated with statin medication-history of hypertriglyceridemia  Has low HDL of 39  Restless leg syndrome treated with Requip  Allergic rhinitis treated with Flonase  Plan: I would like her to see endocrinologist regarding diabetes mellitus.  I would like for her to return to see Dr. Leafy Ro regarding weight management.  She is agreeable to these suggestions.  Follow-up in 6 months here.

## 2019-04-26 NOTE — Patient Instructions (Signed)
Endocrinology referral for management of diabetes mellitus.  Please return to Dr. Migdalia Dk clinic for weight loss.  Follow-up here in 6 months.

## 2019-07-06 DIAGNOSIS — G4733 Obstructive sleep apnea (adult) (pediatric): Secondary | ICD-10-CM | POA: Diagnosis not present

## 2019-07-09 ENCOUNTER — Other Ambulatory Visit: Payer: Self-pay | Admitting: Internal Medicine

## 2019-07-09 DIAGNOSIS — J00 Acute nasopharyngitis [common cold]: Secondary | ICD-10-CM

## 2019-07-09 MED FILL — FLUTICASONE PROP 50 MCG SPR: 50 | 30 days supply | Qty: 16 | Fill #0

## 2019-07-09 MED FILL — rOPINIRole HCL 0.5 MG TABS: 0.5 | 90 days supply | Qty: 270 | Fill #1

## 2019-07-09 MED FILL — ATORVASTATIN 10 MG TABLET: 10 | 90 days supply | Qty: 90 | Fill #1

## 2019-08-07 ENCOUNTER — Ambulatory Visit (INDEPENDENT_AMBULATORY_CARE_PROVIDER_SITE_OTHER): Payer: 59 | Admitting: Pulmonary Disease

## 2019-08-07 ENCOUNTER — Encounter: Payer: Self-pay | Admitting: Pulmonary Disease

## 2019-08-07 ENCOUNTER — Other Ambulatory Visit: Payer: Self-pay

## 2019-08-07 VITALS — BP 130/72 | HR 81 | Temp 97.6°F | Ht <= 58 in | Wt 250.8 lb

## 2019-08-07 DIAGNOSIS — G2581 Restless legs syndrome: Secondary | ICD-10-CM | POA: Diagnosis not present

## 2019-08-07 DIAGNOSIS — G4733 Obstructive sleep apnea (adult) (pediatric): Secondary | ICD-10-CM | POA: Diagnosis not present

## 2019-08-07 NOTE — Progress Notes (Signed)
   Subjective:    Patient ID: Connie Hall, female    DOB: December 08, 1965, 53 y.o.   MRN: [id number]  HPI  53 y o  for FU of severe OSA maintained on cpap 13cm & RLS She works for [employer] health at the [employer]  Annual follow-up No problems with CPAP mask or pressure, she has settled with nasal pillows.  She travels and has a ResMed air mini.  I reviewed report on her phone which shows excellent compliance, no leak and good control of events.  She does not have dryness  Restless legs-well-controlled on 1 mg of Requip, she does not need the daytime dose.  Last iron check was 2014 looking at labs  Significant tests/ events reviewed NPSG 2011: AHI 44/hr  Review of Systems neg for any significant sore throat, dysphagia, itching, sneezing, nasal congestion or excess/ purulent secretions, fever, chills, sweats, unintended wt loss, pleuritic or exertional cp, hempoptysis, orthopnea pnd or change in chronic leg swelling. Also denies presyncope, palpitations, heartburn, abdominal pain, nausea, vomiting, diarrhea or change in bowel or urinary habits, dysuria,hematuria, rash, arthralgias, visual complaints, headache, numbness weakness or ataxia.     Objective:   Physical Exam   Gen. Pleasant, obese, in no distress ENT - no lesions, no post nasal drip Neck: No JVD, no thyromegaly, no carotid bruits Lungs: no use of accessory muscles, no dullness to percussion, decreased without rales or rhonchi  Cardiovascular: Rhythm regular, heart sounds  normal, no murmurs or gallops, no peripheral edema Musculoskeletal: No deformities, no cyanosis or clubbing , no tremors        Assessment & Plan:

## 2019-08-07 NOTE — Assessment & Plan Note (Signed)
She is compliant and CPAP settings are working well. This is certainly helped improve her daytime somnolence and fatigue  Weight loss encouraged, compliance with goal of at least 4-6 hrs every night is the expectation. Advised against medications with sedative side effects Cautioned against driving when sleepy - understanding that sleepiness will vary on a day to day basis

## 2019-08-07 NOTE — Assessment & Plan Note (Signed)
Current dose of Requip is working, she seldom needs daytime dose No evidence of augmentation

## 2019-08-07 NOTE — Patient Instructions (Signed)
CPAP is working well on current settings of 13 cm. Continue Requip

## 2019-10-01 ENCOUNTER — Other Ambulatory Visit: Payer: Self-pay | Admitting: Internal Medicine

## 2019-10-01 MED FILL — rOPINIRole HCL 0.5 MG TABS: 0.5 | 90 days supply | Qty: 270 | Fill #0

## 2019-10-01 MED FILL — FLUTICASONE PROP 50 MCG SPR: 50 | 30 days supply | Qty: 16 | Fill #1

## 2019-10-01 MED FILL — ATORVASTATIN 10 MG TABLET: 10 | 90 days supply | Qty: 90 | Fill #0

## 2019-10-05 DIAGNOSIS — G4733 Obstructive sleep apnea (adult) (pediatric): Secondary | ICD-10-CM | POA: Diagnosis not present

## 2019-10-26 IMAGING — CR DG CHEST 2V
2 series · 2 of 2 positions shown · non-contrast
Comparison: None.

CLINICAL DATA: Lower extremity edema.

EXAM:
CHEST - 2 VIEW

[w chest pa]
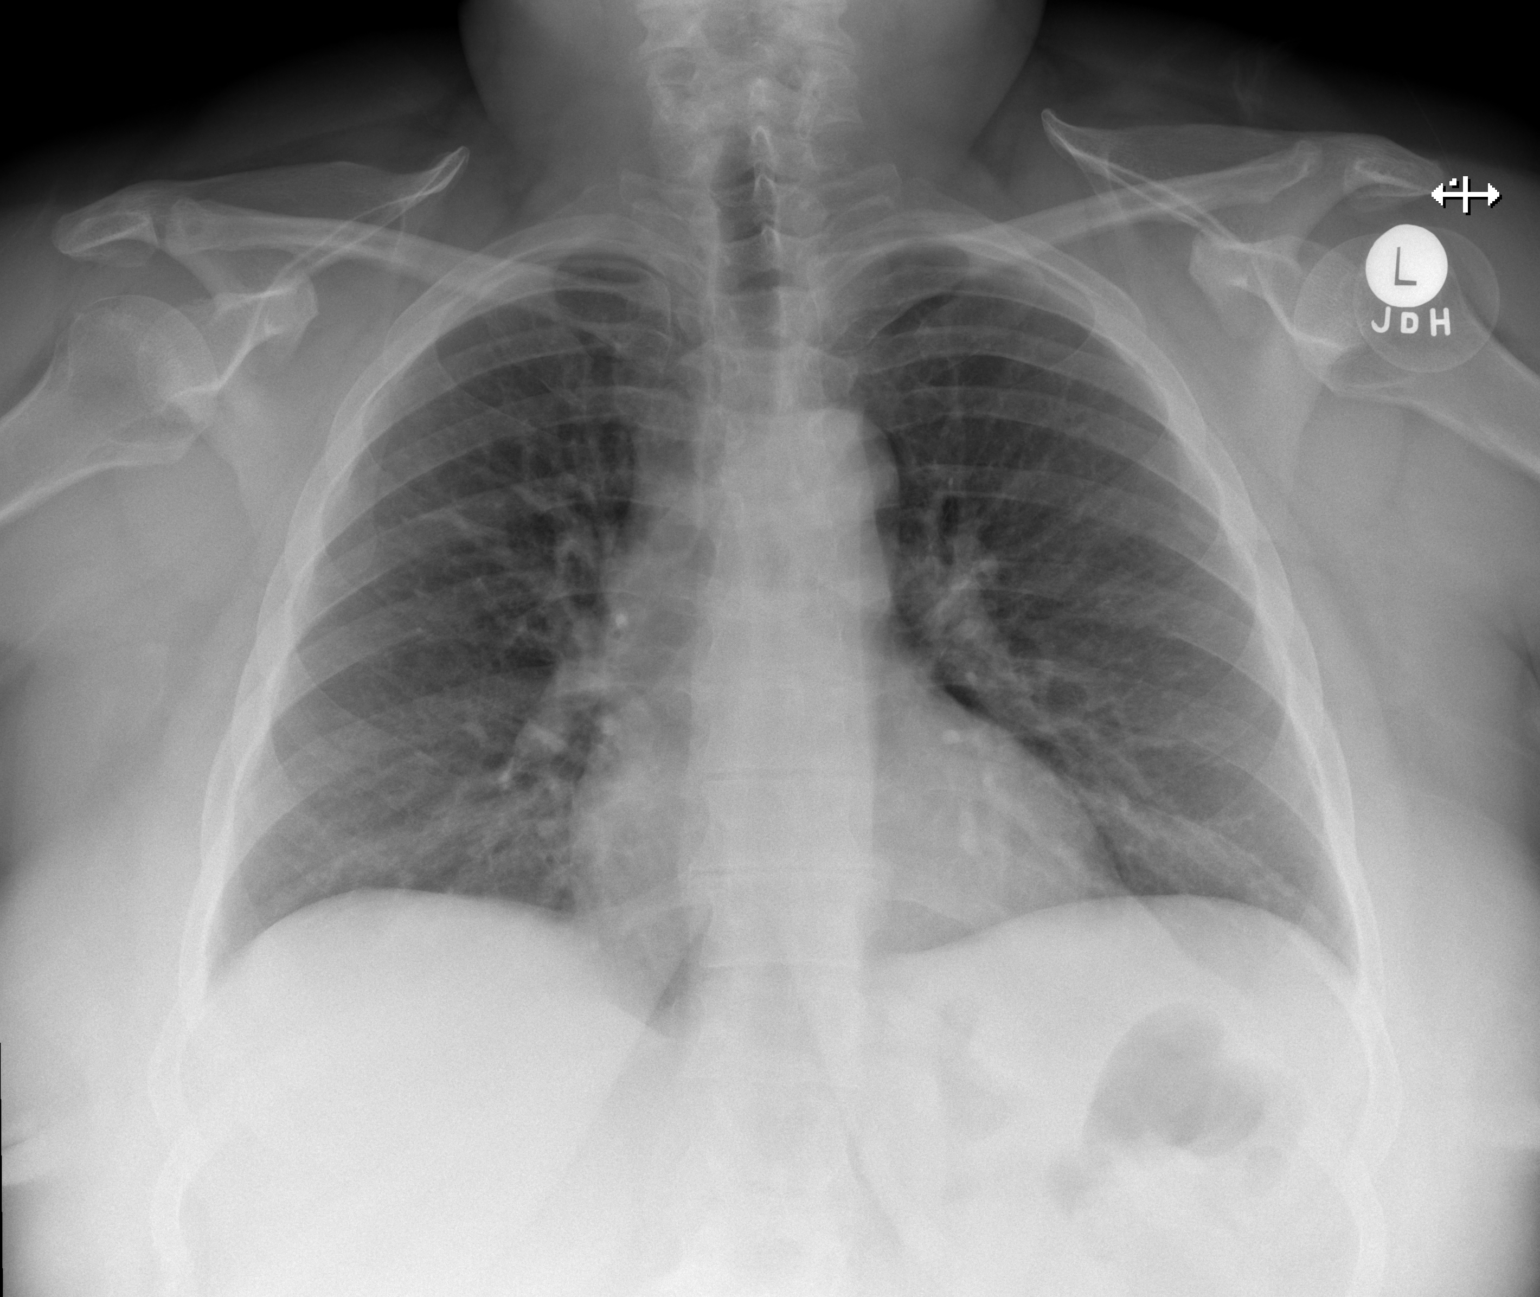

[w chest lat]
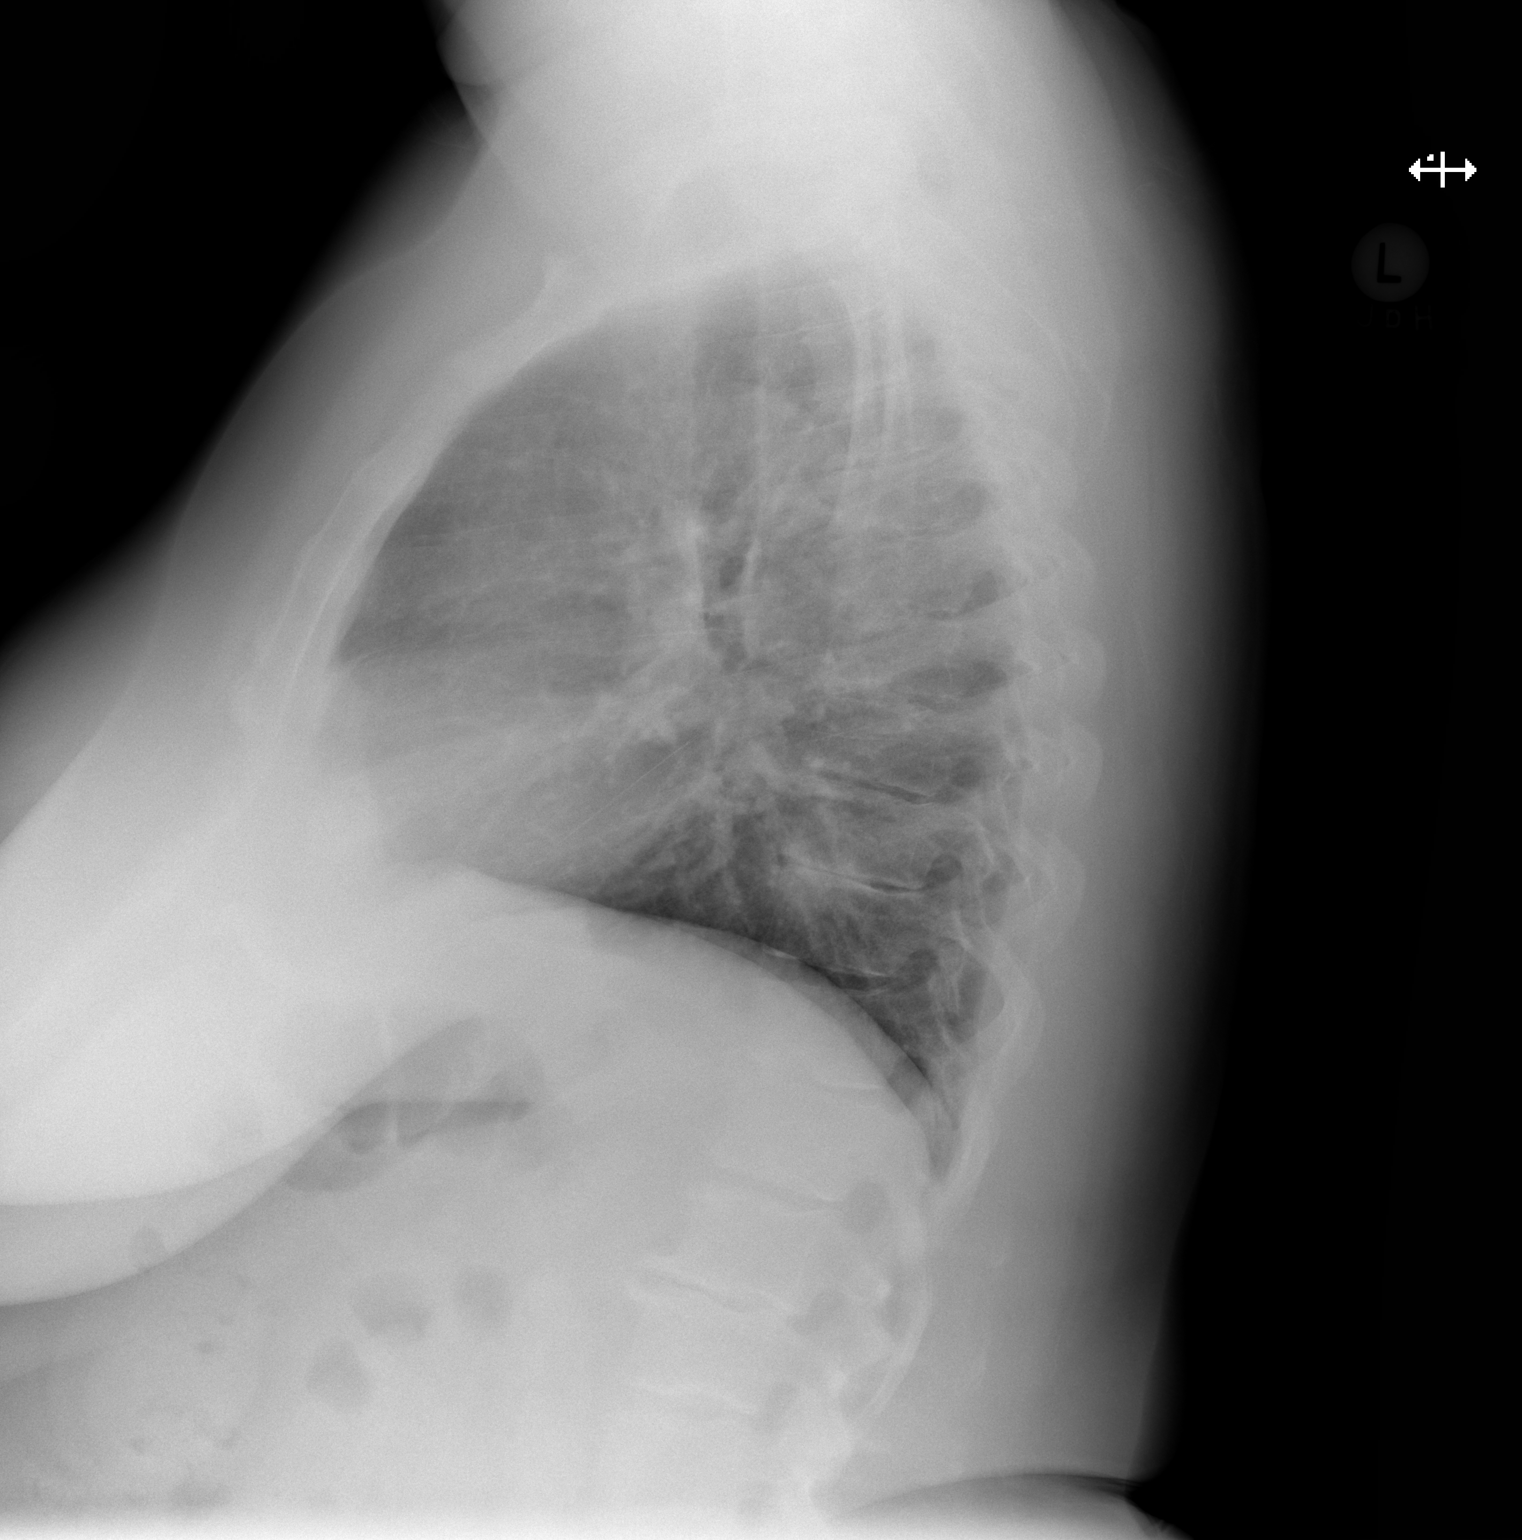

[2 of 2 positions shown; findings below may reference images not displayed]

FINDINGS: The heart size and mediastinal contours are within normal limits.
Both lungs are clear. No pneumothorax or pleural effusion is noted.
The visualized skeletal structures are unremarkable.
IMPRESSION: No active cardiopulmonary disease.

## 2019-10-27 ENCOUNTER — Other Ambulatory Visit: Payer: 59 | Admitting: Internal Medicine

## 2019-10-29 ENCOUNTER — Ambulatory Visit: Payer: 59 | Admitting: Internal Medicine

## 2019-11-17 DIAGNOSIS — H52223 Regular astigmatism, bilateral: Secondary | ICD-10-CM | POA: Diagnosis not present

## 2019-11-17 DIAGNOSIS — H524 Presbyopia: Secondary | ICD-10-CM | POA: Diagnosis not present

## 2019-11-17 DIAGNOSIS — H5213 Myopia, bilateral: Secondary | ICD-10-CM | POA: Diagnosis not present

## 2019-12-22 MED FILL — rOPINIRole HCL 0.5 MG TABS: 0.5 | 90 days supply | Qty: 270 | Fill #1

## 2019-12-22 MED FILL — FLUTICASONE PROP 50 MCG SPR: 50 | 30 days supply | Qty: 16 | Fill #2

## 2019-12-22 MED FILL — ATORVASTATIN 10 MG TABLET: 10 | 90 days supply | Qty: 90 | Fill #1

## 2020-01-04 DIAGNOSIS — G4733 Obstructive sleep apnea (adult) (pediatric): Secondary | ICD-10-CM | POA: Diagnosis not present

## 2020-02-04 ENCOUNTER — Ambulatory Visit: Payer: 59 | Admitting: Internal Medicine

## 2020-02-05 ENCOUNTER — Other Ambulatory Visit: Payer: Self-pay

## 2020-02-05 ENCOUNTER — Other Ambulatory Visit: Payer: Self-pay | Admitting: Internal Medicine

## 2020-02-05 ENCOUNTER — Ambulatory Visit (INDEPENDENT_AMBULATORY_CARE_PROVIDER_SITE_OTHER): Payer: 59 | Admitting: Internal Medicine

## 2020-02-05 ENCOUNTER — Encounter: Payer: Self-pay | Admitting: Internal Medicine

## 2020-02-05 VITALS — BP 122/70 | HR 90 | Ht <= 58 in | Wt 252.0 lb

## 2020-02-05 DIAGNOSIS — E119 Type 2 diabetes mellitus without complications: Secondary | ICD-10-CM

## 2020-02-05 DIAGNOSIS — E669 Obesity, unspecified: Secondary | ICD-10-CM | POA: Diagnosis not present

## 2020-02-05 LAB — POCT GLYCOSYLATED HEMOGLOBIN (HGB A1C): Hemoglobin A1C: 7 % — AB (ref 4.0–5.6)

## 2020-02-05 MED ORDER — OZEMPIC (0.25 OR 0.5 MG/DOSE) 2 MG/1.5ML ~~LOC~~ SOPN: 0.5000 mg | PEN_INJECTOR | SUBCUTANEOUS | 5 refills | Status: DC

## 2020-02-05 NOTE — Patient Instructions (Addendum)
Please start Ozempic 0.25 mg weekly in a.m. (for example on Sunday morning) x 4 weeks, then increase to 0.5 mg weekly in a.m. if no nausea or hypoglycemia.  Please return in 3-4 months with your sugar log.   PATIENT INSTRUCTIONS FOR TYPE 2 DIABETES:  DIET AND EXERCISE Diet and exercise is an important part of diabetic treatment.  We recommended aerobic exercise in the form of brisk walking (working between 40-60% of maximal aerobic capacity, similar to brisk walking) for 150 minutes per week (such as 30 minutes five days per week) along with 3 times per week performing 'resistance' training (using various gauge rubber tubes with handles) 5-10 exercises involving the major muscle groups (upper body, lower body and core) performing 10-15 repetitions (or near fatigue) each exercise. Start at half the above goal but build slowly to reach the above goals. If limited by weight, joint pain, or disability, we recommend daily walking in a swimming pool with water up to waist to reduce pressure from joints while allow for adequate exercise.    BLOOD GLUCOSES Monitoring your blood glucoses is important for continued management of your diabetes. Please check your blood glucoses 2-4 times a day: fasting, before meals and at bedtime (you can rotate these measurements - e.g. one day check before the 3 meals, the next day check before 2 of the meals and before bedtime, etc.).   HYPOGLYCEMIA (low blood sugar) Hypoglycemia is usually a reaction to not eating, exercising, or taking too much insulin/ other diabetes drugs.  Symptoms include tremors, sweating, hunger, confusion, headache, etc. Treat IMMEDIATELY with 15 grams of Carbs: . 4 glucose tablets .  cup regular juice/soda . 2 tablespoons raisins . 4 teaspoons sugar . 1 tablespoon honey Recheck blood glucose in 15 mins and repeat above if still symptomatic/blood glucose <100.  RECOMMENDATIONS TO REDUCE YOUR RISK OF DIABETIC COMPLICATIONS: * Take your  prescribed MEDICATION(S) * Follow a DIABETIC diet: Complex carbs, fiber rich foods, (monounsaturated and polyunsaturated) fats * AVOID saturated/trans fats, high fat foods, >2,300 mg salt per day. * EXERCISE at least 5 times a week for 30 minutes or preferably daily.  * DO NOT SMOKE OR DRINK more than 1 drink a day. * Check your FEET every day. Do not wear tightfitting shoes. Contact us if you develop an ulcer * See your EYE doctor once a year or more if needed * Get a FLU shot once a year * Get a PNEUMONIA vaccine once before and once after age 59 years  GOALS:  * Your Hemoglobin A1c of <7%  * fasting sugars need to be <130 * after meals sugars need to be <180 (2h after you start eating) * Your Systolic BP should be 846 or lower  * Your Diastolic BP should be 80 or lower  * Your HDL (Good Cholesterol) should be 40 or higher  * Your LDL (Bad Cholesterol) should be 100 or lower. * Your Triglycerides should be 150 or lower  * Your Urine microalbumin (kidney function) should be <30 * Your Body Mass Index should be 25 or lower    Please consider the following ways to cut down carbs and fat and increase fiber and micronutrients in your diet: - substitute whole grain for white bread or pasta - substitute brown rice for white rice - substitute 90-calorie flat bread pieces for slices of bread when possible - substitute sweet potatoes or yams for white potatoes - substitute humus for margarine - substitute tofu for cheese when possible -  substitute almond or rice milk for regular milk (would not drink soy milk daily due to concern for soy estrogen influence on breast cancer risk) - substitute dark chocolate for other sweets when possible - substitute water - can add lemon or orange slices for taste - for diet sodas (artificial sweeteners will trick your body that you can eat sweets without getting calories and will lead you to overeating and weight gain in the long run) - do not skip  breakfast or other meals (this will slow down the metabolism and will result in more weight gain over time)  - can try smoothies made from fruit and almond/rice milk in am instead of regular breakfast - can also try old-fashioned (not instant) oatmeal made with almond/rice milk in am - order the dressing on the side when eating salad at a restaurant (pour less than half of the dressing on the salad) - eat as little meat as possible - can try juicing, but should not forget that juicing will get rid of the fiber, so would alternate with eating raw veg./fruits or drinking smoothies - use as little oil as possible, even when using olive oil - can dress a salad with a mix of balsamic vinegar and lemon juice, for e.g. - use agave nectar, stevia sugar, or regular sugar rather than artificial sweateners - steam or broil/roast veggies  - snack on veggies/fruit/nuts (unsalted, preferably) when possible, rather than processed foods - reduce or eliminate aspartame in diet (it is in diet sodas, chewing gum, etc) Read the labels!  Try to read Dr. Janene Harvey book: "Program for Reversing Diabetes" for other ideas for healthy eating.

## 2020-02-05 NOTE — Progress Notes (Signed)
Patient ID: Connie Hall, female   DOB: March 05, 1966, 54 y.o.   MRN: [id number]   This visit occurred during the SARS-CoV-2 public health emergency.  Safety protocols were in place, including screening questions prior to the visit, additional usage of staff PPE, and extensive cleaning of exam room while observing appropriate contact time as indicated for disinfecting solutions.   HPI: Connie Hall is a 54 y.o.-year-old female, referred by her PCP, Dr. Renold Genta, for management of prediabetes, dx at least in 2012, without long-term complications and also obesity.  Patient recently had an HbA1c in the diabetic range and she was referred to endocrinology.  She was seen in the weight management clinic by Dr. Leafy Ro. Also tried Weight watchers, Optivia, other meal-replacement plans, w/o good results. She gained ~50 lbs in last 5 years.  She would like to get help with this.  If not successful, she is contemplating weight loss surgery, which was preceded by 2 of her sisters, with success.  Reviewed HbA1c: Lab Results  Component Value Date   HGBA1C 6.7 (H) 04/21/2019   HGBA1C 6.4 (H) 09/09/2018   HGBA1C 5.8 (H) 03/11/2018   HGBA1C 6.2 (H) 12/25/2017   HGBA1C 6.0 (H) 08/08/2017   HGBA1C 6.1 (H) 02/07/2017   HGBA1C 6.1 (H) 07/26/2016   HGBA1C 6.4 (H) 02/02/2016   HGBA1C 6.1 (H) 08/02/2015   HGBA1C 6.3 (H) 01/18/2015   HGBA1C 6.2 (H) 07/06/2014   HGBA1C 6.0 (H) 01/05/2014   HGBA1C 5.9 (H) 07/03/2013   HGBA1C 5.5 12/30/2012   HGBA1C 5.8 (H) 05/13/2012   HGBA1C 5.9 (H) 05/10/2011   She is not on any medications for her prediabetes.  Pt checks her sugars occasionally: 80-90s both in am and later in the day: Lowest sugar was 80; ? hypoglycemia awareness. Highest sugar was 130.  Glucometer: ?  Pt's meals are: Primarily normal - Breakfast: Optivia b'fast bar now; prev. Greek yoghurt and 2 boiled eggs - Lunch: fajitas, lasagna, chicken - leftovers - Dinner: same as above - home cooked -  Snacks:   - no CKD, last BUN/creatinine:  Lab Results  Component Value Date   BUN 13 04/21/2019   BUN 17 03/11/2018   CREATININE 0.82 04/21/2019   CREATININE 0.93 03/11/2018  Not on ACE inhibitor/ARB.  -+ Dyslipidemia; last set of lipids: Lab Results  Component Value Date   CHOL 151 04/21/2019   HDL 39 (L) 04/21/2019   LDLCALC 88 04/21/2019   TRIG 144 04/21/2019   CHOLHDL 3.9 04/21/2019  She is on Lipitor 10.  - last eye exam was on 11/17/2019. No DR. She has a history of retinal holes.  - no numbness and tingling in her feet.  Pt has FH of DM in father (dies of pancreatic cancer), 4 sisters.  No h/o pancreatitis or FH of MTC.  She also has a history of sleep apnea.  ROS: Constitutional: + weight gain and decreased appetite, no weight loss, no fatigue, + hot flashes, no subjective hypothermia, no nocturia Eyes: no blurry vision, no xerophthalmia ENT: no sore throat, no nodules palpated in neck, no dysphagia, no odynophagia, no hoarseness, no tinnitus, no hypoacusis Cardiovascular: no CP, no SOB, no palpitations, + leg swelling Respiratory: no cough, no SOB, no wheezing Gastrointestinal: no N, no V, no D, no C, no acid reflux Musculoskeletal: no muscle, no joint aches Skin: no rash, no hair loss Neurological: no tremors, no numbness or tingling/no dizziness/no HAs Psychiatric: no depression, no anxiety  Past Medical History:  Diagnosis Date  Back pain    Deviated nasal septum    Discoid lupus    DM (diabetes mellitus) (HCC)    Family history of pancreatic cancer    Family history of prostate cancer    History of discoid lupus erythematosus    Hyperlipidemia    Leg edema    Obesity    OSA (obstructive sleep apnea)    Prediabetes    Restless legs syndrome (RLS)    Vitamin D deficiency    Past Surgical History:  Procedure Laterality Date   ROTATOR CUFF REPAIR     left   Sebaceous cyst removal, back  03/2008   Social History    Socioeconomic History   Marital status: Single    Spouse name: Not on file   Number of children: 0   Years of education: Not on file   Highest education level: Not on file  Occupational History   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    Employer: [employer]  Tobacco Use   Smoking status: Former Smoker    Packs/day: 1.00    Years: 15.00    Pack years: 15.00    Types: Cigarettes    Quit date: 08/27/1998    Years since quitting: 21.4   Smokeless tobacco: Never Used  Substance and Sexual Activity   Alcohol use: Yes    Comment: rare   Drug use: No   Sexual activity: Not on file  Other Topics Concern   Not on file  Social History Narrative   Not on file   Social Determinants of Health   Financial Resource Strain:    Difficulty of Paying Living Expenses:   Food Insecurity:    Worried About Charity fundraiser in the Last Year:    Arboriculturist in the Last Year:   Transportation Needs:    Film/video editor (Medical):    Lack of Transportation (Non-Medical):   Physical Activity:    Days of Exercise per Week:    Minutes of Exercise per Session:   Stress:    Feeling of Stress :   Social Connections:    Frequency of Communication with Friends and Family:    Frequency of Social Gatherings with Friends and Family:    Attends Religious Services:    Active Member of Clubs or Organizations:    Attends Music therapist:    Marital Status:   Intimate Partner Violence:    Fear of Current or Ex-Partner:    Emotionally Abused:    Physically Abused:    Sexually Abused:    Current Outpatient Medications on File Prior to Visit  Medication Sig Dispense Refill   ALLEGRA-D ALLERGY & CONGESTION 60-120 MG 12 hr tablet TAKE 1 TABLET BY MOUTH TWICE A DAY 180 tablet 2   atorvastatin (LIPITOR) 10 MG tablet TAKE 1 TABLET BY MOUTH ONCE DAILY 90 tablet 1   fluticasone (FLONASE) 50 MCG/ACT nasal spray PLACE 2 SPRAYS INTO BOTH NOSTRILS  DAILY. 16 g 8   Multiple Vitamins-Minerals (MULTIVITAMIN WITH MINERALS) tablet Take 1 tablet by mouth daily.     rOPINIRole (REQUIP) 0.5 MG tablet TAKE 1 TABLET BY MOUTH EVERY AFTERNOON AND 2 TABLETS BY MOUTH AT BEDTIME 270 tablet 1   triamcinolone cream (KENALOG) 0.1 % Apply 1 application topically 3 (three) times daily. 30 g 0   vitamin C (ASCORBIC ACID) 500 MG tablet Take 500 mg by mouth as needed.      zinc gluconate 50 MG tablet Take 50 mg  by mouth as needed.      No current facility-administered medications on file prior to visit.   Allergies  Allergen Reactions   Sulfonamide Derivatives Hives   Family History  Problem Relation Age of Onset   Hypertension Father    Stroke Father    Heart disease Father    Diabetes Father    Pancreatic cancer Father 2   Prostate cancer Father        dx in his 22s; prostatectomy   Sleep apnea Father    Obesity Father    Skin cancer Mother    Hyperlipidemia Mother    Pancreatic cancer Sister 38       neuroendocrine tumor   Colon polyps Sister    Congestive Heart Failure Maternal Aunt    Dementia Maternal Uncle    Lung cancer Paternal Aunt    Heart attack Paternal Uncle    Colon polyps Sister    Colon polyps Sister    Pancreatic cancer Paternal Uncle 78   Testicular cancer Cousin        paternal first cousin dx in his 59s    PE: BP 122/70    Pulse 90    Ht 4\' 10"  (1.473 m)    Wt 252 lb (114.3 kg)    SpO2 96%    BMI 52.67 kg/m  Wt Readings from Last 3 Encounters:  02/05/20 252 lb (114.3 kg)  08/07/19 250 lb 12.8 oz (113.8 kg)  04/23/19 251 lb (113.9 kg)   Constitutional: Obese, in NAD Eyes: PERRLA, EOMI, no exophthalmos ENT: moist mucous membranes, no thyromegaly, no cervical lymphadenopathy Cardiovascular: RRR, No MRG, + nonpitting BLE edema Respiratory: CTA B Gastrointestinal: abdomen soft, NT, ND, BS+ Musculoskeletal: no deformities, strength intact in all 4 Skin: moist, warm, no  rashes Neurological: no tremor with outstretched hands, DTR normal in all 4  ASSESSMENT: 1.  Type 2 diabetes, new diagnosis  2.  Obesity class III  PLAN:  1. Patient with long-standing prediabetes, not on medications, with a recent HbA1c in the diabetic range.  At today's visit, we rechecked her HbA1c and this was 7%, in the diabetic range - we discussed about prediabetes being a reversible condition with appropriate dietary and exercise interventions.  However, with diabetes, the reversibility is less common in more dependent level very strict diet and exercise. - we reviewed her CBGs at home (checked few weeks ago), which were at goal, however, I am thinking that we are missing high blood sugar values.  An HbA1c of 7% corresponds to an average blood sugar of 154. - we discussed about improving her diet and I made specific suggestions.  I suggested a whole food plant-based diet, low in fat. - for now, we also discussed about starting Ozempic not only for diabetes control, but also for weight control (see below) - I suggested to:  Patient Instructions  Please start Ozempic 0.25 mg weekly in a.m. (for example on Sunday morning) x 4 weeks, then increase to 0.5 mg weekly in a.m. if no nausea or hypoglycemia.  Please return in 3-4 months with your sugar log.   - advised her to start checking sugars at different times of the day - check 1x a day or every other day, rotating checks - discussed about CBG targets for treatment: 80-130 mg/dL before meals and <180 mg/dL after meals; target HbA1c <7%. - given sugar log and advised how to fill it and to bring it at next appt  - given foot care handout  and explained the principles  - given instructions for hypoglycemia management "15-15 rule"  - advised for yearly eye exams  - Return to clinic in 3-4 mo with sugar log   2.  Obesity class III -Discussed about diet and I made specific changes (see patient instructions) -We will also start Ozempic at a  low dose and increase as tolerated.  Discussed about benefits and possible side effects.  Given coupon card.  Philemon Kingdom, MD PhD [facility] Endocrinology

## 2020-02-05 NOTE — Addendum Note (Signed)
Addended by: Cardell Peach I on: 02/05/2020 09:36 AM   Modules accepted: Orders

## 2020-02-23 ENCOUNTER — Telehealth: Payer: 59 | Admitting: Physician Assistant

## 2020-02-23 DIAGNOSIS — T63464A Toxic effect of venom of wasps, undetermined, initial encounter: Secondary | ICD-10-CM | POA: Diagnosis not present

## 2020-02-23 MED ORDER — CEPHALEXIN 500 MG PO CAPS: 500.0000 mg | ORAL_CAPSULE | Freq: Two times a day (BID) | ORAL | 0 refills | Status: AC

## 2020-02-23 MED ORDER — PREDNISONE 20 MG PO TABS: 40.0000 mg | ORAL_TABLET | Freq: Every day | ORAL | 0 refills | Status: AC

## 2020-02-23 MED ORDER — DIPHENHYDRAMINE HCL 25 MG PO TABS: 25.0000 mg | ORAL_TABLET | Freq: Four times a day (QID) | ORAL | 0 refills | Status: DC | PRN

## 2020-02-23 MED FILL — predniSONE 20 MG TABS: 20 | 5 days supply | Qty: 10 | Fill #0

## 2020-02-23 MED FILL — CEPHALEXIN 500 MG CAPSULE: 500 | 5 days supply | Qty: 10 | Fill #0

## 2020-02-23 NOTE — Progress Notes (Signed)
E Visit for Insect Sting  Thank you for describing the insect sting for [place].  Here is how we plan to help!  A sting that we will treat with a short course of prednisone. Please monitor your blood sugars as oral prednisone can cause spikes in blood sugar. If your blood sugars increase significantly, call your PCP or endocrinologist for further recommendations.   With the degree of pain and redness you are experiencing, I would recommend starting you on a short course of antibiotics as well.   Additionally, you can take oral benadryl 25mg  every 6 hours as needed for itching. Be aware the benadryl can cause drowsiness.   The 2 greatest risks from insect stings are allergic reaction, which can be fatal in some people and infection, which is more common and less serious.  Bees, wasps, yellow jackets, and hornets belong to a class of insects called Hymenoptera.  Most insect stings cause only minor discomfort.  Stings can happen anywhere on the body and can be painful.  Most stings are from honey bees or yellow jackets.  Fire ants can sting multiple times.  The sites of the stings are more likely to become infected.    Based on your information I have: and I have sent in prednisone 40 mg by mouth daily for 5 days to the pharmacy you selected.  Please make sure that you selected a pharmacy that is open now.   What can be used to prevent Insect Stings?   Insect repellant with at least 20% DEET.    Wearing long pants and shirts with socks and shoes.    Wear dark or drab-colored clothes rather than bright colors.    Avoid using perfumes and hair sprays; these attract insects.  HOME CARE ADVICE:  1. Stinger removal:  The stinger looks like a tiny black dot in the sting.  Use a fingernail, credit card edge, or knife-edge to scrape it off.  Don't pull it out because it squeezes out more venom.  If the stinger is below the skin surface, leave it alone.  It will be shed with normal skin  healing. 2. Use cold compresses to the area of the sting for 10-20 minutes.  You may repeat this as needed to relieve symptoms of pain and swelling. 3.  For pain relief, take acetominophen 650 mg 4-6 hours as needed or ibuprofen 400 mg every 6-8 hours as needed or naproxen 250-500 mg every 12 hours as needed. 4.  You can also use hydrocortisone cream 0.5% or 1% up to 4 times daily as needed for itching. 5.  If the sting becomes very itchy, take Benadryl 25-50 mg, follow directions on box. 6.  Wash the area 2-3 times daily with antibacterial soap and warm water. 7. Call your Doctor if:  Fever, a severe headache, or rash occur in the next 2 weeks.  Sting area begins to look infected.  Redness and swelling worsens after home treatment.  Your current symptoms become worse.    MAKE SURE YOU:   Understand these instructions.  Will watch your condition.  Will get help right away if you are not doing well or get worse.  Thank you for choosing an e-visit. Your e-visit answers were reviewed by a board certified advanced clinical practitioner to complete your personal care plan. Depending upon the condition, your plan could have included both over the counter or prescription medications. Please review your pharmacy choice. Be sure that the pharmacy you have chosen is open  so that you can pick up your prescription now.  If there is a problem you may message your provider in [place] to have the prescription routed to another pharmacy. Your safety is important to [place]. If you have drug allergies check your prescription carefully.  For the next 24 hours, you can use MyChart to ask questions about today's visit, request a non-urgent call back, or ask for a work or school excuse from your e-visit provider. You will get an email in the next two days asking about your experience. I hope that your e-visit has been valuable and will speed your recovery.  Greater than 5 minutes, yet less than 10 minutes of  time have been spent researching, coordinating, and implementing care for this patient today.

## 2020-03-11 MED FILL — OZEMPIC 0.25 OR 0.5 MG/DOSE: 2 | 28 days supply | Qty: 2 | Fill #1

## 2020-03-16 ENCOUNTER — Telehealth: Payer: Self-pay | Admitting: Internal Medicine

## 2020-03-16 ENCOUNTER — Other Ambulatory Visit: Payer: Self-pay

## 2020-03-16 ENCOUNTER — Ambulatory Visit: Payer: 59 | Admitting: Internal Medicine

## 2020-03-16 ENCOUNTER — Encounter: Payer: Self-pay | Admitting: Internal Medicine

## 2020-03-16 ENCOUNTER — Ambulatory Visit (INDEPENDENT_AMBULATORY_CARE_PROVIDER_SITE_OTHER): Payer: 59 | Admitting: Internal Medicine

## 2020-03-16 VITALS — BP 120/80 | HR 80 | Ht <= 58 in | Wt 252.0 lb

## 2020-03-16 DIAGNOSIS — R8290 Unspecified abnormal findings in urine: Secondary | ICD-10-CM

## 2020-03-16 DIAGNOSIS — R300 Dysuria: Secondary | ICD-10-CM | POA: Diagnosis not present

## 2020-03-16 DIAGNOSIS — M545 Low back pain, unspecified: Secondary | ICD-10-CM

## 2020-03-16 LAB — POCT URINALYSIS DIPSTICK
Bilirubin, UA: NEGATIVE
Blood, UA: NEGATIVE
Glucose, UA: NEGATIVE
Ketones, UA: NEGATIVE
Nitrite, UA: NEGATIVE
Protein, UA: NEGATIVE
Spec Grav, UA: 1.02 (ref 1.010–1.025)
Urobilinogen, UA: 0.2 E.U./dL
pH, UA: 6 (ref 5.0–8.0)

## 2020-03-16 MED ORDER — CIPROFLOXACIN HCL 250 MG PO TABS: 250.0000 mg | ORAL_TABLET | Freq: Two times a day (BID) | ORAL | 0 refills | Status: DC

## 2020-03-16 MED FILL — CIPROFLOXACIN HCL 250 MG TA: 250 | 7 days supply | Qty: 14 | Fill #0

## 2020-03-16 NOTE — Telephone Encounter (Signed)
After speaking with Dr Renold Genta she said to schedule nurse visit to check urine.

## 2020-03-16 NOTE — Telephone Encounter (Signed)
Connie Hall [phone]  [name] called to say that she has cloudy urine and pain down there for the last month and would like something called in or to be seen.

## 2020-03-17 LAB — URINE CULTURE
MICRO NUMBER:: 10732379
SPECIMEN QUALITY:: ADEQUATE

## 2020-03-20 ENCOUNTER — Encounter: Payer: Self-pay | Admitting: Internal Medicine

## 2020-03-20 NOTE — Progress Notes (Signed)
   Subjective:    Patient ID: Connie Hall, female    DOB: 1966/08/06, 54 y.o.   MRN: [id number]  HPI Nurse visit for one month history of dysuria.    Review of Systems     Objective:   Physical Exam  BP 120/80 pulse 80 , afebrile weight 252 pounds.  Urine specimen is cloudy.  Dipstick shows trace LE.  Culture was sent    Assessment & Plan:  Dysuria  Probable UTI  Plan: Cipro 250 mg twice daily for 7 days.  Addendum: July 24-urine culture shows growth of mixed flora suggesting possible contamination.  We will see if patient improves with Cipro for 7 days.

## 2020-03-20 NOTE — Patient Instructions (Signed)
Take Cipro 250 mg twice daily for 7 days.  Culture was sent.  Call if symptoms fail to improve.

## 2020-03-28 ENCOUNTER — Other Ambulatory Visit: Payer: Self-pay | Admitting: Internal Medicine

## 2020-03-28 MED FILL — FLUTICASONE PROP 50 MCG SPR: 50 | 30 days supply | Qty: 16 | Fill #3

## 2020-03-28 MED FILL — rOPINIRole HCL 0.5 MG TABS: 0.5 | 90 days supply | Qty: 270 | Fill #0

## 2020-03-28 MED FILL — ATORVASTATIN CALCIUM 10 MG: 10 | 90 days supply | Qty: 90 | Fill #0

## 2020-04-04 DIAGNOSIS — G4733 Obstructive sleep apnea (adult) (pediatric): Secondary | ICD-10-CM | POA: Diagnosis not present

## 2020-04-08 MED FILL — OZEMPIC 0.25 OR 0.5 MG/DOSE: 2 | 84 days supply | Qty: 5 | Fill #2

## 2020-05-11 ENCOUNTER — Telehealth: Payer: Self-pay | Admitting: Internal Medicine

## 2020-05-11 NOTE — Telephone Encounter (Signed)
She had never had done before wanted to see where she was at, will talk about it at CPE appointment.

## 2020-05-11 NOTE — Telephone Encounter (Signed)
Connie Hall [phone]  Kawehi called to see if she could get her hormone levels checked when she gets her labs on Friday.

## 2020-05-11 NOTE — Telephone Encounter (Signed)
Why?  

## 2020-05-13 ENCOUNTER — Other Ambulatory Visit: Payer: 59 | Admitting: Internal Medicine

## 2020-05-13 ENCOUNTER — Other Ambulatory Visit: Payer: Self-pay

## 2020-05-13 DIAGNOSIS — E119 Type 2 diabetes mellitus without complications: Secondary | ICD-10-CM

## 2020-05-13 DIAGNOSIS — Z1329 Encounter for screening for other suspected endocrine disorder: Secondary | ICD-10-CM

## 2020-05-13 DIAGNOSIS — Z0000 Encounter for general adult medical examination without abnormal findings: Secondary | ICD-10-CM | POA: Diagnosis not present

## 2020-05-13 DIAGNOSIS — Z6841 Body Mass Index (BMI) 40.0 and over, adult: Secondary | ICD-10-CM

## 2020-05-13 DIAGNOSIS — E559 Vitamin D deficiency, unspecified: Secondary | ICD-10-CM

## 2020-05-13 DIAGNOSIS — G4733 Obstructive sleep apnea (adult) (pediatric): Secondary | ICD-10-CM

## 2020-05-13 DIAGNOSIS — G2581 Restless legs syndrome: Secondary | ICD-10-CM

## 2020-05-13 DIAGNOSIS — N926 Irregular menstruation, unspecified: Secondary | ICD-10-CM | POA: Diagnosis not present

## 2020-05-13 DIAGNOSIS — G5600 Carpal tunnel syndrome, unspecified upper limb: Secondary | ICD-10-CM

## 2020-05-17 ENCOUNTER — Other Ambulatory Visit: Payer: Self-pay

## 2020-05-17 ENCOUNTER — Encounter: Payer: Self-pay | Admitting: Internal Medicine

## 2020-05-17 ENCOUNTER — Ambulatory Visit (INDEPENDENT_AMBULATORY_CARE_PROVIDER_SITE_OTHER): Payer: 59 | Admitting: Internal Medicine

## 2020-05-17 VITALS — BP 110/80 | HR 92 | Ht <= 58 in | Wt 249.0 lb

## 2020-05-17 DIAGNOSIS — E119 Type 2 diabetes mellitus without complications: Secondary | ICD-10-CM

## 2020-05-17 DIAGNOSIS — Z780 Asymptomatic menopausal state: Secondary | ICD-10-CM | POA: Diagnosis not present

## 2020-05-17 DIAGNOSIS — Z23 Encounter for immunization: Secondary | ICD-10-CM

## 2020-05-17 DIAGNOSIS — J309 Allergic rhinitis, unspecified: Secondary | ICD-10-CM

## 2020-05-17 DIAGNOSIS — G2581 Restless legs syndrome: Secondary | ICD-10-CM | POA: Diagnosis not present

## 2020-05-17 DIAGNOSIS — Z6841 Body Mass Index (BMI) 40.0 and over, adult: Secondary | ICD-10-CM | POA: Diagnosis not present

## 2020-05-17 DIAGNOSIS — Z0000 Encounter for general adult medical examination without abnormal findings: Secondary | ICD-10-CM

## 2020-05-17 DIAGNOSIS — G4733 Obstructive sleep apnea (adult) (pediatric): Secondary | ICD-10-CM

## 2020-05-17 DIAGNOSIS — N926 Irregular menstruation, unspecified: Secondary | ICD-10-CM | POA: Diagnosis not present

## 2020-05-17 DIAGNOSIS — R300 Dysuria: Secondary | ICD-10-CM | POA: Diagnosis not present

## 2020-05-17 LAB — POCT URINALYSIS DIPSTICK
Appearance: NEGATIVE
Bilirubin, UA: NEGATIVE
Blood, UA: NEGATIVE
Glucose, UA: NEGATIVE
Ketones, UA: NEGATIVE
Leukocytes, UA: NEGATIVE
Nitrite, UA: NEGATIVE
Odor: NEGATIVE
Protein, UA: NEGATIVE
Spec Grav, UA: 1.01 (ref 1.010–1.025)
Urobilinogen, UA: 0.2 E.U./dL
pH, UA: 6.5 (ref 5.0–8.0)

## 2020-05-17 NOTE — Progress Notes (Signed)
Subjective:    Patient ID: Connie Hall, female    DOB: Dec 10, 1965, 54 y.o.   MRN: [id number]  HPI 54 year old Female for health maintenance exam and evaluation of medical issues.  In June she saw Dr. Darnell Level [facility] for new onset diabetes and liked her very much.  Is on semaglutide (Ozempic) weekly.  She is on Lipitor 10 mg daily.  History of restless leg syndrome for which she takes Requip.  In July she had some dysuria.  Urine culture grew mixed flora but she was treated with Cipro and improved.  History of myopia.  She did go to [facility] loss clinic for few months in 2019 but did not find it very helpful.  History of obstructive sleep apnea seen by Dr. Elsworth Soho and has CPAP machine  In 2017 was seen by a cornea specialist from [facility] in [place] regarding retinal holes.  She is allergic to sulfa-causes hives.  History of plantar fasciitis, hyperlipidemia, obesity, restless leg syndrome, controlled type 2 diabetes without complication, dependent edema, deviated nasal septum, metabolic syndrome and history of vitamin D deficiency.  Social history: Single, never married.  Does not smoke.  Seldom consumes alcohol.  Has a college degree.  Has family in [place].  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  Past medical history: Left shoulder arthropathy 2009, right medial epicondylitis August 2009.  Right carpal tunnel syndrome 2009.  Had sebaceous cyst removed from her back by Dr. Zella Richer August 2009.   Has had 2 COVID-19 vaccines.  Tetanus immunization is up-to-date.  Has had Shingrix vaccine.  Gets annual flu vaccine.     Review of Systems  Respiratory: Negative.   Cardiovascular: Negative.   Gastrointestinal: Negative.   Genitourinary: Negative.   Neurological: Negative.    No menses recently.  FSH was checked and she is consistent with menopause.  She would like to know how to manage this and will be  referred to GYN.    Objective:   Physical Exam  Blood pressure 110/80.  Pulse 92 pulse oximetry 95%  Skin warm and dry.  Nodes none.  TMs are clear.  Neck is supple without JVD thyromegaly or carotid bruits.  Chest is clear to auscultation.  Cardiac exam regular rate and rhythm normal S1 and S2 without murmurs or gallops.  Abdomen soft nondistended without hepatosplenomegaly masses or tenderness.  GYN exam will be deferred to GYN physician.  Neuro: No focal deficits on brief neurological exam.  Her mood affect and judgment are normal.  Breast without masses.      Assessment & Plan:  Morbid obesity-should lose some weight now that she is on Ozempic.  She would be a candidate for gastric bypass surgery.  Would need to attend classes through [facility].  She may call them if she would like to pursue this.  BMI 52.04  Diabetes mellitus-now on Ozempic and being followed by Dr. Letta Median  History of vitamin D deficiency-now on high-dose vitamin D weekly and level is stable  Amenorrhea-FSH is consistent with menopause and she will be referred to GYN for management    Hyperlipidemia treated with statin medication-triglycerides are elevated at 174  Low HDL  History of restless leg syndrome treated with Requip  History of allergic rhinitis  Plan: Referral to GYN regarding management of menopause.  Continue current medications and follow-up in 6 months she will continue to be followed closely by endocrinology.  Have third  Covid vaccine when available.

## 2020-05-18 LAB — TEST AUTHORIZATION

## 2020-05-18 LAB — MICROALBUMIN / CREATININE URINE RATIO
Creatinine, Urine: 32 mg/dL (ref 20–275)
Microalb Creat Ratio: 9 mcg/mg creat (ref <30)
Microalb, Ur: 0.3 mg/dL

## 2020-05-18 LAB — COMPLETE METABOLIC PANEL WITH GFR
AG Ratio: 1.5 (calc) (ref 1.0–2.5)
ALT: 14 U/L (ref 6–29)
AST: 13 U/L (ref 10–35)
Albumin: 4.3 g/dL (ref 3.6–5.1)
Alkaline phosphatase (APISO): 46 U/L (ref 37–153)
BUN: 14 mg/dL (ref 7–25)
CO2: 26 mmol/L (ref 20–32)
Calcium: 9.4 mg/dL (ref 8.6–10.4)
Chloride: 103 mmol/L (ref 98–110)
Creat: 0.86 mg/dL (ref 0.50–1.05)
GFR, Est African American: 90 mL/min/{1.73_m2} (ref >=60)
GFR, Est Non African American: 78 mL/min/{1.73_m2} (ref >=60)
Globulin: 2.8 g/dL (calc) (ref 1.9–3.7)
Glucose, Bld: 110 mg/dL — ABNORMAL HIGH (ref 65–99)
Potassium: 5.2 mmol/L (ref 3.5–5.3)
Sodium: 139 mmol/L (ref 135–146)
Total Bilirubin: 0.5 mg/dL (ref 0.2–1.2)
Total Protein: 7.1 g/dL (ref 6.1–8.1)

## 2020-05-18 LAB — LIPID PANEL
Cholesterol: 163 mg/dL (ref <200)
HDL: 37 mg/dL — ABNORMAL LOW (ref >=50)
LDL Cholesterol (Calc): 99 mg/dL (calc)
Non-HDL Cholesterol (Calc): 126 mg/dL (calc) (ref <130)
Total CHOL/HDL Ratio: 4.4 (calc) (ref <5.0)
Triglycerides: 174 mg/dL — ABNORMAL HIGH (ref <150)

## 2020-05-18 LAB — CBC WITH DIFFERENTIAL/PLATELET
Absolute Monocytes: 400 cells/uL (ref 200–950)
Basophils Absolute: 59 cells/uL (ref 0–200)
Basophils Relative: 0.8 %
Eosinophils Absolute: 148 cells/uL (ref 15–500)
Eosinophils Relative: 2 %
HCT: 38.9 % (ref 35.0–45.0)
Hemoglobin: 12.6 g/dL (ref 11.7–15.5)
Lymphs Abs: 1769 cells/uL (ref 850–3900)
MCH: 27.8 pg (ref 27.0–33.0)
MCHC: 32.4 g/dL (ref 32.0–36.0)
MCV: 85.7 fL (ref 80.0–100.0)
MPV: 9.8 fL (ref 7.5–12.5)
Monocytes Relative: 5.4 %
Neutro Abs: 5025 cells/uL (ref 1500–7800)
Neutrophils Relative %: 67.9 %
Platelets: 314 10*3/uL (ref 140–400)
RBC: 4.54 10*6/uL (ref 3.80–5.10)
RDW: 13.5 % (ref 11.0–15.0)
Total Lymphocyte: 23.9 %
WBC: 7.4 10*3/uL (ref 3.8–10.8)

## 2020-05-18 LAB — URINE CULTURE
MICRO NUMBER:: 10976565
SPECIMEN QUALITY:: ADEQUATE

## 2020-05-18 LAB — HEMOGLOBIN A1C
Hgb A1c MFr Bld: 6.5 % of total Hgb — ABNORMAL HIGH (ref <5.7)
Mean Plasma Glucose: 140 (calc)
eAG (mmol/L): 7.7 (calc)

## 2020-05-18 LAB — VITAMIN D 25 HYDROXY (VIT D DEFICIENCY, FRACTURES): Vit D, 25-Hydroxy: 35 ng/mL (ref 30–100)

## 2020-05-18 LAB — FOLLICLE STIMULATING HORMONE: FSH: 48.7 m[IU]/mL

## 2020-05-18 LAB — TSH: TSH: 1.86 mIU/L

## 2020-05-24 ENCOUNTER — Other Ambulatory Visit: Payer: Self-pay

## 2020-05-24 ENCOUNTER — Ambulatory Visit (INDEPENDENT_AMBULATORY_CARE_PROVIDER_SITE_OTHER): Payer: 59 | Admitting: Internal Medicine

## 2020-05-24 ENCOUNTER — Encounter: Payer: Self-pay | Admitting: Internal Medicine

## 2020-05-24 VITALS — BP 138/90 | HR 85 | Ht <= 58 in | Wt 251.0 lb

## 2020-05-24 DIAGNOSIS — E119 Type 2 diabetes mellitus without complications: Secondary | ICD-10-CM

## 2020-05-24 DIAGNOSIS — E669 Obesity, unspecified: Secondary | ICD-10-CM | POA: Diagnosis not present

## 2020-05-24 MED ORDER — GLUCOSE BLOOD VI STRP: ORAL_STRIP | 3 refills | Status: DC

## 2020-05-24 MED ORDER — ONETOUCH DELICA LANCETS 33G MISC: 3 refills | Status: DC

## 2020-05-24 NOTE — Progress Notes (Addendum)
OneTouch Verio Reflect sample meter given to patient in clinic.  Lot Number [id number] X EXP date 04/26/2021

## 2020-05-24 NOTE — Progress Notes (Signed)
Patient ID: Connie Hall, female   DOB: [birth year], 54 y.o.   MRN: [id number]   This visit occurred during the SARS-CoV-2 public health emergency.  Safety protocols were in place, including screening questions prior to the visit, additional usage of staff PPE, and extensive cleaning of exam room while observing appropriate contact time as indicated for disinfecting solutions.   HPI: Connie Hall is a 54 y.o.-year-old female, referred by her PCP, Dr. Renold Genta, for management of prediabetes, dx at least in 2012, without long-term complications and also obesity.  Last visit 3 months ago.  Reviewed history: She was seen in the weight management clinic by Dr. Leafy Ro. Also tried Weight watchers, Optivia, other meal-replacement plans, w/o good results. She gained ~50 lbs in last 5 years.  She would like to get help with this.  If not successful, she is contemplating weight loss surgery, which was employed by 2 of her sisters, with success.  Reviewed HbA1c levels: Lab Results  Component Value Date   HGBA1C 6.5 (H) 05/13/2020   HGBA1C 7.0 (A) 02/05/2020   HGBA1C 6.7 (H) 04/21/2019   HGBA1C 6.4 (H) 09/09/2018   HGBA1C 5.8 (H) 03/11/2018   HGBA1C 6.2 (H) 12/25/2017   HGBA1C 6.0 (H) 08/08/2017   HGBA1C 6.1 (H) 02/07/2017   HGBA1C 6.1 (H) 07/26/2016   HGBA1C 6.4 (H) 02/02/2016   HGBA1C 6.1 (H) 08/02/2015   HGBA1C 6.3 (H) 01/18/2015   HGBA1C 6.2 (H) 07/06/2014   HGBA1C 6.0 (H) 01/05/2014   HGBA1C 5.9 (H) 07/03/2013   HGBA1C 5.5 12/30/2012   HGBA1C 5.8 (H) 05/13/2012   HGBA1C 5.9 (H) 05/10/2011   She was not on any medications for her prediabetes at last visit.  We started: - Ozempic 0.25 >> 0.5 mg weekly-she tolerates this well  Pt checks her sugars once a day: - am: 88-120 (after black coffee) - 2h after b'fast: n/c  - lunch: n/c - 2h after lunch: 100-115 - dinner: n/c - 2h after dinner: 80-100 - bedtime: n/c Lowest sugar was 80 >> 88; it is unclear at which level she has hypoglycemia  awareness. Highest sugar was 130 >> 120.  Glucometer: ?  Pt's meals are:  - Breakfast: Optivia b'fast bar now; prev. Greek yoghurt and 2 boiled eggs - Lunch: fajitas, lasagna, chicken - leftovers - Dinner: same as above - home cooked - Snacks:   -No CKD, last BUN/creatinine:  Lab Results  Component Value Date   BUN 14 05/13/2020   BUN 13 04/21/2019   CREATININE 0.86 05/13/2020   CREATININE 0.82 04/21/2019  Not on ACE inhibitor/ARB  -+ Dyslipidemia; last set of lipids: Lab Results  Component Value Date   CHOL 163 05/13/2020   HDL 37 (L) 05/13/2020   LDLCALC 99 05/13/2020   TRIG 174 (H) 05/13/2020   CHOLHDL 4.4 05/13/2020  On Lipitor 10.  - last eye exam was in 10/2019: No DR.+ history of retinal holes.  - no numbness and tingling in her feet.  Pt has FH of DM in father (dies of pancreatic cancer), 4 sisters.  No h/o pancreatitis or FH of MTC.  She has a history of sleep apnea.  ROS: Constitutional: no weight gain/weight loss, no fatigue, + hot flashes, no subjective hypothermia Eyes: no blurry vision, no xerophthalmia ENT: no sore throat, no nodules palpated in neck, no dysphagia, no odynophagia, no hoarseness Cardiovascular: no CP/no SOB/no palpitations/+ leg swelling Respiratory: no cough/no SOB/no wheezing Gastrointestinal: no N/no V/no D/no C/no acid reflux Musculoskeletal: no muscle aches/no joint aches  Skin: no rashes, no hair loss Neurological: no tremors/no numbness/no tingling/no dizziness  I reviewed pt's medications, allergies, PMH, social hx, family hx, and changes were documented in the history of present illness. Otherwise, unchanged from my initial visit note.  Past Medical History:  Diagnosis Date  . Back pain   . Deviated nasal septum   . Discoid lupus   . DM (diabetes mellitus) ([place])   . Family history of pancreatic cancer   . Family history of prostate cancer   . History of discoid lupus erythematosus   . Hyperlipidemia   . Leg edema    . Obesity   . OSA (obstructive sleep apnea)   . Prediabetes   . Restless legs syndrome (RLS)   . Vitamin D deficiency    Past Surgical History:  Procedure Laterality Date  . ROTATOR CUFF REPAIR     left  . Sebaceous cyst removal, back  03/2008   Social History   Socioeconomic History  . Marital status: Single    Spouse name: Not on file  . Number of children: 0  . Years of education: Not on file  . Highest education level: Not on file  Occupational History  . 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    Employer:   Tobacco Use  . Smoking status: Former Smoker    Packs/day: 1.00    Years: 15.00    Pack years: 15.00    Types: Cigarettes    Quit date: 08/27/1998    Years since quitting: 21.7  . Smokeless tobacco: Never Used  Substance and Sexual Activity  . Alcohol use: Yes    Comment: rare  . Drug use: No  . Sexual activity: Not on file  Other Topics Concern  . Not on file  Social History Narrative  . Not on file   Social Determinants of Health   Financial Resource Strain:   . Difficulty of Paying Living Expenses: Not on file  Food Insecurity:   . Worried About Charity fundraiser in the Last Year: Not on file  . Ran Out of Food in the Last Year: Not on file  Transportation Needs:   . Lack of Transportation (Medical): Not on file  . Lack of Transportation (Non-Medical): Not on file  Physical Activity:   . Days of Exercise per Week: Not on file  . Minutes of Exercise per Session: Not on file  Stress:   . Feeling of Stress : Not on file  Social Connections:   . Frequency of Communication with Friends and Family: Not on file  . Frequency of Social Gatherings with Friends and Family: Not on file  . Attends Religious Services: Not on file  . Active Member of Clubs or Organizations: Not on file  . Attends Archivist Meetings: Not on file  . Marital Status: Not on file  Intimate Partner Violence:   . Fear of Current or Ex-Partner: Not on file   . Emotionally Abused: Not on file  . Physically Abused: Not on file  . Sexually Abused: Not on file   Current Outpatient Medications on File Prior to Visit  Medication Sig Dispense Refill  . ALLEGRA-D ALLERGY & CONGESTION 60-120 MG 12 hr tablet TAKE 1 TABLET BY MOUTH TWICE A DAY 180 tablet 2  . atorvastatin (LIPITOR) 10 MG tablet TAKE 1 TABLET BY MOUTH ONCE DAILY 90 tablet 1  . fluticasone (FLONASE) 50 MCG/ACT nasal spray PLACE 2 SPRAYS INTO BOTH NOSTRILS DAILY. 16 g 8  .  Multiple Vitamins-Minerals (MULTIVITAMIN WITH MINERALS) tablet Take 1 tablet by mouth daily.    [name] rOPINIRole (REQUIP) 0.5 MG tablet TAKE 1 TABLET BY MOUTH EVERY AFTERNOON AND 2 TABLETS BY MOUTH AT BEDTIME 270 tablet 1  . Semaglutide,0.25 or 0.5MG /DOS, (OZEMPIC, 0.25 OR 0.5 MG/DOSE,) 2 MG/1.5ML SOPN Inject 0.375 mLs (0.5 mg total) into the skin once a week. 2 pen 5  . triamcinolone cream (KENALOG) 0.1 % Apply 1 application topically 3 (three) times daily. 30 g 0  . vitamin C (ASCORBIC ACID) 500 MG tablet Take 500 mg by mouth as needed.     . zinc gluconate 50 MG tablet Take 50 mg by mouth as needed.      No current facility-administered medications on file prior to visit.   Allergies  Allergen Reactions  . Sulfonamide Derivatives Hives   Family History  Problem Relation Age of Onset  . Hypertension Father   . Stroke Father   . Heart disease Father   . Diabetes Father   . Pancreatic cancer Father 11  . Prostate cancer Father        dx in his 38s; prostatectomy  . Sleep apnea Father   . Obesity Father   . Skin cancer Mother   . Hyperlipidemia Mother   . Pancreatic cancer Sister 92       neuroendocrine tumor  . Colon polyps Sister   . Congestive Heart Failure Maternal Aunt   . Dementia Maternal Uncle   . Lung cancer Paternal Aunt   . Heart attack Paternal Uncle   . Colon polyps Sister   . Colon polyps Sister   . Pancreatic cancer Paternal Uncle 25  . Testicular cancer Cousin        paternal first cousin  dx in his 36s    PE: BP 138/90   Pulse 85   Ht 4\' 10"  (1.473 m)   Wt 251 lb (113.9 kg)   LMP 05/04/2020   SpO2 98%   BMI 52.46 kg/m  Wt Readings from Last 3 Encounters:  05/24/20 251 lb (113.9 kg)  05/17/20 249 lb (112.9 kg)  03/16/20 252 lb (114.3 kg)   Constitutional: overweight, in NAD Eyes: PERRLA, EOMI, no exophthalmos ENT: moist mucous membranes, no thyromegaly, no cervical lymphadenopathy Cardiovascular: RRR, No MRG, + nonpitting LE edema B Respiratory: CTA B Gastrointestinal: abdomen soft, NT, ND, BS+ Musculoskeletal: no deformities, strength intact in all 4 Skin: moist, warm, no rashes Neurological: no tremor with outstretched hands, DTR normal in all 4  ASSESSMENT: 1.  Type 2 diabetes, non-insulin-dependent, recent diagnosis, without complications  2.  Obesity class III  PLAN:  1. Patient with longstanding prediabetes with recent diagnosis of diabetes in 03/2019 when an HbA1c returned 6.7%.  She had an HbA1c of 7% in 01/2020, at her last visit.  At that time, we discussed about dietary changes (I recommended a more plant-based diet) to reverse her recent diabetes and I also recommended consistent exercise.  We reviewed her CBGs at home, but she was not checking consistently.  We also started Ozempic at a low dose not only for her diabetes control but also for weight control.  She is tolerating it well, without problems. -At today's visit, sugars are excellent, decreasing as the day goes by, as expected on Ozempic.  In fact, after lunch and dinner, they are still low, that I would not suggest increasing the dose of Ozempic for now.  She is inquiring about this due to the lack of significant weight loss  on this dose. - I suggested to:  Patient Instructions  Please continue: - Ozempic 0.5 mg weekly   Please return in 4 months with your sugar log.    - advised to check sugars at different times of the day - 1x a day, rotating check times -given her a One Touch verio  reflect meter - advised for yearly eye exams >> she is UTD - return to clinic in 4 months  2.  Obesity class III -At last visit, we discussed about her diet and recommended specific changes.  I again emphasized the need for dietary changes to see meaningful weight loss. -continue GLP-1 receptor agonist which should also help with weight loss -We will think about increasing the dose of Ozempic at next visit, if needed, but I would not suggest for now due to the lower blood sugars.  Philemon Kingdom, MD PhD [facility] Endocrinology

## 2020-05-24 NOTE — Patient Instructions (Addendum)
Please continue 0.5 mg weekly.  Please return in 4 months with your sugar log.

## 2020-05-25 MED FILL — ONETOUCH VERIO TEST STRIP: 50 days supply | Qty: 100 | Fill #0

## 2020-05-25 MED FILL — ONETOUCH DELICA PLUS LANCET: 90 days supply | Qty: 200 | Fill #0

## 2020-05-26 NOTE — Patient Instructions (Addendum)
It was a pleasure to see you today.  We are referring you to GYN for menopause management.  Continue follow-up with endocrinologist.  Return in 6 months.  You may want to look into gastric bypass surgery through [facility]

## 2020-06-21 DIAGNOSIS — L820 Inflamed seborrheic keratosis: Secondary | ICD-10-CM | POA: Diagnosis not present

## 2020-06-21 DIAGNOSIS — L812 Freckles: Secondary | ICD-10-CM | POA: Diagnosis not present

## 2020-06-21 DIAGNOSIS — L821 Other seborrheic keratosis: Secondary | ICD-10-CM | POA: Diagnosis not present

## 2020-06-21 DIAGNOSIS — L918 Other hypertrophic disorders of the skin: Secondary | ICD-10-CM | POA: Diagnosis not present

## 2020-06-21 DIAGNOSIS — L905 Scar conditions and fibrosis of skin: Secondary | ICD-10-CM | POA: Diagnosis not present

## 2020-06-21 DIAGNOSIS — D2271 Melanocytic nevi of right lower limb, including hip: Secondary | ICD-10-CM | POA: Diagnosis not present

## 2020-06-22 MED FILL — FLUTICASONE PROP 50 MCG SPR: 50 | 90 days supply | Qty: 48 | Fill #4

## 2020-06-22 MED FILL — ATORVASTATIN CALCIUM 10 MG: 10 | 90 days supply | Qty: 90 | Fill #1

## 2020-06-22 MED FILL — rOPINIRole HCL 0.5 MG TABS: 0.5 | 90 days supply | Qty: 270 | Fill #1

## 2020-06-22 MED FILL — OZEMPIC 0.25 OR 0.5 MG/DOSE: 2 | 84 days supply | Qty: 5 | Fill #0

## 2020-07-04 DIAGNOSIS — G4733 Obstructive sleep apnea (adult) (pediatric): Secondary | ICD-10-CM | POA: Diagnosis not present

## 2020-08-02 ENCOUNTER — Other Ambulatory Visit: Payer: Self-pay

## 2020-08-04 ENCOUNTER — Encounter: Payer: Self-pay | Admitting: Internal Medicine

## 2020-09-16 ENCOUNTER — Other Ambulatory Visit: Payer: Self-pay | Admitting: Internal Medicine

## 2020-09-16 DIAGNOSIS — J00 Acute nasopharyngitis [common cold]: Secondary | ICD-10-CM

## 2020-09-16 MED FILL — OZEMPIC 0.25 OR 0.5 MG/DOSE: 2 | 84 days supply | Qty: 5 | Fill #1

## 2020-09-17 ENCOUNTER — Other Ambulatory Visit: Payer: Self-pay | Admitting: Internal Medicine

## 2020-09-17 MED FILL — ROPINIROLE HCL 0.5 MG TABS: 0.5 | 90 days supply | Qty: 270 | Fill #0

## 2020-09-17 MED FILL — FLUTICASONE PROP 50 MCG SPR: 50 | 90 days supply | Qty: 48 | Fill #0

## 2020-09-17 MED FILL — ATORVASTATIN CALCIUM 10 MG: 10 | 90 days supply | Qty: 90 | Fill #0

## 2020-09-27 ENCOUNTER — Encounter: Payer: Self-pay | Admitting: Internal Medicine

## 2020-09-27 ENCOUNTER — Other Ambulatory Visit: Payer: Self-pay

## 2020-09-27 ENCOUNTER — Ambulatory Visit (INDEPENDENT_AMBULATORY_CARE_PROVIDER_SITE_OTHER): Payer: 59 | Admitting: Internal Medicine

## 2020-09-27 ENCOUNTER — Other Ambulatory Visit: Payer: Self-pay | Admitting: Internal Medicine

## 2020-09-27 VITALS — BP 120/80 | HR 91 | Ht <= 58 in | Wt 256.2 lb

## 2020-09-27 DIAGNOSIS — E785 Hyperlipidemia, unspecified: Secondary | ICD-10-CM

## 2020-09-27 DIAGNOSIS — E1165 Type 2 diabetes mellitus with hyperglycemia: Secondary | ICD-10-CM

## 2020-09-27 DIAGNOSIS — E669 Obesity, unspecified: Secondary | ICD-10-CM | POA: Diagnosis not present

## 2020-09-27 LAB — POCT GLYCOSYLATED HEMOGLOBIN (HGB A1C): Hemoglobin A1C: 7.1 % — AB (ref 4.0–5.6)

## 2020-09-27 MED ORDER — METFORMIN HCL 1000 MG PO TABS: 1000.0000 mg | ORAL_TABLET | Freq: Every day | ORAL | 3 refills | Status: DC

## 2020-09-27 MED ORDER — OZEMPIC (0.25 OR 0.5 MG/DOSE) 2 MG/1.5ML ~~LOC~~ SOPN
0.5000 mg | PEN_INJECTOR | SUBCUTANEOUS | 3 refills | Status: DC
Start: 2020-09-27 — End: 2020-12-08

## 2020-09-27 MED FILL — METFORMIN HCL 1000 MG TABS: 1000 | 90 days supply | Qty: 90 | Fill #0

## 2020-09-27 NOTE — Addendum Note (Signed)
Addended by: Lauralyn Primes on: 09/27/2020 05:17 PM   Modules accepted: Orders

## 2020-09-27 NOTE — Patient Instructions (Addendum)
Please continue: - Ozempic 0.5 mg weekly   Please start: - Metformin 500 mg with dinner x4 days, then increase to 1000 mg with dinner  Please return in 4 months with your sugar log.

## 2020-09-27 NOTE — Progress Notes (Signed)
Patient ID: Connie Hall, female   DOB: 09-Jul-1966, 55 y.o.   MRN: [id number]   This visit occurred during the SARS-CoV-2 public health emergency.  Safety protocols were in place, including screening questions prior to the visit, additional usage of staff PPE, and extensive cleaning of exam room while observing appropriate contact time as indicated for disinfecting solutions.   HPI: Connie Hall is a 55 y.o.-year-old female, referred by her PCP, Dr. Renold Hall, for management of prediabetes, dx at least in 2012, without long-term complications and also obesity.  Last visit 4 months ago.  Reviewed history: She was seen in the weight management clinic by Dr. Leafy Hall. Also tried Weight watchers, Optivia, other meal-replacement plans, w/o good results. She gained ~50 lbs in last 5 years.  She would like to get help with this.  If not successful, she is contemplating weight loss surgery, which was employed by 2 of her sisters, with success.  Reviewed HbA1c levels: Lab Results  Component Value Date   HGBA1C 6.5 (H) 05/13/2020   HGBA1C 7.0 (A) 02/05/2020   HGBA1C 6.7 (H) 04/21/2019   HGBA1C 6.4 (H) 09/09/2018   HGBA1C 5.8 (H) 03/11/2018   HGBA1C 6.2 (H) 12/25/2017   HGBA1C 6.0 (H) 08/08/2017   HGBA1C 6.1 (H) 02/07/2017   HGBA1C 6.1 (H) 07/26/2016   HGBA1C 6.4 (H) 02/02/2016   HGBA1C 6.1 (H) 08/02/2015   HGBA1C 6.3 (H) 01/18/2015   HGBA1C 6.2 (H) 07/06/2014   HGBA1C 6.0 (H) 01/05/2014   HGBA1C 5.9 (H) 07/03/2013   HGBA1C 5.5 12/30/2012   HGBA1C 5.8 (H) 05/13/2012   HGBA1C 5.9 (H) 05/10/2011   She is on: - Ozempic 0.25 >> 0.5 mg weekly-she tolerates this well  Pt checks her sugars once a day: - am: 88-120 (after black coffee) >> 90-130 (cookie) - 2h after b'fast: n/c  - lunch: n/c - 2h after lunch: 100-115 >> 70-90 - dinner: n/c - 2h after dinner: 80-100 >> up to 160 - bedtime: n/c Lowest sugar was 80 >> 88 >> 70; it is unclear at which level she has hypoglycemia awareness. Highest  sugar was 130 >> 120 >> 160  Glucometer: ?  Pt's meals are:  - Breakfast: Optivia b'fast bar now; prev. Greek yoghurt and 2 boiled eggs - Lunch: fajitas, lasagna, chicken - leftovers >> protein shake + egg + yoghurt + mozarella - Dinner: same as above - home cooked - Snacks: "protein"  -No CKD, last BUN/creatinine:  Lab Results  Component Value Date   BUN 14 05/13/2020   BUN 13 04/21/2019   CREATININE 0.86 05/13/2020   CREATININE 0.82 04/21/2019  She is not on ACE inhibitor or ARB.  -+ Dyslipidemia; last set of lipids: Lab Results  Component Value Date   CHOL 163 05/13/2020   HDL 37 (L) 05/13/2020   LDLCALC 99 05/13/2020   TRIG 174 (H) 05/13/2020   CHOLHDL 4.4 05/13/2020  On Lipitor 10.  - last eye exam was in 10/2019: No DR.+ history of retinal holes.  - she denies numbness and tingling in her feet.  Pt has FH of DM in father (dies of pancreatic cancer), 4 sisters.  No h/o pancreatitis or FH of MTC.  She has a history of OSA.  ROS: Constitutional: + weight gain/no weight loss, no fatigue, + hot flashes, no subjective hypothermia Eyes: no blurry vision, no xerophthalmia ENT: no sore throat, no nodules palpated in neck, no dysphagia, no odynophagia, no hoarseness Cardiovascular: no CP/no SOB/no palpitations/+ leg swelling Respiratory: no cough/no SOB/no  wheezing Gastrointestinal: no N/no V/no D/no C/no acid reflux Musculoskeletal: no muscle aches/no joint aches Skin: no rashes, no hair loss Neurological: no tremors/no numbness/no tingling/no dizziness  I reviewed pt's medications, allergies, PMH, social hx, family hx, and changes were documented in the history of present illness. Otherwise, unchanged from my initial visit note.  Past Medical History:  Diagnosis Date  . Back pain   . Deviated nasal septum   . Discoid lupus   . DM (diabetes mellitus) ([place])   . Family history of pancreatic cancer   . Family history of prostate cancer   . History of discoid  lupus erythematosus   . Hyperlipidemia   . Leg edema   . Obesity   . OSA (obstructive sleep apnea)   . Prediabetes   . Restless legs syndrome (RLS)   . Vitamin D deficiency    Past Surgical History:  Procedure Laterality Date  . ROTATOR CUFF REPAIR     left  . Sebaceous cyst removal, back  03/2008   Social History   Socioeconomic History  . Marital status: Single    Spouse name: Not on file  . Number of children: 0  . Years of education: Not on file  . Highest education level: Not on file  Occupational History  . 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    Employer: [employer]  Tobacco Use  . Smoking status: Former Smoker    Packs/day: 1.00    Years: 15.00    Pack years: 15.00    Types: Cigarettes    Quit date: 08/27/1998    Years since quitting: 22.1  . Smokeless tobacco: Never Used  Substance and Sexual Activity  . Alcohol use: Yes    Comment: rare  . Drug use: No  . Sexual activity: Not on file  Other Topics Concern  . Not on file  Social History Narrative  . Not on file   Social Determinants of Health   Financial Resource Strain: Not on file  Food Insecurity: Not on file  Transportation Needs: Not on file  Physical Activity: Not on file  Stress: Not on file  Social Connections: Not on file  Intimate Partner Violence: Not on file   Current Outpatient Medications on File Prior to Visit  Medication Sig Dispense Refill  . ALLEGRA-D ALLERGY & CONGESTION 60-120 MG 12 hr tablet TAKE 1 TABLET BY MOUTH TWICE A DAY 180 tablet 2  . atorvastatin (LIPITOR) 10 MG tablet TAKE 1 TABLET BY MOUTH ONCE DAILY 90 tablet 1  . fluticasone (FLONASE) 50 MCG/ACT nasal spray PLACE 2 SPRAYS INTO BOTH NOSTRILS DAILY. 48 g 8  . glucose blood test strip Use as instructed 2x a day - for One Touch Verio Reflect 200 each 3  . Multiple Vitamins-Minerals (MULTIVITAMIN WITH MINERALS) tablet Take 1 tablet by mouth daily.    Connie Hall [id number] MISC Use 2x a day - for One Touch Verio  Reflect 200 each 3  . rOPINIRole (REQUIP) 0.5 MG tablet TAKE 1 TABLET BY MOUTH EVERY AFTERNOON AND 2 TABLETS BY MOUTH AT BEDTIME 270 tablet 1  . Semaglutide,0.25 or 0.5MG /DOS, (OZEMPIC, 0.25 OR 0.5 MG/DOSE,) 2 MG/1.5ML SOPN Inject 0.375 mLs (0.5 mg total) into the skin once a week. 2 pen 5  . triamcinolone cream (KENALOG) 0.1 % Apply 1 application topically 3 (three) times daily. 30 g 0  . vitamin C (ASCORBIC ACID) 500 MG tablet Take 500 mg by mouth as needed.     . zinc gluconate  50 MG tablet Take 50 mg by mouth as needed.      No current facility-administered medications on file prior to visit.   Allergies  Allergen Reactions  . Sulfonamide Derivatives Hives   Family History  Problem Relation Age of Onset  . Hypertension Father   . Stroke Father   . Heart disease Father   . Diabetes Father   . Pancreatic cancer Father 85  . Prostate cancer Father        dx in his 48s; prostatectomy  . Sleep apnea Father   . Obesity Father   . Skin cancer Mother   . Hyperlipidemia Mother   . Pancreatic cancer Sister 45       neuroendocrine tumor  . Colon polyps Sister   . Congestive Heart Failure Maternal Aunt   . Dementia Maternal Uncle   . Lung cancer Paternal Aunt   . Heart attack Paternal Uncle   . Colon polyps Sister   . Colon polyps Sister   . Pancreatic cancer Paternal Uncle 52  . Testicular cancer Cousin        paternal first cousin dx in his 32s    PE: BP 120/80   Pulse 91   Ht 4\' 10"  (1.473 m)   Wt 256 lb 3.2 oz (116.2 kg)   SpO2 97%   BMI 53.55 kg/m  Wt Readings from Last 3 Encounters:  09/27/20 256 lb 3.2 oz (116.2 kg)  05/24/20 251 lb (113.9 kg)  05/17/20 249 lb (112.9 kg)   Constitutional: overweight, in NAD Eyes: PERRLA, EOMI, no exophthalmos ENT: moist mucous membranes, no thyromegaly, no cervical lymphadenopathy Cardiovascular: RRR, No MRG, + nonpitting LE edema B Respiratory: CTA B Gastrointestinal: abdomen soft, NT, ND, BS+ Musculoskeletal: no  deformities, strength intact in all 4 Skin: moist, warm, no rashes Neurological: no tremor with outstretched hands, DTR normal in all 4  ASSESSMENT: 1.  Type 2 diabetes, non-insulin-dependent, recent diagnosis, without complications  2.  Obesity class III  3. HL  PLAN:  1. Patient with longstanding prediabetes, but with more recent diagnosis of diabetes after an HbA1c returned 6.7% in 03/2019 and then 7% 01/2020.  We started her on weekly GLP-1 receptor agonist not only for diabetes control but also to help with weight loss and she has excellent sugars on this regimen.  At last visit HbA1c was 6.5%. -At last visit we gave her a Verio Reflect complex meter and advised her to check her blood sugars daily, rotating check times -at today's visit, her sugars are slightly higher in am and also after dinner, while still being at goal, lower, before dinner.  She does not feel that Ozempic is cutting down her appetite significantly anymore.  However, upon questioning, she is only eating "protein" throughout the day, without any fiber and also with little carbs.  We discussed identifying animal protein diet is conducive to increase insulin resistance, worsening metabolic syndrome, and increased weight.  I strongly suggested to switch from an animal protein diet to a more fiber, plant-based diet.  I gave her specific examples of healthier meals.  She is interested in the diet as she likes vegetables, grains, and fruit. -She also mentions that she would be interested to try Metformin to see if this helps more with decreasing her appetite.  We discussed about benefits and possible side effects and we can definitely start this at a low dose with dinner. - I suggested to:  Patient Instructions  Please continue: - Ozempic 0.5 mg weekly  Please start: - Metformin 500 mg with dinner x4 days, then increase to 1000 mg with dinner  Please return in 4 months with your sugar log.   - we checked her HbA1c: 7.1%  (higher) - advised to check sugars at different times of the day - 1x a day, rotating check times - advised for yearly eye exams >> she is UTD - return to clinic in 4 months  2.  Obesity class III -We will continue Ozempic which should also help with weight loss -Weight was stable at last visit -gained 5 lbs since last OV -Discussed about improving diet.  Also started Metformin which may also help with decreasing appetite.  3. HL - Reviewed latest lipid panel from 04/2020: LDL at goal, triglycerides slightly high, HDL slightly low Lab Results  Component Value Date   CHOL 163 05/13/2020   HDL 37 (L) 05/13/2020   LDLCALC 99 05/13/2020   TRIG 174 (H) 05/13/2020   CHOLHDL 4.4 05/13/2020  - Continues Lipitor 10 without side effects.   Philemon Kingdom, MD PhD [facility]

## 2020-10-03 DIAGNOSIS — G4733 Obstructive sleep apnea (adult) (pediatric): Secondary | ICD-10-CM | POA: Diagnosis not present

## 2020-11-15 ENCOUNTER — Other Ambulatory Visit (HOSPITAL_BASED_OUTPATIENT_CLINIC_OR_DEPARTMENT_OTHER): Payer: Self-pay

## 2020-11-18 ENCOUNTER — Other Ambulatory Visit (HOSPITAL_BASED_OUTPATIENT_CLINIC_OR_DEPARTMENT_OTHER): Payer: Self-pay

## 2020-11-29 DIAGNOSIS — H5213 Myopia, bilateral: Secondary | ICD-10-CM | POA: Diagnosis not present

## 2020-11-29 DIAGNOSIS — H524 Presbyopia: Secondary | ICD-10-CM | POA: Diagnosis not present

## 2020-11-29 DIAGNOSIS — H52223 Regular astigmatism, bilateral: Secondary | ICD-10-CM | POA: Diagnosis not present

## 2020-11-29 DIAGNOSIS — H18593 Other hereditary corneal dystrophies, bilateral: Secondary | ICD-10-CM | POA: Diagnosis not present

## 2020-11-29 LAB — HM DIABETES EYE EXAM

## 2020-11-30 ENCOUNTER — Encounter: Payer: Self-pay | Admitting: Internal Medicine

## 2020-12-07 ENCOUNTER — Other Ambulatory Visit (HOSPITAL_COMMUNITY): Payer: Self-pay

## 2020-12-07 ENCOUNTER — Telehealth: Payer: Self-pay | Admitting: Internal Medicine

## 2020-12-07 MED FILL — Ropinirole Hydrochloride Tab 0.5 MG: ORAL | 90 days supply | Qty: 270 | Fill #0 | Status: AC

## 2020-12-07 MED FILL — Fluticasone Propionate Nasal Susp 50 MCG/ACT: NASAL | 90 days supply | Qty: 48 | Fill #0 | Status: AC

## 2020-12-07 MED FILL — Atorvastatin Calcium Tab 10 MG (Base Equivalent): ORAL | 90 days supply | Qty: 90 | Fill #0 | Status: AC

## 2020-12-07 MED FILL — Metformin HCl Tab 1000 MG: ORAL | 90 days supply | Qty: 90 | Fill #0 | Status: CN

## 2020-12-07 NOTE — Telephone Encounter (Signed)
Patient needs Ozempic & Metformin resent to [facility]  - resend request is because pharmacy computers were updated and RX did not all transfer over

## 2020-12-08 ENCOUNTER — Other Ambulatory Visit: Payer: Self-pay

## 2020-12-08 MED ORDER — METFORMIN HCL 1000 MG PO TABS
ORAL_TABLET | Freq: Every day | ORAL | 3 refills | Status: DC
  Filled 2020-12-08: qty 90, 90d supply, fill #0
  Filled 2021-03-13: qty 90, 90d supply, fill #1
  Filled 2021-06-12: qty 90, 90d supply, fill #2
  Filled 2021-09-18: qty 90, 90d supply, fill #3

## 2020-12-08 MED ORDER — OZEMPIC (0.25 OR 0.5 MG/DOSE) 2 MG/1.5ML ~~LOC~~ SOPN: 0.5000 mg | PEN_INJECTOR | SUBCUTANEOUS | 3 refills | Status: DC

## 2020-12-08 NOTE — Telephone Encounter (Signed)
Rx sent 

## 2020-12-09 ENCOUNTER — Other Ambulatory Visit: Payer: Self-pay | Admitting: Internal Medicine

## 2020-12-09 ENCOUNTER — Other Ambulatory Visit (HOSPITAL_COMMUNITY): Payer: Self-pay

## 2020-12-09 MED ORDER — OZEMPIC (0.25 OR 0.5 MG/DOSE) 2 MG/1.5ML ~~LOC~~ SOPN
PEN_INJECTOR | SUBCUTANEOUS | 3 refills | Status: DC
Start: 2020-12-09 — End: 2021-01-26
  Filled 2020-12-09: qty 4.5, 84d supply, fill #0

## 2020-12-10 ENCOUNTER — Other Ambulatory Visit (HOSPITAL_COMMUNITY): Payer: Self-pay

## 2020-12-12 ENCOUNTER — Other Ambulatory Visit (HOSPITAL_COMMUNITY): Payer: Self-pay

## 2020-12-21 ENCOUNTER — Encounter: Payer: Self-pay | Admitting: Internal Medicine

## 2020-12-22 ENCOUNTER — Other Ambulatory Visit: Payer: Self-pay | Admitting: Internal Medicine

## 2020-12-22 DIAGNOSIS — E1165 Type 2 diabetes mellitus with hyperglycemia: Secondary | ICD-10-CM

## 2020-12-22 DIAGNOSIS — E669 Obesity, unspecified: Secondary | ICD-10-CM

## 2021-01-02 DIAGNOSIS — G4733 Obstructive sleep apnea (adult) (pediatric): Secondary | ICD-10-CM | POA: Diagnosis not present

## 2021-01-12 DIAGNOSIS — G4733 Obstructive sleep apnea (adult) (pediatric): Secondary | ICD-10-CM | POA: Diagnosis not present

## 2021-01-12 DIAGNOSIS — E119 Type 2 diabetes mellitus without complications: Secondary | ICD-10-CM | POA: Diagnosis not present

## 2021-01-12 DIAGNOSIS — E785 Hyperlipidemia, unspecified: Secondary | ICD-10-CM | POA: Diagnosis not present

## 2021-01-16 ENCOUNTER — Other Ambulatory Visit: Payer: Self-pay | Admitting: Surgery

## 2021-01-16 ENCOUNTER — Other Ambulatory Visit (HOSPITAL_COMMUNITY): Payer: Self-pay | Admitting: Surgery

## 2021-01-25 ENCOUNTER — Ambulatory Visit (HOSPITAL_COMMUNITY): Payer: 59

## 2021-01-25 ENCOUNTER — Encounter (HOSPITAL_COMMUNITY): Payer: Self-pay

## 2021-01-26 ENCOUNTER — Other Ambulatory Visit: Payer: Self-pay

## 2021-01-26 ENCOUNTER — Encounter: Payer: Self-pay | Admitting: Internal Medicine

## 2021-01-26 ENCOUNTER — Ambulatory Visit: Payer: 59 | Admitting: Internal Medicine

## 2021-01-26 ENCOUNTER — Other Ambulatory Visit (HOSPITAL_COMMUNITY): Payer: Self-pay

## 2021-01-26 VITALS — BP 128/82 | HR 83 | Ht <= 58 in | Wt 253.4 lb

## 2021-01-26 DIAGNOSIS — E1165 Type 2 diabetes mellitus with hyperglycemia: Secondary | ICD-10-CM | POA: Diagnosis not present

## 2021-01-26 DIAGNOSIS — E785 Hyperlipidemia, unspecified: Secondary | ICD-10-CM | POA: Diagnosis not present

## 2021-01-26 DIAGNOSIS — E669 Obesity, unspecified: Secondary | ICD-10-CM

## 2021-01-26 LAB — POCT GLYCOSYLATED HEMOGLOBIN (HGB A1C): Hemoglobin A1C: 6.2 % — AB (ref 4.0–5.6)

## 2021-01-26 MED ORDER — OZEMPIC (1 MG/DOSE) 4 MG/3ML ~~LOC~~ SOPN
1.0000 mg | PEN_INJECTOR | SUBCUTANEOUS | 3 refills | Status: DC
  Filled 2021-01-26: qty 9, 84d supply, fill #0
  Filled 2021-06-12: qty 3, 28d supply, fill #1
  Filled 2021-07-25: qty 3, 28d supply, fill #2
  Filled 2021-08-28: qty 3, 28d supply, fill #3
  Filled 2021-09-18: qty 3, 28d supply, fill #4
  Filled 2021-10-24: qty 3, 28d supply, fill #5
  Filled 2021-11-30: qty 3, 28d supply, fill #6
  Filled 2022-01-15: qty 9, 84d supply, fill #7

## 2021-01-26 NOTE — Patient Instructions (Addendum)
Please continue: - Metformin 1000 mg with dinner  You can increase: - Ozempic 1 mg weekly   Please return in 6 months with your sugar log.

## 2021-01-26 NOTE — Progress Notes (Signed)
Patient ID: Connie Hall, female   DOB: October 17, 1965, 55 y.o.   MRN: [id number]   This visit occurred during the SARS-CoV-2 public health emergency.  Safety protocols were in place, including screening questions prior to the visit, additional usage of staff PPE, and extensive cleaning of exam room while observing appropriate contact time as indicated for disinfecting solutions.   HPI: Connie Hall is a 55 y.o.-year-old female, referred by her PCP, Dr. Renold Genta, for management of DM2, dx at least in 2012, without long-term complications and also obesity.  Last visit 4 months ago.  Interim history: She is trying to get gastric sleeve surgery with Dr. Thermon Leyland ([facility]). Plan to have Sx towards the end of the year. No increased urination, blurry vision, nausea, chest pain.  Reviewed history: She was seen in the weight management clinic by Dr. Leafy Ro. Also tried Weight watchers, Optivia, other meal-replacement plans, w/o good results. She gained ~50 lbs in last 5 years.  She would like to get help with this.  If not successful, she is contemplating weight loss surgery, which was employed by 2 of her sisters, with success.  Reviewed HbA1c levels: Lab Results  Component Value Date   HGBA1C 7.1 (A) 09/27/2020   HGBA1C 6.5 (H) 05/13/2020   HGBA1C 7.0 (A) 02/05/2020   HGBA1C 6.7 (H) 04/21/2019   HGBA1C 6.4 (H) 09/09/2018   HGBA1C 5.8 (H) 03/11/2018   HGBA1C 6.2 (H) 12/25/2017   HGBA1C 6.0 (H) 08/08/2017   HGBA1C 6.1 (H) 02/07/2017   HGBA1C 6.1 (H) 07/26/2016   HGBA1C 6.4 (H) 02/02/2016   HGBA1C 6.1 (H) 08/02/2015   HGBA1C 6.3 (H) 01/18/2015   HGBA1C 6.2 (H) 07/06/2014   HGBA1C 6.0 (H) 01/05/2014   HGBA1C 5.9 (H) 07/03/2013   HGBA1C 5.5 12/30/2012   HGBA1C 5.8 (H) 05/13/2012   HGBA1C 5.9 (H) 05/10/2011   She is on: - Ozempic 0.25 >> 0.5 mg weekly-she tolerates this well - Metformin 500 >> 1000 mg with dinner-added 09/2020  Pt checks her sugars once a day: - am: 88-120 (after black  coffee) >> 90-130 (cookie) >> 80-100 - 2h after b'fast: n/c  - lunch: n/c - 2h after lunch: 100-115 >> 70-90 >> 110-115 - dinner: n/c - 2h after dinner: 80-100 >> up to 160 >> < or =160 - bedtime: n/c Lowest sugar was 80 >> 88 >> 70; it is unclear at which level she has hypoglycemia awareness. Highest sugar was 130 >> 120 >> 160 >> 160.  Glucometer: ?  Pt's meals are:  - Breakfast: Optivia b'fast bar now; prev. Greek yoghurt and 2 boiled eggs - Lunch: fajitas, lasagna, chicken - leftovers >> protein shake + egg + yoghurt + mozarella - Dinner: same as above - home cooked - Snacks: "protein"  -No CKD, last BUN/creatinine:  Lab Results  Component Value Date   BUN 14 05/13/2020   BUN 13 04/21/2019   CREATININE 0.86 05/13/2020   CREATININE 0.82 04/21/2019  She is not on ACE inhibitor or ARB.  -+ Dyslipidemia; last set of lipids: Lab Results  Component Value Date   CHOL 163 05/13/2020   HDL 37 (L) 05/13/2020   LDLCALC 99 05/13/2020   TRIG 174 (H) 05/13/2020   CHOLHDL 4.4 05/13/2020  On Lipitor 10.  - last eye exam was in 11/2020: No DR.+ history of retinal holes.  - she denies numbness and tingling in her feet.  Pt has FH of DM in father (dies of pancreatic cancer), 4 sisters.  No h/o pancreatitis or  FH of MTC.  She has a history of OSA.  ROS: Constitutional: no weight gain/no weight loss, no fatigue, + hot flashes, no subjective hypothermia Eyes: no blurry vision, no xerophthalmia ENT: no sore throat, no nodules palpated in neck, no dysphagia, no odynophagia, no hoarseness Cardiovascular: no CP/no SOB/no palpitations/+ leg swelling Respiratory: no cough/no SOB/no wheezing Gastrointestinal: no N/no V/no D/no C/no acid reflux Musculoskeletal: no muscle aches/no joint aches Skin: no rashes, no hair loss Neurological: no tremors/no numbness/no tingling/no dizziness  I reviewed pt's medications, allergies, PMH, social hx, family hx, and changes were documented in the  history of present illness. Otherwise, unchanged from my initial visit note.  Past Medical History:  Diagnosis Date  . Back pain   . Deviated nasal septum   . Discoid lupus   . DM (diabetes mellitus) ([place])   . Family history of pancreatic cancer   . Family history of prostate cancer   . History of discoid lupus erythematosus   . Hyperlipidemia   . Leg edema   . Obesity   . OSA (obstructive sleep apnea)   . Prediabetes   . Restless legs syndrome (RLS)   . Vitamin D deficiency    Past Surgical History:  Procedure Laterality Date  . ROTATOR CUFF REPAIR     left  . Sebaceous cyst removal, back  03/2008   Social History   Socioeconomic History  . Marital status: Single    Spouse name: Not on file  . Number of children: 0  . Years of education: Not on file  . Highest education level: Not on file  Occupational History  . 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    Employer: [employer]  Tobacco Use  . Smoking status: Former Smoker    Packs/day: 1.00    Years: 15.00    Pack years: 15.00    Types: Cigarettes    Quit date: 08/27/1998    Years since quitting: 22.4  . Smokeless tobacco: Never Used  Substance and Sexual Activity  . Alcohol use: Yes    Comment: rare  . Drug use: No  . Sexual activity: Not on file  Other Topics Concern  . Not on file  Social History Narrative  . Not on file   Social Determinants of Health   Financial Resource Strain: Not on file  Food Insecurity: Not on file  Transportation Needs: Not on file  Physical Activity: Not on file  Stress: Not on file  Social Connections: Not on file  Intimate Partner Violence: Not on file   Current Outpatient Medications on File Prior to Visit  Medication Sig Dispense Refill  . ALLEGRA-D ALLERGY & CONGESTION 60-120 MG 12 hr tablet TAKE 1 TABLET BY MOUTH TWICE A DAY 180 tablet 2  . atorvastatin (LIPITOR) 10 MG tablet TAKE 1 TABLET BY MOUTH ONCE A DAY 90 tablet 1  . fluticasone (FLONASE) 50 MCG/ACT nasal spray  USE 2 SPRAYS IN EACH NOSTRIL ONCE A DAY 48 g 8  . glucose blood test strip Use as instructed 2x a day - for One Touch Verio Reflect 200 each 3  . metFORMIN (GLUCOPHAGE) 1000 MG tablet TAKE 1 TABLET BY MOUTH DAILY WITH SUPPER 90 tablet 3  . Multiple Vitamins-Minerals (MULTIVITAMIN WITH MINERALS) tablet Take 1 tablet by mouth daily.    Glory Rosebush Delica Lancets 22W MISC Use 2x a day - for One Touch Verio Reflect 200 each 3  . rOPINIRole (REQUIP) 0.5 MG tablet TAKE 1 TABLET BY MOUTH EVERY AFTERNOON  AND 2 TABLETS AT BEDTIME 270 tablet 1  . Semaglutide,0.25 or 0.5MG /DOS, (OZEMPIC, 0.25 OR 0.5 MG/DOSE,) 2 MG/1.5ML SOPN Inject 0.5 mg into the skin once a week. 4.5 mL 3  . Semaglutide,0.25 or 0.5MG /DOS, (OZEMPIC, 0.25 OR 0.5 MG/DOSE,) 2 MG/1.5ML SOPN INJECT 0.375 MLS (0.5 MG TOTAL) INTO THE SKIN ONCE A WEEK. 9 mL 3  . triamcinolone cream (KENALOG) 0.1 % Apply 1 application topically 3 (three) times daily. 30 g 0  . vitamin C (ASCORBIC ACID) 500 MG tablet Take 500 mg by mouth as needed.     . zinc gluconate 50 MG tablet Take 50 mg by mouth as needed.      No current facility-administered medications on file prior to visit.   Allergies  Allergen Reactions  . Sulfonamide Derivatives Hives   Family History  Problem Relation Age of Onset  . Hypertension Father   . Stroke Father   . Heart disease Father   . Diabetes Father   . Pancreatic cancer Father 23  . Prostate cancer Father        dx in his 14s; prostatectomy  . Sleep apnea Father   . Obesity Father   . Skin cancer Mother   . Hyperlipidemia Mother   . Pancreatic cancer Sister 28       neuroendocrine tumor  . Colon polyps Sister   . Congestive Heart Failure Maternal Aunt   . Dementia Maternal Uncle   . Lung cancer Paternal Aunt   . Heart attack Paternal Uncle   . Colon polyps Sister   . Colon polyps Sister   . Pancreatic cancer Paternal Uncle 48  . Testicular cancer Cousin        paternal first cousin dx in his 1s    PE: BP  128/82 (BP Location: Left Arm, Patient Position: Sitting, Cuff Size: Normal)   Pulse 83   Ht 4\' 10"  (1.473 m)   Wt 253 lb 6.4 oz (114.9 kg)   SpO2 96%   BMI 52.96 kg/m  Wt Readings from Last 3 Encounters:  01/26/21 253 lb 6.4 oz (114.9 kg)  09/27/20 256 lb 3.2 oz (116.2 kg)  05/24/20 251 lb (113.9 kg)   Constitutional: overweight, in NAD Eyes: PERRLA, EOMI, no exophthalmos ENT: moist mucous membranes, no thyromegaly, no cervical lymphadenopathy Cardiovascular: RRR, No MRG, + pitting LE edema B Respiratory: CTA B Gastrointestinal: abdomen soft, NT, ND, BS+ Musculoskeletal: no deformities, strength intact in all 4 Skin: moist, warm, no rashes Neurological: no tremor with outstretched hands, DTR normal in all 4  ASSESSMENT: 1.  Type 2 diabetes, non-insulin-dependent, recent diagnosis, without complications  2.  Obesity class III  3. HL  PLAN:  1. Patient with low energy and prediabetes, but with more recent diagnosis of diabetes after an HbA1c returned 6.7% in 03/2019 and then 7% in 01/2020.  We started her on weekly GLP-1 receptor agonist not only for diabetes control but also to help with weight loss.  She had excellent blood sugars on this regimen.  Latest HbA1c was, however, higher, in 09/2020, at 7.1%, increased from 6.5% in 04/2020. -At last visit, sugars are slightly higher in a.m. and also after dinner, while being at goal, lower, before dinner.  At last visit we discussed about improving diet by reducing fatty foods including animal protein, to improve her insulin resistance.  I also suggested to increase the amount of fiber and healthy carbs into her diet.  Given her specific examples of healthier meals.  She was interested  in a more plant-based diet.  We also added metformin to see if this could suppress her appetite more. -At today's visit, sugars are excellent, at all times of the day, without hyperglycemic spikes or hypoglycemia.  She is interested in increasing the Ozempic  more for the weight loss benefits and appetite suppression.  We can do this now.  I did advise her to keep an eye on her blood sugars and see if they are decreasing too much. -She is interested in gastric sleeve and she hopes to have this done by the end of the year.  In the meantime, she is trying to improve her diet - I suggested to:  Patient Instructions  Please continue: - Metformin 1000 mg with dinner  You can increase: - Ozempic 1 mg weekly   Please return in 6 months with your sugar log.   - we checked her HbA1c: 6.2% (lower) - advised to check sugars at different times of the day - 1x a day, rotating check times - advised for yearly eye exams >> she is UTD - return to clinic in 6 months  2.  Obesity class III -We will continue Ozempic which will help with weight loss -we will increase the dose today -Will also continue metformin which is an appetite suppressant long-term. -She gained 5 pounds before last visit -Lost 3 pound since last visit  3. HL -Reviewed latest lipid panel from 04/2020: LDL at goal, triglycerides slightly high, HDL slightly low: Lab Results  Component Value Date   CHOL 163 05/13/2020   HDL 37 (L) 05/13/2020   LDLCALC 99 05/13/2020   TRIG 174 (H) 05/13/2020   CHOLHDL 4.4 05/13/2020  -Continues Lipitor 10 mg daily without side effects   Philemon Kingdom, MD PhD [facility]

## 2021-01-31 NOTE — Progress Notes (Signed)
scheduled

## 2021-03-13 ENCOUNTER — Other Ambulatory Visit (HOSPITAL_COMMUNITY): Payer: Self-pay

## 2021-03-13 ENCOUNTER — Other Ambulatory Visit: Payer: Self-pay | Admitting: Internal Medicine

## 2021-03-13 MED FILL — Fluticasone Propionate Nasal Susp 50 MCG/ACT: NASAL | 90 days supply | Qty: 48 | Fill #1 | Status: AC

## 2021-03-14 ENCOUNTER — Other Ambulatory Visit (HOSPITAL_COMMUNITY): Payer: Self-pay

## 2021-03-14 MED ORDER — ATORVASTATIN CALCIUM 10 MG PO TABS
ORAL_TABLET | Freq: Every day | ORAL | 0 refills | Status: DC
  Filled 2021-03-14: qty 90, 90d supply, fill #0

## 2021-03-14 MED ORDER — ROPINIROLE HCL 0.5 MG PO TABS
ORAL_TABLET | ORAL | 3 refills | Status: DC
  Filled 2021-03-14: qty 270, 90d supply, fill #0
  Filled 2021-06-12: qty 270, 90d supply, fill #1
  Filled 2021-09-18: qty 270, 90d supply, fill #2
  Filled 2021-11-30 (×2): qty 270, 90d supply, fill #3

## 2021-03-15 ENCOUNTER — Other Ambulatory Visit (HOSPITAL_COMMUNITY): Payer: Self-pay

## 2021-04-03 DIAGNOSIS — G4733 Obstructive sleep apnea (adult) (pediatric): Secondary | ICD-10-CM | POA: Diagnosis not present

## 2021-04-27 ENCOUNTER — Ambulatory Visit: Payer: 59 | Admitting: Internal Medicine

## 2021-04-27 ENCOUNTER — Other Ambulatory Visit (HOSPITAL_COMMUNITY): Payer: Self-pay

## 2021-04-27 ENCOUNTER — Other Ambulatory Visit: Payer: Self-pay

## 2021-04-27 ENCOUNTER — Encounter: Payer: Self-pay | Admitting: Internal Medicine

## 2021-04-27 ENCOUNTER — Telehealth: Payer: Self-pay | Admitting: Internal Medicine

## 2021-04-27 VITALS — BP 120/80 | HR 76 | Wt 230.0 lb

## 2021-04-27 DIAGNOSIS — R350 Frequency of micturition: Secondary | ICD-10-CM

## 2021-04-27 DIAGNOSIS — R3915 Urgency of urination: Secondary | ICD-10-CM

## 2021-04-27 DIAGNOSIS — R82998 Other abnormal findings in urine: Secondary | ICD-10-CM

## 2021-04-27 DIAGNOSIS — R1030 Lower abdominal pain, unspecified: Secondary | ICD-10-CM | POA: Diagnosis not present

## 2021-04-27 DIAGNOSIS — E119 Type 2 diabetes mellitus without complications: Secondary | ICD-10-CM | POA: Diagnosis not present

## 2021-04-27 LAB — POCT URINALYSIS DIPSTICK
Appearance: NEGATIVE
Bilirubin, UA: NEGATIVE
Blood, UA: NEGATIVE
Glucose, UA: NEGATIVE
Ketones, UA: NEGATIVE
Leukocytes, UA: NEGATIVE
Nitrite, UA: NEGATIVE
Odor: NEGATIVE
Protein, UA: NEGATIVE
Spec Grav, UA: 1.01 (ref 1.010–1.025)
Urobilinogen, UA: 0.2 E.U./dL
pH, UA: 6.5 (ref 5.0–8.0)

## 2021-04-27 MED ORDER — CIPROFLOXACIN HCL 500 MG PO TABS
500.0000 mg | ORAL_TABLET | Freq: Two times a day (BID) | ORAL | 1 refills | Status: DC
  Filled 2021-04-27: qty 14, 7d supply, fill #0
  Filled 2021-05-03: qty 14, 7d supply, fill #1

## 2021-04-27 MED ORDER — SCOPOLAMINE 1 MG/3DAYS TD PT72
1.0000 | MEDICATED_PATCH | TRANSDERMAL | 12 refills | Status: DC
  Filled 2021-04-27: qty 8, 24d supply, fill #0

## 2021-04-27 MED ORDER — CIPROFLOXACIN HCL 500 MG PO TABS
500.0000 mg | ORAL_TABLET | Freq: Two times a day (BID) | ORAL | 1 refills | Status: DC
  Filled 2021-04-27: qty 14, 7d supply, fill #0

## 2021-04-27 NOTE — Telephone Encounter (Signed)
Pt called and said she believes she has an UTI, when would you like to see her for this?

## 2021-04-27 NOTE — Progress Notes (Signed)
Cipro 500   Subjective:    Patient ID: Connie Hall, female    DOB: 1966-07-03, 55 y.o.   MRN: [id number]  HPI  3 week history of urinary tract symptoms.  No fever, chills, nausea or vomiting.  Main complaint is frequency.  She is concerned she might have a UTI.  She had urine specimen checked during physical exam September 2021 that proved to be abnormal but culture showed mixed flora with possible contamination.  She was not given an antibiotic.  In July 2021 she had had some dysuria.  Urine grew mixed flora and she was treated with Cipro and improved.  She is going on a cruise to [place] in the near future.  She does not want to be killed during the trip.  She is considering bariatric sleeve surgery.  She is followed by Dr. Maryellen Pile for type 2 diabetes mellitus with hyperlipidemia.  Her hemoglobin A1c has been between 6.5 and 7.1 through February 2022 from June 2021   She request dermal patches for motion sickness for her trip   Review of Systems see above     Objective:   Physical Exam Blood pressure 120/80 pulse 76 pulse oximetry 97% weight 230 pounds she is afebrile.  BMI 48.07  Skin: Warm and dry.  No CVA tenderness.  Urine dipstick is negative.  Urine microscopic shows few bacteria and many calcium oxalate crystals.  She could well have kidney stones given that she has calcium oxalate crystals.  Urine culture showed no growth on September 2.  However since she is going out of the country I thought it was important to go ahead and treat her with the symptoms.  She was given Cipro 500 mg twice daily for 7 days with 1 refill to take with her on her trip.       Assessment & Plan:  UTI-patient is concerned because she is going to [place] soon  Morbid obesity-considering bariatric surgery  Calcium oxalate crystals in urine  Controlled type 2 diabetes mellitus  For potential motion sickness on her trip to [place] have prescribed transdermal scopolamine patches  Plan: Cipro 500  mg twice daily for 7 days with 1 refill which she can take with her on her trip to [place].  Addendum: Has calcium oxalate crystals in her urine.  She should watch intake of soda and tea.  She may be at risk for kidney stones.

## 2021-04-28 ENCOUNTER — Other Ambulatory Visit (HOSPITAL_COMMUNITY): Payer: Self-pay

## 2021-04-28 LAB — URINALYSIS, MICROSCOPIC ONLY: Hyaline Cast: NONE SEEN /LPF

## 2021-04-28 LAB — URINE CULTURE
MICRO NUMBER:: 12321802
Result:: NO GROWTH
SPECIMEN QUALITY:: ADEQUATE

## 2021-05-03 ENCOUNTER — Other Ambulatory Visit (HOSPITAL_COMMUNITY): Payer: Self-pay

## 2021-05-04 DIAGNOSIS — Z1159 Encounter for screening for other viral diseases: Secondary | ICD-10-CM | POA: Diagnosis not present

## 2021-05-17 ENCOUNTER — Other Ambulatory Visit (HOSPITAL_COMMUNITY): Payer: Self-pay

## 2021-05-17 NOTE — Patient Instructions (Addendum)
Take Cipro 500 mg twice daily for 7 days with 1 refill.  This refill can be taken with you on your trip to [place].  Due to calcium oxalate crystals in your urine it is important to avoid sodas and tea and to drink plenty of water.  Transderm Scop patch given for potential motion sickness.

## 2021-05-29 ENCOUNTER — Other Ambulatory Visit (HOSPITAL_COMMUNITY): Payer: Self-pay

## 2021-06-05 ENCOUNTER — Ambulatory Visit (HOSPITAL_COMMUNITY)
Admission: RE | Admit: 2021-06-05 | Discharge: 2021-06-05 | Disposition: A | Discharge status: Home / Self Care | Payer: 59 | Source: Ambulatory Visit | Attending: Surgery | Admitting: Surgery

## 2021-06-05 DIAGNOSIS — Z01818 Encounter for other preprocedural examination: Secondary | ICD-10-CM | POA: Diagnosis not present

## 2021-06-05 DIAGNOSIS — I700 Atherosclerosis of aorta: Secondary | ICD-10-CM | POA: Diagnosis not present

## 2021-06-06 ENCOUNTER — Encounter: Payer: Self-pay | Admitting: Gastroenterology

## 2021-06-06 ENCOUNTER — Other Ambulatory Visit: Payer: Self-pay

## 2021-06-06 ENCOUNTER — Other Ambulatory Visit: Payer: 59 | Admitting: Internal Medicine

## 2021-06-06 DIAGNOSIS — E785 Hyperlipidemia, unspecified: Secondary | ICD-10-CM | POA: Diagnosis not present

## 2021-06-06 DIAGNOSIS — Z0000 Encounter for general adult medical examination without abnormal findings: Secondary | ICD-10-CM

## 2021-06-06 DIAGNOSIS — E559 Vitamin D deficiency, unspecified: Secondary | ICD-10-CM

## 2021-06-06 DIAGNOSIS — E119 Type 2 diabetes mellitus without complications: Secondary | ICD-10-CM

## 2021-06-09 LAB — MICROALBUMIN / CREATININE URINE RATIO
Creatinine, Urine: 52 mg/dL (ref 20–275)
Microalb Creat Ratio: 4 mcg/mg creat (ref <30)
Microalb, Ur: 0.2 mg/dL

## 2021-06-09 LAB — LIPID PANEL
Cholesterol: 150 mg/dL (ref <200)
HDL: 41 mg/dL — ABNORMAL LOW (ref >=50)
LDL Cholesterol (Calc): 80 mg/dL (calc)
Non-HDL Cholesterol (Calc): 109 mg/dL (calc) (ref <130)
Total CHOL/HDL Ratio: 3.7 (calc) (ref <5.0)
Triglycerides: 192 mg/dL — ABNORMAL HIGH (ref <150)

## 2021-06-09 LAB — COMPLETE METABOLIC PANEL WITH GFR
AG Ratio: 1.5 (calc) (ref 1.0–2.5)
ALT: 26 U/L (ref 6–29)
AST: 19 U/L (ref 10–35)
Albumin: 4 g/dL (ref 3.6–5.1)
Alkaline phosphatase (APISO): 42 U/L (ref 37–153)
BUN: 14 mg/dL (ref 7–25)
CO2: 24 mmol/L (ref 20–32)
Calcium: 9 mg/dL (ref 8.6–10.4)
Chloride: 104 mmol/L (ref 98–110)
Creat: 0.72 mg/dL (ref 0.50–1.03)
Globulin: 2.7 g/dL (calc) (ref 1.9–3.7)
Glucose, Bld: 96 mg/dL (ref 65–99)
Potassium: 4.3 mmol/L (ref 3.5–5.3)
Sodium: 139 mmol/L (ref 135–146)
Total Bilirubin: 0.3 mg/dL (ref 0.2–1.2)
Total Protein: 6.7 g/dL (ref 6.1–8.1)
eGFR: 100 mL/min/{1.73_m2} (ref >=60)

## 2021-06-09 LAB — CBC WITH DIFFERENTIAL/PLATELET
Absolute Monocytes: 423 cells/uL (ref 200–950)
Basophils Absolute: 58 cells/uL (ref 0–200)
Basophils Relative: 0.8 %
Eosinophils Absolute: 153 cells/uL (ref 15–500)
Eosinophils Relative: 2.1 %
HCT: 36 % (ref 35.0–45.0)
Hemoglobin: 12 g/dL (ref 11.7–15.5)
Lymphs Abs: 1913 cells/uL (ref 850–3900)
MCH: 27.9 pg (ref 27.0–33.0)
MCHC: 33.3 g/dL (ref 32.0–36.0)
MCV: 83.7 fL (ref 80.0–100.0)
MPV: 9.6 fL (ref 7.5–12.5)
Monocytes Relative: 5.8 %
Neutro Abs: 4752 cells/uL (ref 1500–7800)
Neutrophils Relative %: 65.1 %
Platelets: 312 10*3/uL (ref 140–400)
RBC: 4.3 10*6/uL (ref 3.80–5.10)
RDW: 14 % (ref 11.0–15.0)
Total Lymphocyte: 26.2 %
WBC: 7.3 10*3/uL (ref 3.8–10.8)

## 2021-06-09 LAB — HEMOGLOBIN A1C
Hgb A1c MFr Bld: 5.8 % of total Hgb — ABNORMAL HIGH (ref <5.7)
Mean Plasma Glucose: 120 mg/dL
eAG (mmol/L): 6.6 mmol/L

## 2021-06-09 LAB — VITAMIN D 25 HYDROXY (VIT D DEFICIENCY, FRACTURES): Vit D, 25-Hydroxy: 46 ng/mL (ref 30–100)

## 2021-06-09 LAB — TSH: TSH: 2.33 mIU/L

## 2021-06-09 LAB — IRON: Iron: 60 ug/dL (ref 45–160)

## 2021-06-09 LAB — VITAMIN B1: Vitamin B1 (Thiamine): 16 nmol/L (ref 8–30)

## 2021-06-09 LAB — VITAMIN B12: Vitamin B-12: 445 pg/mL (ref 200–1100)

## 2021-06-12 ENCOUNTER — Other Ambulatory Visit (HOSPITAL_COMMUNITY): Payer: Self-pay

## 2021-06-12 ENCOUNTER — Other Ambulatory Visit: Payer: Self-pay | Admitting: Internal Medicine

## 2021-06-12 MED ORDER — ATORVASTATIN CALCIUM 20 MG PO TABS
20.0000 mg | ORAL_TABLET | Freq: Every day | ORAL | 3 refills | Status: DC
  Filled 2021-06-12 – 2021-06-16 (×2): qty 90, 90d supply, fill #0
  Filled 2021-09-18: qty 90, 90d supply, fill #1
  Filled 2021-11-30: qty 90, 90d supply, fill #2
  Filled 2022-02-26: qty 90, 90d supply, fill #3

## 2021-06-12 NOTE — Telephone Encounter (Signed)
Patient agreed to increased dose of atorvastatin 10 mg cancelled and 20 mg sent in to her pharmacy.

## 2021-06-13 ENCOUNTER — Other Ambulatory Visit (HOSPITAL_COMMUNITY): Payer: Self-pay

## 2021-06-15 ENCOUNTER — Encounter: Payer: Self-pay | Admitting: Gastroenterology

## 2021-06-15 ENCOUNTER — Encounter: Payer: Self-pay | Admitting: Internal Medicine

## 2021-06-15 ENCOUNTER — Other Ambulatory Visit (HOSPITAL_COMMUNITY): Payer: Self-pay

## 2021-06-15 ENCOUNTER — Other Ambulatory Visit: Payer: Self-pay

## 2021-06-15 ENCOUNTER — Ambulatory Visit (INDEPENDENT_AMBULATORY_CARE_PROVIDER_SITE_OTHER): Payer: 59 | Admitting: Internal Medicine

## 2021-06-15 ENCOUNTER — Ambulatory Visit (INDEPENDENT_AMBULATORY_CARE_PROVIDER_SITE_OTHER): Payer: 59 | Admitting: Gastroenterology

## 2021-06-15 VITALS — BP 134/74 | HR 91 | Ht 58.5 in | Wt 249.8 lb

## 2021-06-15 VITALS — BP 120/80 | HR 96 | Temp 98.4°F | Ht 58.5 in | Wt 238.0 lb

## 2021-06-15 DIAGNOSIS — E781 Pure hyperglyceridemia: Secondary | ICD-10-CM | POA: Diagnosis not present

## 2021-06-15 DIAGNOSIS — Z6841 Body Mass Index (BMI) 40.0 and over, adult: Secondary | ICD-10-CM

## 2021-06-15 DIAGNOSIS — Z800 Family history of malignant neoplasm of digestive organs: Secondary | ICD-10-CM

## 2021-06-15 DIAGNOSIS — E786 Lipoprotein deficiency: Secondary | ICD-10-CM | POA: Diagnosis not present

## 2021-06-15 DIAGNOSIS — Z1231 Encounter for screening mammogram for malignant neoplasm of breast: Secondary | ICD-10-CM

## 2021-06-15 DIAGNOSIS — Z01818 Encounter for other preprocedural examination: Secondary | ICD-10-CM | POA: Insufficient documentation

## 2021-06-15 DIAGNOSIS — Z8639 Personal history of other endocrine, nutritional and metabolic disease: Secondary | ICD-10-CM

## 2021-06-15 DIAGNOSIS — G2581 Restless legs syndrome: Secondary | ICD-10-CM

## 2021-06-15 DIAGNOSIS — T8859XS Other complications of anesthesia, sequela: Secondary | ICD-10-CM

## 2021-06-15 DIAGNOSIS — Z1211 Encounter for screening for malignant neoplasm of colon: Secondary | ICD-10-CM | POA: Diagnosis not present

## 2021-06-15 DIAGNOSIS — Z124 Encounter for screening for malignant neoplasm of cervix: Secondary | ICD-10-CM

## 2021-06-15 DIAGNOSIS — Z0000 Encounter for general adult medical examination without abnormal findings: Secondary | ICD-10-CM

## 2021-06-15 DIAGNOSIS — T8859XA Other complications of anesthesia, initial encounter: Secondary | ICD-10-CM | POA: Insufficient documentation

## 2021-06-15 DIAGNOSIS — G4733 Obstructive sleep apnea (adult) (pediatric): Secondary | ICD-10-CM | POA: Diagnosis not present

## 2021-06-15 LAB — POCT URINALYSIS DIPSTICK
Bilirubin, UA: NEGATIVE
Glucose, UA: NEGATIVE
Leukocytes, UA: NEGATIVE
Nitrite, UA: NEGATIVE
Protein, UA: NEGATIVE
Spec Grav, UA: 1.02 (ref 1.010–1.025)
Urobilinogen, UA: 0.2 E.U./dL
pH, UA: 5 (ref 5.0–8.0)

## 2021-06-15 MED ORDER — NA SULFATE-K SULFATE-MG SULF 17.5-3.13-1.6 GM/177ML PO SOLN
1.0000 | ORAL | 0 refills | Status: DC
  Filled 2021-06-15: qty 354, 1d supply, fill #0

## 2021-06-15 NOTE — Progress Notes (Signed)
[facility] VISIT   Primary Care Provider Renold Genta Cresenciano Lick, MD [address] [place] [address] [phone]  Referring Provider Dr. Thermon Leyland  Patient Profile: Connie Hall is a 55 y.o. female with a pmh significant for morbid obesity (pending gastric bypass/gastric sleeve intervention), hyperlipidemia, sleep apnea, RLS, reported lupus, family history pancreas cancer (father/sister/paternal uncle).  The patient presents to the [facility] for an evaluation and management of problem(s) noted below:  Problem List 1. Colon cancer screening   2. Family history of pancreatic cancer   3. Morbid obesity ([place])   4. Encounter for preoperative examination for general surgical procedure   5. Complication of anesthesia, sequela     History of Present Illness This is the patient's first visit to the outpatient [facility] clinic.  The patient is referred by her bariatric surgeon for colon cancer screening and upper endoscopy evaluation prior to planned gastric bypass/gastric sleeve.  The patient thankfully does not experience significant issues with heartburn/pyrosis.  She denies any dysphagia or odynophagia symptoms.  The patient has normal bowel habits on a regular basis.  She has not noted any blood in her stools.  She has never had colon cancer screening in the past via colonoscopy or stool based testing.  She is ready for this.  She has had issues with anesthesia in the past and required longer postprocedural evaluation as well as epinephrine at times when done in surgical centers.  She does not describe a difficult airway however.  The patient does not take significant nonsteroidals or BC/Goody powders.  She is hopeful to get her procedures done soon so that she may undergo her bariatric procedure as soon as her bariatric surgeon feels she is ready.  Due to the patient's family history, the patient has previously been evaluated by genetics  and although no genetic mutation was found she did meet criteria for consideration of high risk breast cancer screening but she deferred on this at the time because of nondesire of driving to [facility] or [facility] on multiple occasions but this was years ago and she may be interested in consideration of high rescreening in the future.  GI Review of Systems Positive as above Negative for abdominal pain, nausea, vomiting, anorexia, alteration of bowel habits, melena, hematochezia  Review of Systems General: Denies fevers/chills/unintentional weight loss HEENT: Denies oral lesions Cardiovascular: Denies chest pain Pulmonary: Denies shortness of breath Gastroenterological: See HPI Genitourinary: Denies darkened urine Hematological: Denies easy bruising/bleeding Dermatological: Denies jaundice Psychological: Mood is stable   Medications Current Outpatient Medications  Medication Sig Dispense Refill   ALLEGRA-D ALLERGY & CONGESTION 60-120 MG 12 hr tablet TAKE 1 TABLET BY MOUTH TWICE A DAY (Patient taking differently: as needed.) 180 tablet 2   atorvastatin (LIPITOR) 20 MG tablet Take 1 tablet (20 mg total) by mouth daily. 90 tablet 3   fluticasone (FLONASE) 50 MCG/ACT nasal spray USE 2 SPRAYS IN EACH NOSTRIL ONCE A DAY (Patient taking differently: Place 2 sprays into both nostrils daily as needed.) 48 g 8   glucose blood test strip Use as instructed 2x a day - for One Touch Verio Reflect 200 each 3   metFORMIN (GLUCOPHAGE) 1000 MG tablet TAKE 1 TABLET BY MOUTH DAILY WITH SUPPER 90 tablet 3   Multiple Vitamins-Minerals (MULTIVITAMIN WITH MINERALS) tablet Take 1 tablet by mouth daily.     Na Sulfate-K Sulfate-Mg Sulf (SUPREP BOWEL PREP KIT) 17.5-3.13-1.6 GM/177ML SOLN Take 1 kit by mouth as directed. 354 mL 0  OneTouch Delica Lancets 33G MISC Use 2x a day - for One Touch Verio Reflect 200 each 3   rOPINIRole (REQUIP) 0.5 MG tablet TAKE 1 TABLET BY MOUTH EVERY AFTERNOON AND 2 TABLETS AT BEDTIME 270 tablet  3   Semaglutide, 1 MG/DOSE, (OZEMPIC, 1 MG/DOSE,) 4 MG/3ML SOPN Inject 1 mg into the skin once a week. 9 mL 3   vitamin C (ASCORBIC ACID) 500 MG tablet Take 500 mg by mouth daily as needed. seasonally     zinc gluconate 50 MG tablet Take 50 mg by mouth as needed.      No current facility-administered medications for this visit.    Allergies Allergies  Allergen Reactions   Sulfa Antibiotics Hives   Sulfonamide Derivatives Hives    Histories Past Medical History:  Diagnosis Date   Back pain    Deviated nasal septum    Discoid lupus    DM (diabetes mellitus) (HCC)    Family history of pancreatic cancer    Family history of prostate cancer    History of discoid lupus erythematosus    Hyperlipidemia    Leg edema    Obesity    OSA (obstructive sleep apnea)    Restless legs syndrome (RLS)    Vitamin D deficiency    Past Surgical History:  Procedure Laterality Date   ROTATOR CUFF REPAIR     left   Sebaceous cyst removal, back  03/2008   Social History   Socioeconomic History   Marital status: Single    Spouse name: Not on file   Number of children: 0   Years of education: Not on file   Highest education level: Not on file  Occupational History   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    Employer: [employer]  Tobacco Use   Smoking status: Former    Packs/day: 1.00    Years: 15.00    Pack years: 15.00    Types: Cigarettes    Quit date: 08/27/1998    Years since quitting: 22.8   Smokeless tobacco: Never  Vaping Use   Vaping Use: Never used  Substance and Sexual Activity   Alcohol use: Yes    Comment: rare   Drug use: No   Sexual activity: Not on file  Other Topics Concern   Not on file  Social History Narrative   Not on file   Social Determinants of Health   Financial Resource Strain: Not on file  Food Insecurity: Not on file  Transportation Needs: Not on file  Physical Activity: Not on file  Stress: Not on file  Social Connections: Not on file   Intimate Partner Violence: Not on file   Family History  Problem Relation Age of Onset   Skin cancer Mother    Hyperlipidemia Mother    Hypertension Father    Stroke Father    Heart disease Father    Diabetes Father    Pancreatic cancer Father 3   Prostate cancer Father        dx in his 39s; prostatectomy   Sleep apnea Father    Obesity Father    Pancreatic cancer Sister 21       neuroendocrine tumor   Colon polyps Sister    Colon polyps Sister    Colon polyps Sister    Congestive Heart Failure Maternal Aunt    Dementia Maternal Uncle    Lung cancer Paternal Aunt    Heart attack Paternal Uncle    Pancreatic cancer Paternal Uncle 68  Testicular cancer Cousin        paternal first cousin dx in his 50s   Colon cancer Neg Hx    Esophageal cancer Neg Hx    Inflammatory bowel disease Neg Hx    Liver disease Neg Hx    Stomach cancer Neg Hx    Rectal cancer Neg Hx    I have reviewed her medical, social, and family history in detail and updated the electronic medical record as necessary.    PHYSICAL EXAMINATION  BP 134/74   Pulse 91   Ht 4' 10.5" (1.486 m)   Wt 249 lb 12.8 oz (113.3 kg)   LMP 02/08/2021 (Approximate)   SpO2 97%   BMI 51.32 kg/m  Wt Readings from Last 3 Encounters:  06/15/21 238 lb (108 kg)  06/15/21 249 lb 12.8 oz (113.3 kg)  04/27/21 230 lb (104.3 kg)  GEN: NAD, appears stated age, doesn't appear chronically ill PSYCH: Cooperative, without pressured speech EYE: Conjunctivae pink, sclerae anicteric ENT: MMM NECK: Enlarged neck girth CV: Nontachycardic RESP: CTAB posteriorly, without wheezing GI: NABS, soft, rounded, obese, nontender, without rebound or guarding, unable to appreciate hepatosplenomegaly due to body habitus MSK/EXT: No significant lower extremity edema SKIN: No jaundice NEURO:  Alert & Oriented x 3, no focal deficits   REVIEW OF DATA  I reviewed the following data at the time of this encounter:  GI Procedures and Studies  No  relevant studies to review  Laboratory Studies  Reviewed those in epic  Imaging Studies  No new imaging studies to review   ASSESSMENT  Ms. Connie Hall is a 55 y.o. female with a pmh significant for morbid obesity (pending gastric bypass/gastric sleeve intervention), hyperlipidemia, sleep apnea, RLS, reported lupus, family history pancreas cancer (father/sister/paternal uncle).  The patient is seen today for evaluation and management of:  1. Colon cancer screening   2. Family history of pancreatic cancer   3. Morbid obesity ([place])   4. Encounter for preoperative examination for general surgical procedure   5. Complication of anesthesia, sequela    The patient is hemodynamically and clinically stable.  She is an appropriate candidate for colon cancer screening and prebariatric endoscopic evaluation from above to ensure no abnormalities that may cause bariatric surgery to consider other interventions other than a gastric sleeve.  Due to the patient's prior issues with anesthesia and also her BMI her case will need to be performed in the hospital-based setting and we will schedule accordingly.  Interestingly, the patient does meet criteria via CAPS cohort for consideration of high risk pancreas cancer screening due to having 3 family members with pancreatic cancer and 2 being first-degree members.  She previously was evaluated by genetics and oncology and was felt to be a candidate but did not want to drive to [facility] or [facility] for high risk pancreas cancer screening so decision was made to just do nonintervention.  We did discussed the role of high risk pancreas cancer screening and the different modalities by which we monitor a patient.  In our center, we performed a every 6 month EUS alternating with MRI CP but if there are financial hardships then yearly EUS alternating with MRI CP could be considered but patients need to understand that when you go into a screening cohort there can be increased risks and  need for biopsies or sampling that could put a patient at increased risks as well but it is a risk/benefit value that a patient has to determine themselves.  She will think  about this and let us know if she would like to enter into our screening cohort.  The risks and benefits of endoscopic evaluation were discussed with the patient; these include but are not limited to the risk of perforation, infection, bleeding, missed lesions, lack of diagnosis, severe illness requiring hospitalization, as well as anesthesia and sedation related illnesses.  The patient and/or family is agreeable to proceed.  All patient questions were answered to the best of my ability, and the patient agrees to the aforementioned plan of action with follow-up as indicated.   PLAN  Preprocedure labs to be obtained as case to be done in hospital setting Proceed with scheduling diagnostic EGD and screening colonoscopy in hospital-based setting - Anesthesia to review chart in setting of prior postprocedural anesthetic issues that have occurred in her distant medical history Patient will think about whether she would like to enroll in our high risk pancreas cancer screening cohort now that she would not have to drive to [facility] or [facility] regularly - Every 6 month EGD/EUS alternating with MRI/MRCP is what we use most often - Consideration of yearly monitoring alternating EGD/EUS with MRI/MRCP could be considered pending patient's costs and insurance Agree that bariatric surgery offers patient significant potential improvements in her health so hopefully she can have this done soon   Orders Placed This Encounter  Procedures   Procedural/ Surgical Case Request: COLONOSCOPY WITH PROPOFOL, ESOPHAGOGASTRODUODENOSCOPY (EGD) WITH PROPOFOL   CBC   Basic Metabolic Panel (BMET)   INR/PT   Ambulatory referral to Gastroenterology    New Prescriptions   NA SULFATE-K SULFATE-MG SULF (SUPREP BOWEL PREP KIT) 17.5-3.13-1.6 GM/177ML SOLN    Take 1 kit  by mouth as directed.   Modified Medications   No medications on file    Planned Follow Up No follow-ups on file.   Total Time in Face-to-Face and in Coordination of Care for patient including independent/personal interpretation/review of prior testing, medical history, examination, medication adjustment, communicating results with the patient directly, and documentation within the EHR is 45 minutes   Justice Britain, MD [facility] Advanced Endoscopy Office # [phone]

## 2021-06-15 NOTE — Patient Instructions (Signed)
We have sent the following medications to your pharmacy for you to pick up at your convenience: [facility] have been scheduled for an endoscopy and colonoscopy. Please follow the written instructions given to you at your visit today. Please pick up your prep supplies at the pharmacy within the next 1-3 days. If you use inhalers (even only as needed), please bring them with you on the day of your procedure.  Your provider has requested that you go to the basement level for lab work before leaving today. Press "B" on the elevator. The lab is located at the first door on the left as you exit the elevator.  Due to recent changes in healthcare laws, you may see the results of your imaging and laboratory studies on MyChart before your provider has had a chance to review them.  We understand that in some cases there may be results that are confusing or concerning to you. Not all laboratory results come back in the same time frame and the provider may be waiting for multiple results in order to interpret others.  Please give [place] 48 hours in order for your provider to thoroughly review all the results before contacting the office for clarification of your results.    If you are age 53 or younger, your body mass index should be between 19-25. Your Body mass index is 51.32 kg/m. If this is out of the aformentioned range listed, please consider follow up with your Primary Care Provider.   ________________________________________________________  The [facility] providers would like to encourage you to use [facility] to communicate with providers for non-urgent requests or questions.  Due to long hold times on the telephone, sending your provider a message by [facility] may be a faster and more efficient way to get a response.  Please allow 48 business hours for a response.  Please remember that this is for non-urgent requests.  _______________________________________________________  Thank you for choosing me and  [facility].  Dr. Rush Landmark

## 2021-06-15 NOTE — Progress Notes (Signed)
Subjective:    Patient ID: Connie Hall, female    DOB: 15-Oct-1965, 55 y.o.   MRN: [id number]  HPI 55 year old Female seen for health maintenance exam and evaluation of medical issues. Planning to have Bariatric Surgery in February. Has to have colonoscopy and endoscopy in the near future. Because of Family Hx of pancreatic cancer, will be participating in screening program for that through [facility].  Labs reviewed. Triglycerides elevated at 192. Just came back from vacation to [place] and [place].Is on Lipitor 20 mg daily. Will plan follow up a few months after Bariatric surgery I..e. late Spring or early Summer.  Has low HDL of 41 likely inherited.  Hgb AIC stable at 5.8% and will likely improve after surgery. Has been 7.1% in Feb. 2022. CBC and C-met are normal.   Serum Iron level and Vitamin D level  are normal.  Sees Dr. Elvera Lennox for type 2 diabetes mellitus and hyperlipidemia.  Currently on Ozempic.  She has a history of restless leg syndrome for which she takes Requip.  History of myopia.  Currently on Lipitor 20 mg daily.  Also on metformin 1000 mg daily.  History of obstructive sleep apnea seen by Dr. Vassie Loll and has CPAP machine.  In 2017 was seen by cornea specialist from [facility] in [place] regarding retinal holes.  History of plantar fasciitis, hyperlipidemia, obesity, restless leg syndrome, controlled type 2 diabetes without complication, dependent edema, deviated nasal septum, metabolic syndrome, and history of vitamin D deficiency.  She is allergic to Sulfa-it causes hives.  Obtains annual flu vaccine through employment.  Tetanus immunization is up-to-date.  Had COVID booster in August.  Appointment with GI regarding colon cancer screening and consultation regarding family history of pancreatic cancer today at [facility].  Social history: Single, never married.  Does not smoke.  Seldom consumes alcohol.  Has a college degree.  Has family in [place].  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  Family history: Father with history of hypertension, stroke, coronary artery disease, kidney stones and diabetes.  Mother with history of osteoporosis.  No brothers.  4 sisters, 2 of whom have diabetes.  2 sisters have sleep apnea.  3 sisters are overweight.  One sister is overall in good health.   Tried going to [facility] but did not lose much weight  Saw Dr. Truett Perna in 2018 to discuss genetics with family history of pancreatic cancer.  Genetic screening was negative.  Father and paternal uncle died of pancreatic cancer.  Sister was diagnosed with a neuroendocrine tumor at the tail of the pancreas in 2017.   Review of Systems no new complaints. Did well on plane and cruise ship. Had no illnesses during vacation. Has had Flu vaccine and Covid booster. Tdap up to date     Objective:   Physical Exam Blood pressure 120/80 pulse 96 temperature 98.4 degrees weight 238 pounds BMI 48.90.  Has lost from 251 pounds in August 2020 and BMI at that time was 52.46  Skin: Warm and dry.  No cervical adenopathy or thyromegaly.  No carotid bruits.  Chest is clear to auscultation.  Breasts are without masses.  Cardiac exam: Regular rate and rhythm without ectopy or murmur.  Abdomen obese soft nondistended without hepatosplenomegaly masses or tenderness.  No pitting lower extremity edema.  Neurological exam is intact without focal deficits.  Affect thought and judgment are normal.     Assessment & Plan:  BMI 48.90 and is considering gastric bypass surgery  Screening for colon cancer-has appointment to see gastroenterologist at [facility]  Family history of pancreatic cancer and has had negative genetic testing  Hyperlipidemia treated with statin  Controlled type 2 diabetes mellitus seen by endocrinologist  Restless leg syndrome  History of sleep apnea and has CPAP device  History of vitamin D  deficiency  Plan: Triglycerides are elevated at 192.  History of low HDL.  Currently HDL is 41.  Total cholesterol was normal at 150 and LDL cholesterol is normal at 80.  Hemoglobin A1c stable at 5.8%.  Vitamin D level is within normal limits as is TSH and CBC BUN and creatinine are normal.  Electrolytes and liver functions are normal.  Patient has upcoming consultation with gastroenterologist in anticipation of gastric bypass surgery.  She will see gastroenterologist soon and see endocrinologist in 6 months.  I will see her again in 1 year.

## 2021-06-15 NOTE — Progress Notes (Signed)
anm

## 2021-06-16 ENCOUNTER — Other Ambulatory Visit (HOSPITAL_COMMUNITY): Payer: Self-pay

## 2021-06-25 ENCOUNTER — Telehealth: Payer: 59 | Admitting: Emergency Medicine

## 2021-06-25 DIAGNOSIS — B9789 Other viral agents as the cause of diseases classified elsewhere: Secondary | ICD-10-CM

## 2021-06-25 DIAGNOSIS — J329 Chronic sinusitis, unspecified: Secondary | ICD-10-CM | POA: Diagnosis not present

## 2021-06-25 DIAGNOSIS — H9209 Otalgia, unspecified ear: Secondary | ICD-10-CM | POA: Diagnosis not present

## 2021-06-25 MED ORDER — AMOXICILLIN-POT CLAVULANATE 875-125 MG PO TABS
1.0000 | ORAL_TABLET | Freq: Two times a day (BID) | ORAL | 0 refills | Status: DC
  Filled 2021-06-25: qty 20, 10d supply, fill #0

## 2021-06-25 NOTE — Progress Notes (Signed)
E-Visit for Sinus Problems  We are sorry that you are not feeling well.  Here is how we plan to help!  Based on what you have shared with me it looks like you have sinusitis.  Sinusitis is inflammation and infection in the sinus cavities of the head.  Based on your presentation I believe you most likely have Acute Viral Sinusitis.This is an infection most likely caused by a virus. There is not specific treatment for viral sinusitis other than to help you with the symptoms until the infection runs its course.  You may use an oral decongestant such as Mucinex D or if you have glaucoma or high blood pressure use plain Mucinex. Saline nasal spray help and can safely be used as often as needed for congestion.  Some authorities believe that zinc sprays or the use of Echinacea may shorten the course of your symptoms.  Sinus infections are not as easily transmitted as other respiratory infection, however we still recommend that you avoid close contact with loved ones, especially the very young and elderly.  Remember to wash your hands thoroughly throughout the day as this is the number one way to prevent the spread of infection!  Based on length of symptoms, you most likely have a viral sinus infection.  Although viruses are not treated with an antibiotic, they can sometimes lead to a bacterial infection. I will prescribe augmentin 875 mg twice a day x 10 days.  Please wait 2-3 more days, and if symptoms do not improve or worsen, then you can start taking the prescribed antibiotic.  I hope you feel better soon!  Home Care: Only take medications as instructed by your medical team. Do not take these medications with alcohol. A steam or ultrasonic humidifier can help congestion.  You can place a towel over your head and breathe in the steam from hot water coming from a faucet. Avoid close contacts especially the very young and the elderly. Cover your mouth when you cough or sneeze. Always remember to wash  your hands.  Get Help Right Away If: You develop worsening fever or sinus pain. You develop a severe head ache or visual changes. Your symptoms persist after you have completed your treatment plan.  Make sure you Understand these instructions. Will watch your condition. Will get help right away if you are not doing well or get worse.   Thank you for choosing an e-visit.  Your e-visit answers were reviewed by a board certified advanced clinical practitioner to complete your personal care plan. Depending upon the condition, your plan could have included both over the counter or prescription medications.  Please review your pharmacy choice. Make sure the pharmacy is open so you can pick up prescription now. If there is a problem, you may contact your provider through [employer] and have the prescription routed to another pharmacy.  Your safety is important to [place]. If you have drug allergies check your prescription carefully.   For the next 24 hours you can use MyChart to ask questions about today's visit, request a non-urgent call back, or ask for a work or school excuse. You will get an email in the next two days asking about your experience. I hope that your e-visit has been valuable and will speed your recovery.

## 2021-06-25 NOTE — Progress Notes (Signed)
I have spent 5 minutes in review of e-visit questionnaire, review and updating patient chart, medical decision making and response to patient.   Jahel Wavra, PA-C    

## 2021-06-26 ENCOUNTER — Other Ambulatory Visit (HOSPITAL_COMMUNITY): Payer: Self-pay

## 2021-07-03 DIAGNOSIS — G4733 Obstructive sleep apnea (adult) (pediatric): Secondary | ICD-10-CM | POA: Diagnosis not present

## 2021-07-24 ENCOUNTER — Ambulatory Visit (HOSPITAL_COMMUNITY)
Admission: RE | Admit: 2021-07-24 | Discharge: 2021-07-24 | Disposition: A | Discharge status: Home / Self Care | Payer: 59 | Source: Ambulatory Visit | Attending: Gastroenterology | Admitting: Gastroenterology

## 2021-07-24 ENCOUNTER — Other Ambulatory Visit (HOSPITAL_COMMUNITY): Payer: Self-pay

## 2021-07-24 ENCOUNTER — Ambulatory Visit (HOSPITAL_COMMUNITY): Payer: 59 | Admitting: Anesthesiology

## 2021-07-24 ENCOUNTER — Encounter (HOSPITAL_COMMUNITY): Payer: Self-pay | Admitting: Gastroenterology

## 2021-07-24 ENCOUNTER — Other Ambulatory Visit: Payer: Self-pay

## 2021-07-24 ENCOUNTER — Encounter (HOSPITAL_COMMUNITY)
Admission: RE | Disposition: A | Discharge status: Home / Self Care | Payer: Self-pay | Source: Ambulatory Visit | Attending: Gastroenterology

## 2021-07-24 DIAGNOSIS — K644 Residual hemorrhoidal skin tags: Secondary | ICD-10-CM | POA: Insufficient documentation

## 2021-07-24 DIAGNOSIS — K2210 Ulcer of esophagus without bleeding: Secondary | ICD-10-CM | POA: Diagnosis not present

## 2021-07-24 DIAGNOSIS — Z87891 Personal history of nicotine dependence: Secondary | ICD-10-CM | POA: Insufficient documentation

## 2021-07-24 DIAGNOSIS — K641 Second degree hemorrhoids: Secondary | ICD-10-CM | POA: Diagnosis not present

## 2021-07-24 DIAGNOSIS — G4733 Obstructive sleep apnea (adult) (pediatric): Secondary | ICD-10-CM | POA: Diagnosis not present

## 2021-07-24 DIAGNOSIS — K209 Esophagitis, unspecified without bleeding: Secondary | ICD-10-CM | POA: Insufficient documentation

## 2021-07-24 DIAGNOSIS — Z8371 Family history of colonic polyps: Secondary | ICD-10-CM | POA: Insufficient documentation

## 2021-07-24 DIAGNOSIS — T8859XS Other complications of anesthesia, sequela: Secondary | ICD-10-CM

## 2021-07-24 DIAGNOSIS — D128 Benign neoplasm of rectum: Secondary | ICD-10-CM | POA: Diagnosis not present

## 2021-07-24 DIAGNOSIS — Z01818 Encounter for other preprocedural examination: Secondary | ICD-10-CM | POA: Diagnosis not present

## 2021-07-24 DIAGNOSIS — K2289 Other specified disease of esophagus: Secondary | ICD-10-CM | POA: Insufficient documentation

## 2021-07-24 DIAGNOSIS — D127 Benign neoplasm of rectosigmoid junction: Secondary | ICD-10-CM | POA: Diagnosis not present

## 2021-07-24 DIAGNOSIS — K3189 Other diseases of stomach and duodenum: Secondary | ICD-10-CM | POA: Insufficient documentation

## 2021-07-24 DIAGNOSIS — K5730 Diverticulosis of large intestine without perforation or abscess without bleeding: Secondary | ICD-10-CM | POA: Insufficient documentation

## 2021-07-24 DIAGNOSIS — Z1211 Encounter for screening for malignant neoplasm of colon: Secondary | ICD-10-CM | POA: Diagnosis not present

## 2021-07-24 DIAGNOSIS — D120 Benign neoplasm of cecum: Secondary | ICD-10-CM | POA: Diagnosis not present

## 2021-07-24 DIAGNOSIS — K621 Rectal polyp: Secondary | ICD-10-CM | POA: Diagnosis not present

## 2021-07-24 DIAGNOSIS — K449 Diaphragmatic hernia without obstruction or gangrene: Secondary | ICD-10-CM | POA: Diagnosis not present

## 2021-07-24 DIAGNOSIS — K635 Polyp of colon: Secondary | ICD-10-CM | POA: Diagnosis not present

## 2021-07-24 DIAGNOSIS — Z6841 Body Mass Index (BMI) 40.0 and over, adult: Secondary | ICD-10-CM | POA: Insufficient documentation

## 2021-07-24 DIAGNOSIS — E119 Type 2 diabetes mellitus without complications: Secondary | ICD-10-CM | POA: Insufficient documentation

## 2021-07-24 DIAGNOSIS — D126 Benign neoplasm of colon, unspecified: Secondary | ICD-10-CM | POA: Diagnosis not present

## 2021-07-24 DIAGNOSIS — Z800 Family history of malignant neoplasm of digestive organs: Secondary | ICD-10-CM

## 2021-07-24 DIAGNOSIS — Z8507 Personal history of malignant neoplasm of pancreas: Secondary | ICD-10-CM | POA: Diagnosis not present

## 2021-07-24 HISTORY — PX: POLYPECTOMY: SHX5525

## 2021-07-24 HISTORY — PX: ESOPHAGOGASTRODUODENOSCOPY (EGD) WITH PROPOFOL: SHX5813

## 2021-07-24 HISTORY — PX: BIOPSY: SHX5522

## 2021-07-24 HISTORY — PX: COLONOSCOPY WITH PROPOFOL: SHX5780

## 2021-07-24 LAB — GLUCOSE, CAPILLARY: Glucose-Capillary: 91 mg/dL (ref 70–99)

## 2021-07-24 SURGERY — COLONOSCOPY WITH PROPOFOL
Anesthesia: Monitor Anesthesia Care

## 2021-07-24 MED ORDER — OMEPRAZOLE 20 MG PO CPDR
20.0000 mg | DELAYED_RELEASE_CAPSULE | Freq: Every day | ORAL | 12 refills | Status: DC
  Filled 2021-07-24: qty 30, 30d supply, fill #0
  Filled 2021-07-25 – 2021-09-18 (×2): qty 30, 30d supply, fill #1
  Filled 2021-10-24: qty 30, 30d supply, fill #2
  Filled 2022-04-19: qty 30, 30d supply, fill #3

## 2021-07-24 MED ORDER — SODIUM CHLORIDE 0.9 % IV SOLN: INTRAVENOUS | Status: DC

## 2021-07-24 MED ORDER — LIDOCAINE HCL 1 % IJ SOLN
INTRAMUSCULAR | Status: DC | PRN
  Administered 2021-07-24: 60 mg via INTRADERMAL

## 2021-07-24 MED ORDER — PROPOFOL 500 MG/50ML IV EMUL
INTRAVENOUS | Status: DC | PRN
  Administered 2021-07-24: 125 ug/kg/min via INTRAVENOUS

## 2021-07-24 MED ORDER — LACTATED RINGERS IV SOLN: INTRAVENOUS | Status: DC

## 2021-07-24 MED ORDER — PROPOFOL 500 MG/50ML IV EMUL
INTRAVENOUS | Status: AC
  Filled 2021-07-24: qty 50

## 2021-07-24 MED ORDER — PROPOFOL 10 MG/ML IV BOLUS
INTRAVENOUS | Status: DC | PRN
  Administered 2021-07-24 (×2): 10 mg via INTRAVENOUS

## 2021-07-24 SURGICAL SUPPLY — 25 items

## 2021-07-24 NOTE — Anesthesia Preprocedure Evaluation (Addendum)
Anesthesia Evaluation  Patient identified by MRN, date of birth, ID band Patient awake    Reviewed: Allergy & Precautions, H&P , NPO status , Patient's Chart, lab work & pertinent test results  Airway Mallampati: III  TM Distance: >3 FB Neck ROM: Full    Dental no notable dental hx. (+) Teeth Intact, Dental Advisory Given   Pulmonary sleep apnea and Continuous Positive Airway Pressure Ventilation , former smoker,    Pulmonary exam normal breath sounds clear to auscultation       Cardiovascular negative cardio ROS   Rhythm:Regular Rate:Normal     Neuro/Psych negative neurological ROS  negative psych ROS   GI/Hepatic negative GI ROS, Neg liver ROS,   Endo/Other  diabetes, Type 2Morbid obesity  Renal/GU negative Renal ROS  negative genitourinary   Musculoskeletal   Abdominal   Peds  Hematology negative hematology ROS (+)   Anesthesia Other Findings   Reproductive/Obstetrics negative OB ROS                            Anesthesia Physical Anesthesia Plan  ASA: 3  Anesthesia Plan: MAC   Post-op Pain Management: Minimal or no pain anticipated   Induction: Intravenous  PONV Risk Score and Plan: 2 and Propofol infusion and Treatment may vary due to age or medical condition  Airway Management Planned: Simple Face Mask  Additional Equipment:   Intra-op Plan:   Post-operative Plan:   Informed Consent: I have reviewed the patients History and Physical, chart, labs and discussed the procedure including the risks, benefits and alternatives for the proposed anesthesia with the patient or authorized representative who has indicated his/her understanding and acceptance.     Dental advisory given  Plan Discussed with: CRNA  Anesthesia Plan Comments:         Anesthesia Quick Evaluation

## 2021-07-24 NOTE — Discharge Instructions (Signed)
YOU HAD AN ENDOSCOPIC PROCEDURE TODAY: Refer to the procedure report and other information in the discharge instructions given to you for any specific questions about what was found during the examination. If this information does not answer your questions, please call [place] office at [phone] to clarify.  ° °YOU SHOULD EXPECT: Some feelings of bloating in the abdomen. Passage of more gas than usual. Walking can help get rid of the air that was put into your GI tract during the procedure and reduce the bloating. If you had a lower endoscopy (such as a colonoscopy or flexible sigmoidoscopy) you may notice spotting of blood in your stool or on the toilet paper. Some abdominal soreness may be present for a day or two, also. ° °DIET: Your first meal following the procedure should be a light meal and then it is ok to progress to your normal diet. A half-sandwich or bowl of soup is an example of a good first meal. Heavy or fried foods are harder to digest and may make you feel nauseous or bloated. Drink plenty of fluids but you should avoid alcoholic beverages for 24 hours. If you had a esophageal dilation, please see attached instructions for diet.   ° °ACTIVITY: Your care partner should take you home directly after the procedure. You should plan to take it easy, moving slowly for the rest of the day. You can resume normal activity the day after the procedure however YOU SHOULD NOT DRIVE, use power tools, machinery or perform tasks that involve climbing or major physical exertion for 24 hours (because of the sedation medicines used during the test).  ° °SYMPTOMS TO REPORT IMMEDIATELY: °A gastroenterologist can be reached at any hour. Please call [phone]  for any of the following symptoms:  °Following lower endoscopy (colonoscopy, flexible sigmoidoscopy) °Excessive amounts of blood in the stool  °Significant tenderness, worsening of abdominal pains  °Swelling of the abdomen that is new, acute  °Fever of 100° or  higher  °Following upper endoscopy (EGD, EUS, ERCP, esophageal dilation) °Vomiting of blood or coffee ground material  °New, significant abdominal pain  °New, significant chest pain or pain under the shoulder blades  °Painful or persistently difficult swallowing  °New shortness of breath  °Black, tarry-looking or red, bloody stools ° °FOLLOW UP:  °If any biopsies were taken you will be contacted by phone or by letter within the next 1-3 weeks. Call [phone]  if you have not heard about the biopsies in 3 weeks.  °Please also call with any specific questions about appointments or follow up tests. ° °

## 2021-07-24 NOTE — Op Note (Signed)
[facility] Patient Name: Connie Hall Procedure Date: 07/24/2021 MRN: [id number] Attending MD: Justice Britain , MD Date of Birth: 03/30/1966 CSN: [id number] Age: 55 Admit Type: Outpatient Procedure:                Colonoscopy Indications:              Colon cancer screening in patient at increased                            risk: Family history of colon polyps in multiple                            1st-degree relatives, This is the patient's first                            colonoscopy Providers:                Justice Britain, MD, Burtis Junes, RN, [facility]                            Technician, Technician Referring MD:             Cresenciano Lick. [place] Medicines:                Monitored Anesthesia Care Complications:            No immediate complications. Estimated Blood Loss:     Estimated blood loss was minimal. Procedure:                Pre-Anesthesia Assessment:                           - Prior to the procedure, a History and Physical                            was performed, and patient medications and                            allergies were reviewed. The patient's tolerance of                            previous anesthesia was also reviewed. The risks                            and benefits of the procedure and the sedation                            options and risks were discussed with the patient.                            All questions were answered, and informed consent                            was obtained. Prior Anticoagulants: The patient has  taken no previous anticoagulant or antiplatelet                            agents. ASA Grade Assessment: III - A patient with                            severe systemic disease. After reviewing the risks                            and benefits, the patient was deemed in                            satisfactory condition to undergo the procedure.                           After  obtaining informed consent, the colonoscope                            was passed under direct vision. Throughout the                            procedure, the patient's blood pressure, pulse, and                            oxygen saturations were monitored continuously. The                            CF-HQ190L ([id number]) Olympus colonoscope was                            introduced through the anus and advanced to the 4                            cm into the ileum. The colonoscopy was performed                            without difficulty. The patient tolerated the                            procedure. The quality of the bowel preparation was                            adequate. The terminal ileum, ileocecal valve,                            appendiceal orifice, and rectum were photographed. Scope In: 10:27:47 AM Scope Out: 10:47:58 AM Scope Withdrawal Time: 0 hours 14 minutes 13 seconds  Total Procedure Duration: 0 hours 20 minutes 11 seconds  Findings:      The digital rectal exam findings include hemorrhoids. Pertinent       negatives include no palpable rectal lesions.      The terminal ileum and ileocecal valve appeared normal.      Six sessile polyps were found in the rectum (2), recto-sigmoid colon (3)  and cecum (1). The polyps were 2 to 8 mm in size. These polyps were       removed with a cold snare. Resection and retrieval were complete.      Multiple small-mouthed diverticula were found in the recto-sigmoid       colon, sigmoid colon and descending colon.      Normal mucosa was found in the entire colon otherwise.      Non-bleeding non-thrombosed external and internal hemorrhoids were found       during retroflexion, during perianal exam and during digital exam. The       hemorrhoids were Grade II (internal hemorrhoids that prolapse but reduce       spontaneously). Impression:               - Hemorrhoids found on digital rectal exam.                           - The  examined portion of the ileum was normal.                           - Six, 2 to 8 mm polyps in the rectum, at the                            recto-sigmoid colon and in the cecum, removed with                            a cold snare. Resected and retrieved.                           - Diverticulosis in the recto-sigmoid colon, in the                            sigmoid colon and in the descending colon.                           - Normal mucosa in the entire examined colon                            otherwise.                           - Non-bleeding non-thrombosed external and internal                            hemorrhoids. Moderate Sedation:      Not Applicable - Patient had care per Anesthesia. Recommendation:           - The patient will be observed post-procedure,                            until all discharge criteria are met.                           - Discharge patient to home.                           -  Patient has a contact number available for                            emergencies. The signs and symptoms of potential                            delayed complications were discussed with the                            patient. Return to normal activities tomorrow.                            Written discharge instructions were provided to the                            patient.                           - High fiber diet.                           - Use FiberCon 1-2 tablets PO daily.                           - Continue present medications.                           - Await pathology results.                           - Repeat colonoscopy in 3/5 years for surveillance                            based on pathology results and family history (no                            later than 5-years due to family history).                           - The findings and recommendations were discussed                            with the patient.                           - The findings and  recommendations were discussed                            with the designated responsible adult. Procedure Code(s):        --- Professional ---                           [phone], Colonoscopy, flexible; with removal of                            tumor(s), polyp(s), or other lesion(s)  by snare                            technique Diagnosis Code(s):        --- Professional ---                           Z83.71, Family history of colonic polyps                           K64.1, Second degree hemorrhoids                           K62.1, Rectal polyp                           K63.5, Polyp of colon                           K57.30, Diverticulosis of large intestine without                            perforation or abscess without bleeding CPT copyright 2019 American Medical Association. All rights reserved. The codes documented in this report are preliminary and upon coder review may  be revised to meet current compliance requirements. Justice Britain, MD 07/24/2021 11:08:26 AM Number of Addenda: 0

## 2021-07-24 NOTE — Transfer of Care (Signed)
Immediate Anesthesia Transfer of Care Note  Patient: Connie Hall  Procedure(s) Performed: COLONOSCOPY WITH PROPOFOL ESOPHAGOGASTRODUODENOSCOPY (EGD) WITH PROPOFOL BIOPSY POLYPECTOMY  Patient Location: PACU and Endoscopy Unit  Anesthesia Type:MAC  Level of Consciousness: awake, alert , oriented and patient cooperative  Airway & Oxygen Therapy: Patient Spontanous Breathing and Patient connected to face mask oxygen  Post-op Assessment: Report given to RN and Post -op Vital signs reviewed and stable  Post vital signs: Reviewed and stable  Last Vitals:  Vitals Value Taken Time  BP 114/54 07/24/21 1055  Temp 36.5 C 07/24/21 1055  Pulse 87 07/24/21 1057  Resp 18 07/24/21 1057  SpO2 95 % 07/24/21 1057  Vitals shown include unvalidated device data.  Last Pain:  Vitals:   07/24/21 1055  TempSrc: Oral  PainSc: 0-No pain         Complications: No notable events documented.

## 2021-07-24 NOTE — Op Note (Signed)
[facility] Patient Name: Connie Hall Procedure Date: 07/24/2021 MRN: [id number] Attending MD: Justice Britain , MD Date of Birth: July 24, 1966 CSN: [id number] Age: 55 Admit Type: Outpatient Procedure:                Upper GI endoscopy Indications:              Preoperative assessment for bariatric surgery to                            treat morbid obesity Providers:                Justice Britain, MD, Burtis Junes, RN, [facility]                            Technician, Technician Referring MD:             Cresenciano Lick. [place], [place] Medicines:                Monitored Anesthesia Care Complications:            No immediate complications. Estimated Blood Loss:     Estimated blood loss was minimal. Procedure:                Pre-Anesthesia Assessment:                           - Prior to the procedure, a History and Physical                            was performed, and patient medications and                            allergies were reviewed. The patient's tolerance of                            previous anesthesia was also reviewed. The risks                            and benefits of the procedure and the sedation                            options and risks were discussed with the patient.                            All questions were answered, and informed consent                            was obtained. Prior Anticoagulants: The patient has                            taken no previous anticoagulant or antiplatelet                            agents. ASA Grade Assessment: III - A patient with  severe systemic disease. After reviewing the risks                            and benefits, the patient was deemed in                            satisfactory condition to undergo the procedure.                           After obtaining informed consent, the endoscope was                            passed under direct vision. Throughout the                             procedure, the patient's blood pressure, pulse, and                            oxygen saturations were monitored continuously. The                            [id number]-[id number] ([id number]) Olympus endoscope was introduced                            through the mouth, and advanced to the second part                            of duodenum. The upper GI endoscopy was                            accomplished without difficulty. The patient                            tolerated the procedure. Scope In: Scope Out: Findings:      No gross lesions were noted in the proximal esophagus and in the mid       esophagus.      LA Grade A (one or more mucosal breaks less than 5 mm, not extending       between tops of 2 mucosal folds) esophagitis with no bleeding was found       in the distal esophagus.      One tongue of salmon-colored mucosa was present from 35 to 36 cm. No       other visible abnormalities were present. Biopsies were taken with a       cold forceps for histology to rule in/out Barrett's.      The Z-line was irregular and was found 36 cm from the incisors.      A 1 cm hiatal hernia was present.      Striped mildly erythematous mucosa without bleeding was found in the       gastric body and in the gastric antrum.      No other gross lesions were noted in the entire examined stomach.       Biopsies were taken with a cold forceps for histology and Helicobacter       pylori testing.  No gross lesions were noted in the duodenal bulb, in the first portion       of the duodenum and in the second portion of the duodenum. Impression:               - No gross lesions in esophagus proximally. LA                            Grade A esophagitis with no bleeding - distally.                           - Salmon-colored mucosa suspicious for                            short-segment Barrett's esophagus noted distally -                            biopsied.                           -  Z-line irregular, 36 cm from the incisors.                           - 1 cm hiatal hernia.                           - Erythematous mucosa in the gastric body and                            antrum. No other gross lesions in the stomach.                            Biopsied.                           - No gross lesions in the duodenal bulb, in the                            first portion of the duodenum and in the second                            portion of the duodenum. Moderate Sedation:      Not Applicable - Patient had care per Anesthesia. Recommendation:           - Proceed to scheduled colonoscopy.                           - Observe patient's clinical course.                           - Await pathology results.                           - Initiate Omeprazole 20 mg daily to aid in healing                            (  hard to guage how to come off of this in future as                            patient has no symptoms of GERD - extensively                            discussed at clinic visit).                           - However, PPI should be continued if evidence of                            Barrett's esophagus is found.                           - Do not anticipate needing repeat EGD if no                            evidence of Barrett's is found.                           - The findings and recommendations were discussed                            with the patient.                           - The findings and recommendations were discussed                            with the designated responsible adult. Procedure Code(s):        --- Professional ---                           [phone], Esophagogastroduodenoscopy, flexible,                            transoral; with biopsy, single or multiple Diagnosis Code(s):        --- Professional ---                           K22.8, Other specified diseases of esophagus                           K20.90, Esophagitis, unspecified without  bleeding                           K44.9, Diaphragmatic hernia without obstruction or                            gangrene                           K31.89, Other diseases of stomach and duodenum  Z01.818, Encounter for other preprocedural                            examination                           E66.01, Morbid (severe) obesity due to excess                            calories CPT copyright 2019 American Medical Association. All rights reserved. The codes documented in this report are preliminary and upon coder review may  be revised to meet current compliance requirements. Justice Britain, MD 07/24/2021 11:02:15 AM Number of Addenda: 0

## 2021-07-24 NOTE — H&P (Signed)
GASTROENTEROLOGY PROCEDURE H&P NOTE   Primary Care Physician: Elby Showers, MD  HPI: Connie Hall is a 55 y.o. female who presents for Colonoscopy/EGD.  Colonoscopy for colon cancer screening in setting of significant family history.  EGD for evaluation prior to bariatric surgery.  Past Medical History:  Diagnosis Date   Back pain    Deviated nasal septum    Discoid lupus    DM (diabetes mellitus) (HCC)    Family history of pancreatic cancer    Family history of prostate cancer    History of discoid lupus erythematosus    Hyperlipidemia    Leg edema    Obesity    OSA (obstructive sleep apnea)    Restless legs syndrome (RLS)    Vitamin D deficiency    Past Surgical History:  Procedure Laterality Date   ROTATOR CUFF REPAIR     left   Sebaceous cyst removal, back  03/2008   Current Facility-Administered Medications  Medication Dose Route Frequency Provider Last Rate Last Admin   0.9 %  sodium chloride infusion   Intravenous Continuous Mansouraty, Telford Nab., MD       lactated ringers infusion   Intravenous Continuous Mansouraty, Telford Nab., MD        Current Facility-Administered Medications:    0.9 %  sodium chloride infusion, , Intravenous, Continuous, Mansouraty, Telford Nab., MD   lactated ringers infusion, , Intravenous, Continuous, Mansouraty, Telford Nab., MD Allergies  Allergen Reactions   Sulfa Antibiotics Hives   Sulfonamide Derivatives Hives   Family History  Problem Relation Age of Onset   Skin cancer Mother    Hyperlipidemia Mother    Hypertension Father    Stroke Father    Heart disease Father    Diabetes Father    Pancreatic cancer Father 64   Prostate cancer Father        dx in his 23s; prostatectomy   Sleep apnea Father    Obesity Father    Pancreatic cancer Sister 14       neuroendocrine tumor   Colon polyps Sister    Colon polyps Sister    Colon polyps Sister    Congestive Heart Failure Maternal Aunt    Dementia Maternal Uncle     Lung cancer Paternal Aunt    Heart attack Paternal Uncle    Pancreatic cancer Paternal Uncle 81   Testicular cancer Cousin        paternal first cousin dx in his 13s   Colon cancer Neg Hx    Esophageal cancer Neg Hx    Inflammatory bowel disease Neg Hx    Liver disease Neg Hx    Stomach cancer Neg Hx    Rectal cancer Neg Hx    Social History   Socioeconomic History   Marital status: Single    Spouse name: Not on file   Number of children: 0   Years of education: Not on file   Highest education level: Not on file  Occupational History   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    Employer: [employer]  Tobacco Use   Smoking status: Former    Packs/day: 1.00    Years: 15.00    Pack years: 15.00    Types: Cigarettes    Quit date: 08/27/1998    Years since quitting: 22.9   Smokeless tobacco: Never  Vaping Use   Vaping Use: Never used  Substance and Sexual Activity   Alcohol use: Yes    Comment: rare   Drug  use: No   Sexual activity: Not on file  Other Topics Concern   Not on file  Social History Narrative   Not on file   Social Determinants of Health   Financial Resource Strain: Not on file  Food Insecurity: Not on file  Transportation Needs: Not on file  Physical Activity: Not on file  Stress: Not on file  Social Connections: Not on file  Intimate Partner Violence: Not on file    Physical Exam: Today's Vitals   07/24/21 0933  TempSrc: Oral  Weight: 106.6 kg  Height: 4' 10.5" (1.486 m)  PainSc: 0-No pain   Body mass index is 48.28 kg/m. GEN: NAD EYE: Sclerae anicteric ENT: MMM CV: Non-tachycardic GI: Soft, NT/ND NEURO:  Alert & Oriented x 3  Lab Results: No results for input(s): WBC, HGB, HCT, PLT in the last 72 hours. BMET No results for input(s): NA, K, CL, CO2, GLUCOSE, BUN, CREATININE, CALCIUM in the last 72 hours. LFT No results for input(s): PROT, ALBUMIN, AST, ALT, ALKPHOS, BILITOT, BILIDIR, IBILI in the last 72 hours. PT/INR No results for  input(s): LABPROT, INR in the last 72 hours.   Impression / Plan: This is a 55 y.o.female who presents for Colonoscopy/EGD.  Colonoscopy for colon cancer screening in setting of significant family history.  EGD for evaluation prior to bariatric surgery.  The risks and benefits of endoscopic evaluation/treatment were discussed with the patient and/or family; these include but are not limited to the risk of perforation, infection, bleeding, missed lesions, lack of diagnosis, severe illness requiring hospitalization, as well as anesthesia and sedation related illnesses.  The patient's history has been reviewed, patient examined, no change in status, and deemed stable for procedure.  The patient and/or family is agreeable to proceed.    Justice Britain, MD [facility] Office # [phone]

## 2021-07-24 NOTE — Anesthesia Postprocedure Evaluation (Signed)
Anesthesia Post Note  Patient: Myrtice Caul  Procedure(s) Performed: COLONOSCOPY WITH PROPOFOL ESOPHAGOGASTRODUODENOSCOPY (EGD) WITH PROPOFOL BIOPSY POLYPECTOMY     Patient location during evaluation: PACU Anesthesia Type: MAC Level of consciousness: awake and alert Pain management: pain level controlled Vital Signs Assessment: post-procedure vital signs reviewed and stable Respiratory status: spontaneous breathing, nonlabored ventilation and respiratory function stable Cardiovascular status: stable and blood pressure returned to baseline Postop Assessment: no apparent nausea or vomiting Anesthetic complications: no   No notable events documented.  Last Vitals:  Vitals:   07/24/21 1110 07/24/21 1120  BP: 127/66 129/84  Pulse: 77 75  Resp: (!) 23 20  Temp:    SpO2: 95% 94%    Last Pain:  Vitals:   07/24/21 1120  TempSrc:   PainSc: 0-No pain                 Ticia Virgo,W. EDMOND

## 2021-07-25 ENCOUNTER — Other Ambulatory Visit (HOSPITAL_COMMUNITY): Payer: Self-pay

## 2021-07-25 LAB — SURGICAL PATHOLOGY

## 2021-07-25 MED FILL — Fluticasone Propionate Nasal Susp 50 MCG/ACT: NASAL | 90 days supply | Qty: 48 | Fill #2 | Status: AC

## 2021-07-26 ENCOUNTER — Encounter: Payer: Self-pay | Admitting: Gastroenterology

## 2021-07-26 ENCOUNTER — Encounter (HOSPITAL_COMMUNITY): Payer: Self-pay | Admitting: Gastroenterology

## 2021-08-01 ENCOUNTER — Ambulatory Visit (INDEPENDENT_AMBULATORY_CARE_PROVIDER_SITE_OTHER): Payer: 59 | Admitting: Internal Medicine

## 2021-08-01 ENCOUNTER — Other Ambulatory Visit: Payer: Self-pay

## 2021-08-01 ENCOUNTER — Encounter: Payer: Self-pay | Admitting: Internal Medicine

## 2021-08-01 VITALS — BP 140/90 | HR 86 | Ht 58.5 in | Wt 248.2 lb

## 2021-08-01 DIAGNOSIS — E1165 Type 2 diabetes mellitus with hyperglycemia: Secondary | ICD-10-CM

## 2021-08-01 DIAGNOSIS — E785 Hyperlipidemia, unspecified: Secondary | ICD-10-CM | POA: Diagnosis not present

## 2021-08-01 NOTE — Progress Notes (Signed)
Patient ID: Connie Hall, female   DOB: [birth year], 55 y.o.   MRN: [id number]   This visit occurred during the SARS-CoV-2 public health emergency.  Safety protocols were in place, including screening questions prior to the visit, additional usage of staff PPE, and extensive cleaning of exam room while observing appropriate contact time as indicated for disinfecting solutions.   HPI: Connie Hall is a 55 y.o.-year-old female, referred by her PCP, Dr. Renold Genta, for management of DM2, dx at least in 2012, without long-term complications and also obesity.  Last visit 4 months ago.  Interim history: She would like to have gastric sleeve surgery with Dr. Thermon Leyland ([facility]).  Preparation for the surgery, she had to have CXR, 2D Echo, colonoscopy and EGD (Dr. Rush Landmark), psychiatric exam, and will have nutrition consult soon. She also had 6 months of health coaching per insurance requirement. No increased urination, blurry vision, nausea, chest pain.  She did lose weight since last visit but gained most of it back after Thanksgiving despite not eating much as she had about last year around that time.  Reviewed history: She was seen in the weight management clinic by Dr. Leafy Ro. Also tried Weight watchers, Optivia, other meal-replacement plans, w/o good results. She gained ~50 lbs in last 5 years.   At our visit from 01/2021, she was contemplating weight loss surgery, which was employed by 2 of her sisters, with success.  Reviewed HbA1c levels: Lab Results  Component Value Date   HGBA1C 5.8 (H) 06/06/2021   HGBA1C 6.2 (A) 01/26/2021   HGBA1C 7.1 (A) 09/27/2020   HGBA1C 6.5 (H) 05/13/2020   HGBA1C 7.0 (A) 02/05/2020   HGBA1C 6.7 (H) 04/21/2019   HGBA1C 6.4 (H) 09/09/2018   HGBA1C 5.8 (H) 03/11/2018   HGBA1C 6.2 (H) 12/25/2017   HGBA1C 6.0 (H) 08/08/2017   HGBA1C 6.1 (H) 02/07/2017   HGBA1C 6.1 (H) 07/26/2016   HGBA1C 6.4 (H) 02/02/2016   HGBA1C 6.1 (H) 08/02/2015   HGBA1C 6.3 (H) 01/18/2015    HGBA1C 6.2 (H) 07/06/2014   HGBA1C 6.0 (H) 01/05/2014   HGBA1C 5.9 (H) 07/03/2013   HGBA1C 5.5 12/30/2012   HGBA1C 5.8 (H) 05/13/2012   She is on: - Ozempic 0.25 >> 0.5 >> 1 mg weekly-she tolerates this well - Metformin 500 >> 1000 mg with dinner-added 09/2020  Pt checks her sugars seldom: - am: 88-120 >> 90-130 (cookie) >> 80-100 >> 90s - 2h after b'fast: n/c  - lunch: n/c - 2h after lunch: 100-115 >> 70-90 >> 110-115 >>  - dinner: n/c - 2h after dinner: 80-100 >> up to 160 >> < or =160 >> 103 - bedtime: n/c Lowest sugar was 80 >> 88 >> 70 >> 90; it is unclear at which level she has hypoglycemia awareness. Highest sugar was 160 >> 160 >> 118.  Glucometer: ?  Pt's meals are:  - Breakfast: Optivia b'fast bar now; prev. Greek yoghurt and 2 boiled eggs - Lunch: fajitas, lasagna, chicken - leftovers >> protein shake + egg + yoghurt + mozarella - Dinner: same as above - home cooked - Snacks: "protein"  -No CKD, last BUN/creatinine:  Lab Results  Component Value Date   BUN 14 06/06/2021   BUN 14 05/13/2020   CREATININE 0.72 06/06/2021   CREATININE 0.86 05/13/2020  She is not on ACE inhibitor or ARB.  -+ Dyslipidemia; last set of lipids: Lab Results  Component Value Date   CHOL 150 06/06/2021   HDL 41 (L) 06/06/2021   [place] 80 06/06/2021  TRIG 192 (H) 06/06/2021   CHOLHDL 3.7 06/06/2021  On Lipitor 10.  - last eye exam was in 11/2020: No DR.+ history of retinal holes.  - she denies numbness and tingling in her feet.  Pt has FH of DM in father (died of pancreatic cancer), 4 sisters.  No h/o pancreatitis or FH of MTC.  She has a history of OSA.  ROS: + see HPI  I reviewed pt's medications, allergies, PMH, social hx, family hx, and changes were documented in the history of present illness. Otherwise, unchanged from my initial visit note.  Past Medical History:  Diagnosis Date   Back pain    Deviated nasal septum    Discoid lupus    DM (diabetes  mellitus) (HCC)    Family history of pancreatic cancer    Family history of prostate cancer    History of discoid lupus erythematosus    Hyperlipidemia    Leg edema    Obesity    OSA (obstructive sleep apnea)    Restless legs syndrome (RLS)    Vitamin D deficiency    Past Surgical History:  Procedure Laterality Date   BIOPSY  07/24/2021   Procedure: BIOPSY;  Surgeon: Irving Copas., MD;  Location: [facility];  Service: Gastroenterology;;   COLONOSCOPY WITH PROPOFOL N/A 07/24/2021   Procedure: COLONOSCOPY WITH PROPOFOL;  Surgeon: Irving Copas., MD;  Location: [facility];  Service: Gastroenterology;  Laterality: N/A;   ESOPHAGOGASTRODUODENOSCOPY (EGD) WITH PROPOFOL N/A 07/24/2021   Procedure: ESOPHAGOGASTRODUODENOSCOPY (EGD) WITH PROPOFOL;  Surgeon: Rush Landmark Telford Nab., MD;  Location: [facility];  Service: Gastroenterology;  Laterality: N/A;   POLYPECTOMY  07/24/2021   Procedure: POLYPECTOMY;  Surgeon: Mansouraty, Telford Nab., MD;  Location: [facility];  Service: Gastroenterology;;   ROTATOR CUFF REPAIR     left   Sebaceous cyst removal, back  03/2008   Social History   Socioeconomic History   Marital status: Single    Spouse name: Not on file   Number of children: 0   Years of education: Not on file   Highest education level: Not on file  Occupational History   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    Employer: [employer]  Tobacco Use   Smoking status: Former    Packs/day: 1.00    Years: 15.00    Pack years: 15.00    Types: Cigarettes    Quit date: 08/27/1998    Years since quitting: 22.9   Smokeless tobacco: Never  Vaping Use   Vaping Use: Never used  Substance and Sexual Activity   Alcohol use: Yes    Comment: rare   Drug use: No   Sexual activity: Not on file  Other Topics Concern   Not on file  Social History Narrative   Not on file   Social Determinants of Health   Financial Resource Strain: Not on file  Food Insecurity: Not  on file  Transportation Needs: Not on file  Physical Activity: Not on file  Stress: Not on file  Social Connections: Not on file  Intimate Partner Violence: Not on file   Current Outpatient Medications on File Prior to Visit  Medication Sig Dispense Refill   ALLEGRA-D ALLERGY & CONGESTION 60-120 MG 12 hr tablet TAKE 1 TABLET BY MOUTH TWICE A DAY (Patient taking differently: Take 1 tablet by mouth daily as needed (allergies).) 180 tablet 2   atorvastatin (LIPITOR) 20 MG tablet Take 1 tablet by mouth daily. 90 tablet 3   Cholecalciferol (VITAMIN D3) 125  MCG (5000 UT) CAPS Take 5,000 Units by mouth daily.     fluticasone (FLONASE) 50 MCG/ACT nasal spray USE 2 SPRAYS IN EACH NOSTRIL ONCE A DAY (Patient taking differently: Place 2 sprays into both nostrils daily as needed for allergies.) 48 g 8   glucose blood test strip Use as instructed 2x a day - for One Touch Verio Reflect 200 each 3   metFORMIN (GLUCOPHAGE) 1000 MG tablet TAKE 1 TABLET BY MOUTH DAILY WITH SUPPER 90 tablet 3   Multiple Vitamins-Minerals (MULTIVITAMIN WITH MINERALS) tablet Take 1 tablet by mouth daily.     Na Sulfate-K Sulfate-Mg Sulf (SUPREP BOWEL PREP KIT) 17.5-3.13-1.6 GM/177ML SOLN Take 1 kit by mouth as directed. 354 mL 0   omeprazole (PRILOSEC) 20 MG capsule Take 1 capsule (20 mg total) by mouth daily. 30 capsule 12   OneTouch Delica Lancets 85O MISC Use 2x a day - for One Touch Verio Reflect 200 each 3   rOPINIRole (REQUIP) 0.5 MG tablet TAKE 1 TABLET BY MOUTH EVERY AFTERNOON AND 2 TABLETS AT BEDTIME (Patient taking differently: Take 1 mg by mouth at bedtime.) 270 tablet 3   Semaglutide, 1 MG/DOSE, (OZEMPIC, 1 MG/DOSE,) 4 MG/3ML SOPN Inject 1 mg into the skin once a week. (Patient taking differently: Inject 1 mg into the skin every Sunday.) 9 mL 3   vitamin C (ASCORBIC ACID) 500 MG tablet Take 500 mg by mouth daily as needed. seasonally     zinc gluconate 50 MG tablet Take 50 mg by mouth daily as needed (seasonal).      No current facility-administered medications on file prior to visit.   Allergies  Allergen Reactions   Sulfa Antibiotics Hives   Sulfonamide Derivatives Hives   Family History  Problem Relation Age of Onset   Skin cancer Mother    Hyperlipidemia Mother    Hypertension Father    Stroke Father    Heart disease Father    Diabetes Father    Pancreatic cancer Father 74   Prostate cancer Father        dx in his 27s; prostatectomy   Sleep apnea Father    Obesity Father    Pancreatic cancer Sister 76       neuroendocrine tumor   Colon polyps Sister    Colon polyps Sister    Colon polyps Sister    Congestive Heart Failure Maternal Aunt    Dementia Maternal Uncle    Lung cancer Paternal Aunt    Heart attack Paternal Uncle    Pancreatic cancer Paternal Uncle 27   Testicular cancer Cousin        paternal first cousin dx in his 43s   Colon cancer Neg Hx    Esophageal cancer Neg Hx    Inflammatory bowel disease Neg Hx    Liver disease Neg Hx    Stomach cancer Neg Hx    Rectal cancer Neg Hx    PE: BP 140/90 (BP Location: Right Arm, Patient Position: Sitting, Cuff Size: Normal)   Pulse 86   Ht 4' 10.5" (1.486 m)   Wt 248 lb 3.2 oz (112.6 kg)   SpO2 97%   BMI 50.99 kg/m  Wt Readings from Last 3 Encounters:  08/01/21 248 lb 3.2 oz (112.6 kg)  07/24/21 235 lb (106.6 kg)  06/15/21 238 lb (108 kg)   Constitutional: overweight, in NAD Eyes: PERRLA, EOMI, no exophthalmos ENT: moist mucous membranes, no thyromegaly, no cervical lymphadenopathy Cardiovascular: RRR, No MRG, + pitting LE edema B  Respiratory: CTA B Musculoskeletal: no deformities, strength intact in all 4 Skin: moist, warm, no rashes Neurological: no tremor with outstretched hands, DTR normal in all 4  ASSESSMENT: 1.  Type 2 diabetes, non-insulin-dependent, recent diagnosis, without complications  2.  Obesity class III  3. HL  PLAN:  1. Patient with history of prediabetes but with more recent diagnosis of  diabetes as an HbA1c returned at 6.7% in 03/2019) 7% in 01/2020.  We will start him on a weekly GLP-1 receptor agonist not only for her diabetes control but also to help with weight loss.  At last visit we increased the dose of Ozempic to 1 mg weekly.  She tolerated this well.  She also continues on metformin.  HbA1c at last visit was better, at 6.2% and blood sugars are excellent.  Since then, she had another HbA1c obtained 2 months ago and this was even lower, at 5.8%.  She will have gastric sleeve in 09/2021.  She had quite a few screening tests and appointments already. -At today's visit, she is not consistently checking blood sugars.  She only checks sporadically and the CBGs are at goal whenever she checks.  I did advise him to start checking consistently.  I also advised her to let me know if her sugars drop too much after surgery. - I suggested to:  Patient Instructions  Please continue: - Metformin 1000 mg with dinner - Ozempic 1 mg weekly   Please return in 6 months with your sugar log.   - advised to check sugars at different times of the day - 1x a day, rotating check times - advised for yearly eye exams >> she is UTD - return to clinic in 6 months  2.  Obesity class III -We will continue Ozempic which should also help with weight loss.  We increased the dose at last visit -She lost 3 pounds before last visit, lost a net 5 lbs since then  3. HL -Reviewed latest/2022: LDL at goal, triglycerides high, HDL low Lab Results  Component Value Date   CHOL 150 06/06/2021   HDL 41 (L) 06/06/2021   LDLCALC 80 06/06/2021   TRIG 192 (H) 06/06/2021   CHOLHDL 3.7 06/06/2021  -Continues Lipitor 10 mg daily without side effects  Philemon Kingdom, MD PhD [facility]

## 2021-08-01 NOTE — Patient Instructions (Signed)
Please continue: - Metformin 1000 mg with dinner - Ozempic 1 mg weekly   Please return in 6 months with your sugar log.

## 2021-08-03 NOTE — Progress Notes (Addendum)
Date of Evaluation: August 03, 2021  Time Seen: 9am Duration: 60 minutes Type of Session: Inittial Appointment for Evaluation  Location of Patient: Work (private location) Location of Provider: At home in a private office due to COVID-19 pandemic Type of Contact: WebEx video visit with audio  Prior to proceeding with today's appointment, two pieces of identifying information were obtained from Connie Hall to verify identity. In addition, Connie Hall's physical location at the time of this appointment was obtained. In the event of technical difficulties, Connie Hall shared a phone number they could be reached at. Connie Hall and this provider participated in today's telepsychological service. Connie Hall denied anyone else being present in the room or on the virtual appointment.The provider's role was explained to Connie Hall. The provider reviewed and discussed issues of confidentiality, privacy, and limits therein (e.g., reporting obligations). In addition to verbal informed consent, written informed consent for psychological services was obtained from Connie Hall prior to the initial intake interview. Written consent included information concerning the practice, financial arrangements, and confidentiality and patients' rights. Since the clinic is not a 24/7 crisis center, mental health emergency resources were shared, and the provider explained MyChart, e-mail, voicemail, and/or other messaging systems should be utilized only for non-emergency reasons. This provider also explained that information obtained during appointments will be placed in their electronic medical chart in a confidential manner. Connie Hall verbally acknowledged understanding of the aforementioned and agreed to use mental health emergency resources discussed if needed. Moreover, Connie Hall agreed information may be shared with other [facility] providers as needed for coordination of care. By signing the new patient documents, Connie Hall provided  written consent for coordination of care. Connie Hall verbally acknowledged understanding she is ultimately responsible for understanding her insurance benefits as it relates to reimbursement of telepsychological and in-person services. This provider also reviewed confidentiality, as it relates to telepsychological services, as well as the rationale for telepsychological services. More specifically, this provider's clinic is providing telepsychological services to reduce exposure to COVID-19. The patient expressed understanding regarding the rationale for telepsychological services. Connie Hall verbally consented to proceed.  Connie Hall completed the psychiatric diagnostic evaluation (complete history, including past, family, and social history, psychiatric history, and establishment of a tentative diagnosis) and the bariatric assessment for a total of 60 minutes. Code [phone] was billed.   Additionally, during this initial appointment this provider completed the scoring of the following measures: Beck Anxiety Inventory (BAI) (5 minutes), Beck's Depression Index (5 minutes), Eating Disorder Diagnostic Scale (EDDS) (10 minutes [administration and scoring]), Mini Mental Status Examination (MMSE) (10 minutes [administration and scoring]), & Mood Disorder Questionnaire (MDQ) (5 minutes [administration and scoring]). A total of 35 minutes was spent on the scoring of the aforementioned measures and code [phone] was billed.   Mental Status Examination:  Appearance:  neat Behavior: appropriate to circumstances Mood: euthymic Affect: mood congruent Speech: WNL Eye Contact: appropriate Psychomotor Activity: WNL Thought Process: linear, logical, and goal directed and denies suicidal, homicidal, and self-harm ideation, plan and intent Content/Perceptual Disturbances: none Orientation: AAOx4 Cognition/Sensorium: intact Insight: good Judgment: good  Time Requirements: Interview: 60 minutes (billing code [phone]) Assessment  scoring and interpreting: 35 minutes (billing code [phone])  DSM-5 Diagnosis(es) Code: E66.01 Morbid Obesity  Plan: Connie Hall is currently scheduled for a feedback appointment on 08/17/21 at 2pm via WebEx video visit with audio.    Bariatric Evaluation:    Reason for Referral: Connie Hall reported she was referred for an evaluation to determine if she has "the mental capacity to go  through the bariatric surgery procedure" and is "mentally fit and stable." Per referral paperwork, she is "interested in" sleeve gastrectomy.   Sources of Information Clinical Interview Bariatric Questionnaire [place] Interview for Gastric Bypass Beck Anxiety Inventory (BAI) Beck Depression Inventory, 2nd Edition (BDI-II) Eating Disorder Diagnostic Scale (EDDS) Mini-Mental State Examination (MMSE)  Mood Disorder Questionnaire (MDQ) Review of Medical Record (provided by [facility])  Patient Identification and Chief Complaint: Connie Hall currently resides in [place], [place], noting she lives alone. She stated her highest level of education is a bachelor's degree in finance. She denied a history of learning diagnosis or grade retentions. 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   Connie Hall discussed a belief weight loss may help with greater mobility and flexibility, increased energy, and improved self-image.  She shared her social support system consists of her sister, sister's husband, and friends. Connie Hall reported a belief there would be no change to her relationships if she were to lose weight, adding everyone is "supportive" of her pursuit of bariatric surgery. She described herself as the primary cook in the house.    Connie Hall and referral paperwork reported her medical history is significant for the following: obstructive sleep apnea, hyperlipidemia, and diabetes.   Current Medications: None noted.   Connie Hall reported occasional employment  stressors, noting stress can cause emotional eating-related behaviors. She expressed awareness of the importance of ceasing this behavior prior to bariatric surgery as well as openness to attending mental health services to assist in doing so if needed. She denied ever experiencing a depressive episode; significant and uncontrollable anxiety; panic attacks; abuse or neglect; psychiatric hospitalization. She also denied ever attending mental health services. She reported social use of alcohol approximately once a month, noting confidence in her ability to cease its use prior to bariatric surgery. She denied use of any other recreational or illicit substances. She reported her family mental health history is significant for schizophrenia (aunt) and alcohol abuse (two uncles).   Patient's Understanding of the Procedure/Risks of Surgery: Connie Hall discussed her sister previously had bariatric surgery and has been "notifying" her about the surgery process. She added she has been "talking it over with family" and "researching." Connie Hall stated she is "possibly" pursuing a gastric sleevectomy which involves "taking a portion of your stomach and moving it so that portion is no longer usable." She described awareness of anesthesia being administered during the procedure and noted the surgery can cause death, "[the surgeon] nicking something," sepsis, "rupture," bleeding, nutritional deficiencies, constipation, and heart burn. Connie Hall reported there would be a postoperative hospital stay and she would be unable to return to work/daily activities for two-to-three weeks. She reported her primary motivations for the procedure include increased day-to-day mobility and improved health. She expressed an expectation to lose 80-90 pounds following the surgery, noting maximum weight loss could take one-to-two years.    Connie Hall reported confidence in her ability to implement food restrictions following gastric surgery,  noting she has been using "bariatric surgery vitamins" and protein shakes, finding appropriate high fiber food options, speaking with the nutritionist, talking with her sister that previously underwent bariatric surgery, trying out different recipes and appropriate food options, ceasing use of fried foods, and has plans to set timers on her phone to ensure she eats consistently throughout the day. She expressed confidence in her ability to obtain and prepare food. Connie Hall reported a belief her living environment would  assist her in her attempts to control her eating. Following surgery, she reported awareness she will be primarily consuming liquids and portion sizes will be small. When able to start eating solid foods, she indicated she would have to consume lean meats, high protein and fiber options, vegetables, and fruits. Connie Hall also reported awareness she would have to avoid "red meats," fried and sugary foods, alcohol, and carbonated beverages. Optimally, she indicated she would consume several small meals and snacks throughout the day, chew her food slowly and thoroughly, and not drink beverages during a meal.  History of Weight Gains and Losses: Ms. Castronovo described being fairly inactive, noting during workdays she is sedentary. She described her past weight loss efforts as including low carb diet "on and off" for years (not helpful), Optivia eight years ago (helpful), Weight Watchers four-to-five years ago (helpful), physical trainer four years ago (helpful), [facility] four years ago (helpful), and [employer] three years ago (helpful).   Eating Behaviors: Ms. Eversley denied a history of binge- and purge-related behaviors. She reported, eating when not physically hungry daily, drinking one 16.9oz Diet Pepsi a week, and consuming 48oz of water daily.   Current Diet and Plans for the Future:  Breakfast Yogurt, trail mix, and eggs. Lunch Hamburger, salad, and  cookie. Dinner Rice, chicken, and vegetables.  Snacks   Mental Status Examination: Connie Hall presented on time to the session and completed all the necessary paperwork. She was dressed casually, and her hygiene was observed to be good. Connie Hall was oriented to person, Hall, time, and purpose of appointment. Her attitude was cooperative and cheerful. There were no unusual psychomotor movements or changes. Speech patterns were normal in rate, tone, volume, and without pressure. Affect was reactive and mood congruent. Thought processes were goal-directed and logical. Insight and judgement were good. The Mini-Mental State Examination (MMSE) was administered. She scored a 29/30, which is indicative of normal cognitive functioning.    Psychological Functioning: Connie Hall completed the Mood Disorder Questionnaire (MDQ). She scored a 0/13 and denied several of the endorsed symptoms have occurred during the same period or have caused any problems, which is a negative screening for bipolar-related disorder. On the Beck Anxiety Inventory (BAI), Connie Hall scored a 7/63, which is indicative of minimal anxiety symptomatology. On the Beck Depression Inventory (BDI-II), Connie Hall scored a 2/63, which is indicative of normal mood fluctuations. The Eating Disorder Diagnostic Scale (EDDS) was administered. Ms. Lynnea Maizes indicated her weight and shape have impacted how she views herself as a person. She endorsed eating an unusually large amount of food and experiencing a loss of control approximately twice per week over the past six months, noting during these periods she ate large amounts of food when not physically hungry and felt negatively about the overeating. She denied experiencing any other problematic eating behaviors.   Conclusions & Recommendations: Ms. Sible Straley is a 55 year old female who was referred to the [place] division by Dr. Louanna Raw at [facility] for a psychological evaluation to determine her suitability for bariatric surgery.   Regarding bariatric surgery, Ms. Peckenpaugh appears to be highly motivated and has a good understanding of the bariatric surgery as well as risks and lifestyle changes needed to promote post-surgical weight loss success. Results of this evaluation yielded a history of emotional eating-related behaviors, social use of alcohol approximately once a month, use of a 16.9oz diet soda a week, and a frequently sedentary lifestyle. She  expressed awareness of the importance of ceasing emotional eating-related behaviors, use of alcohol, and diet soda, stating she is open to attending mental health services to assist her in doing so. She also expressed awareness of the importance of increasing her daily exercise, stating plans to increasingly utilize her employment's gym. This Probation officer reinforced her plans to utilize mental health services to assist her in the aforementioned. At this time, Ms. Mitter appears to be able to make an informed decision about the surgery she is contemplating. She appears to be motivated and expressed understanding of the post-surgical requirements. If Ms. Few's surgery is scheduled more than three months from today's date (08/07/21), she is required to schedule a follow-up visit for this examiner to briefly re-evaluate her psychological status at that time. This follow-up visit should occur within two months of her scheduled surgery date.  The following recommendations are offered to promote Ms. Lambright's health and well-being:  1. It is recommended Ms. Purkey participate in educational sessions regarding a healthy diet and post-operative meal planning with a dietician or other health care providers.   2. Re-evaluation. If Ms. Betancur's surgery is scheduled more than three months from her date of approval, she is required to contact our office [phone]) for a brief check-in appointment within two  months from her surgery date.   3. Nutritional Counseling. Ms. Majewski is strongly encouraged to continue attending nutritional counseling appointments in order to plan a healthy diet and post-operative meals. She is encouraged to make recommended changes to her diet prior to surgery in order to increase the chances of continuing a healthy diet after surgery.   4. Exercise. Ms. Dickens is encouraged to participate in educational sessions on exercise that will be appropriate for her medical conditions and support her weight loss plans in a safe and healthy manner. Specifically, she is encouraged to consider participating in the Bariatric Exercise and Lifestyle Transformation (BELT) program, a partnership between [place]. There, she will join fellow [facility] bariatric patients three times a week for personalized aerobic and strength training instruction, as well as educational sessions on diet, exercise and behavior modification strategies. BELT meets at [place] at [employer].  [email or web address]  5. Support groups. Ms. Hirth is encouraged to join a support group to give her encouragement as she faces the psychological adjustments of bariatric surgery and the need to significantly adjust one's meals and food choices. A list of support groups offered through [facility] can be found through the bariatrics department website: MetroMeds.nl  6. Self-help resources.  a. To develop strategies for managing emotional difficulties encountered before and after weight loss surgery, the patient is encouraged to read The Emotional First + Aid Kit: A Practical Guide to Life After Bariatric Surgery, Second Edition by Caren Griffins L. Sheppard Coil, PsyD. Examples of strategies discussed in this book include relieving stress without using food, developing and maintaining  an exercise program, preventing relapse, etc. b. Ms. Watterson is strongly encouraged to practice mindful eating, the goal of which is to pay close attention to the smell, sight, taste, temperature, texture, etc. of food. Eating mindfully helps to eat slower while enjoying food more fully. Useful books on mindful eating include Savor: Mindful Eating, Mindful Life and How to Eat, both by mindfulness expert Thich Nhat Hanh.  7. Mental health treatment. For additional support either prior to or after the surgery, Ms. Vanbrocklin may consider seeking the care of a therapist (counselor, psychologist) in order to  develop skills for coping with the adjustment to a new lifestyle. Available therapists include other clinicians within the [facility] [phone]). She may also seek other in-network providers in the community by searching online at DustingSprays.pl.   8. General recommendations for bariatric surgery: a. Replace the habit of late-night snacking with something else (e.g., chewing gum, drinking water, or a relaxing activity like reading or crosswords that occupies your hands) and consider going to bed earlier.  b. Practice eating 4-6 meals per day. Each meal should last about 20 minutes. c. Practice drinking liquids 30 minutes before or after meals. d. Keep a food diary for 1 week. Record all foods eaten during the day, including snacks and drinks. Be very specific and very honest.  e. Get into the habit of reading food labels to evaluate content of protein, sugars, carbohydrates, sodium, etc.  f. Continue to eat lots of vegetables.  g. Prepare meals at home, rather than take-out or fast food.  h. Take multivitamins including zinc and iron.  i. Develop exercise plans, including a low-impact and safe exercise plan to start 4-5 weeks into recovery, and a more intensive exercise plan for later.  j. Determine who will take care of any major responsibilities (particularly those  involving physical activity, such as childcare) in the early stages of your recovery.  k. Educate family and friends who will be involved in your recovery about the extent and importance of your new lifestyle changes. The more they know, the better they can support you and help you stay on track!

## 2021-08-04 ENCOUNTER — Ambulatory Visit (INDEPENDENT_AMBULATORY_CARE_PROVIDER_SITE_OTHER): Payer: 59 | Admitting: Psychology

## 2021-08-10 ENCOUNTER — Other Ambulatory Visit: Payer: Self-pay

## 2021-08-10 ENCOUNTER — Encounter: Payer: 59 | Attending: Surgery | Admitting: Skilled Nursing Facility1

## 2021-08-10 ENCOUNTER — Encounter: Payer: Self-pay | Admitting: Skilled Nursing Facility1

## 2021-08-10 NOTE — Progress Notes (Signed)
Nutrition Assessment for Bariatric Surgery Medical Nutrition Therapy Appt Start Time: 10:36    End Time: 11:30  Patient was seen on 08/10/2021 for Pre-Operative Nutrition Assessment. Letter of approval faxed to [facility] bariatric surgery program coordinator on 08/10/2021.   Referral stated Supervised Weight Loss (SWL) visits needed: 0  Planned surgery: RYGB Pt expectation of surgery: to lose weight Pt expectation of dietitian: none stated    NUTRITION ASSESSMENT   Anthropometrics  Start weight at [facility]: 251.4 lbs (date: 08/10/2021)  Height: 4 foot 11 in BMI: 50.78 kg/m2     Clinical  Medical hx: DM, hyperlipemia, discoid lupus Medications: ozempic, metformin  Labs: A1C 5.8, HDL 41, Triglycerides 192 Notable signs/symptoms: N/A Any previous deficiencies? Vitamin D, Low iron  Micronutrient Nutrition Focused Physical Exam: Hair: No issues observed Eyes: No issues observed Mouth: No issues observed Neck: No issues observed Nails: No issues observed Skin: No issues observed  Lifestyle & Dietary Hx  Pt states she is currently going through menopause so has hot flashes.   Pt states she checks her blood sugars about 2 times a week: 90 fasting and post prandial about 120. Pt states she is an emotional eater and is ready to work on this behavior.   24-Hr Dietary Recall First Meal:  Snack 10: pack of crackers Second Meal: skipped Snack: christmas cookies  Third Meal: chicken and beef and salad and peppers and zucchini and sweet potato Snack:  Beverages: protein shake, water, soda   Estimated Energy Needs Calories: 1500   NUTRITION DIAGNOSIS  Overweight/obesity ([place]-3.3) related to past poor dietary habits and physical inactivity as evidenced by patient w/ planned RYGB surgery following dietary guidelines for continued weight loss.    NUTRITION INTERVENTION  Nutrition counseling (C-1) and education (E-2) to facilitate bariatric surgery goals.  Educated  pt on micronutrient deficiencies post surgery and strategies to mitigate that risk   Pre-Op Goals Reviewed with the Patient Track food and beverage intake (pen and paper, MyFitness Pal, Baritastic app, etc.) Make healthy food choices while monitoring portion sizes Consume 3 meals per day or try to eat every 3-5 hours Avoid concentrated sugars and fried foods Keep sugar & fat in the single digits per serving on food labels Practice CHEWING your food (aim for applesauce consistency) Practice not drinking 15 minutes before, during, and 30 minutes after each meal and snack Avoid all carbonated beverages (ex: soda, sparkling beverages)  Limit caffeinated beverages (ex: coffee, tea, energy drinks) Avoid all sugar-sweetened beverages (ex: regular soda, sports drinks)  Avoid alcohol  Aim for 64-100 ounces of FLUID daily (with at least half of fluid intake being plain water)  Aim for at least 60-80 grams of PROTEIN daily Look for a liquid protein source that contains ?15 g protein and ?5 g carbohydrate (ex: shakes, drinks, shots) Make a list of non-food related activities Physical activity is an important part of a healthy lifestyle so keep it moving! The goal is to reach 150 minutes of exercise per week, including cardiovascular and weight baring activity.  *Goals that are bolded indicate the pt would like to start working towards these  Handouts Provided Include  Bariatric Surgery handouts (Nutrition Visits, Pre-Op Goals, Protein Shakes, Vitamins & Minerals) Should I eat Mindful Meals  Meal ideas  Learning Style & Readiness for Change Teaching method utilized: Visual & Auditory  Demonstrated degree of understanding via: Teach Back  Readiness Level: Action Barriers to learning/adherence to lifestyle change: none identified    MONITORING & EVALUATION  Dietary intake, weekly physical activity, body weight, and pre-op goals reached at next nutrition visit.    Next Steps  Patient is to  follow up at [facility] for Pre-Op Class >2 weeks before surgery for further nutrition education.

## 2021-08-17 ENCOUNTER — Ambulatory Visit (INDEPENDENT_AMBULATORY_CARE_PROVIDER_SITE_OTHER): Payer: 59 | Admitting: Psychology

## 2021-08-17 DIAGNOSIS — F509 Eating disorder, unspecified: Secondary | ICD-10-CM

## 2021-08-17 NOTE — Progress Notes (Signed)
Date of Appointment: 08/17/2021  Time Seen: 2pm Duration: 20 minutes Type of Session: Follow-up Appointment for Evaluation  Location of Patient: Work (private location) Location of Provider: At home in a private office due to COVID-19 pandemic Type of Contact: WebEx video visit with audio  [name] participated in the session via WebEx video visit with audio, from the privacy of their home due to the COVID-19 pandemic. [name] provided verbal consent to proceed with today's appointment. This provider participated from a private home office. Today's interactive feedback session was completed which included reviewing the following measures: Beck Anxiety Inventory (BAI), Becks Depression Index (BDI), Eating Disorder Diagnostic Scale (EDDS), Mental Status Examination (MSE), & Mood Disorder Questionnaire (MDQ). During this interactive feedback session, this provider and [name] discussed the results of the aforementioned measures, treatment recommendations, and the final determination regarding the psychological approval for bariatric surgery.   Please see the bariatric assessment for additional details. This provider completed the written report which includes integration of patient data, interpretation of standardized test results, interpretation of clinical data, review of referral from surgeon & clinical decision making (95 minutes in total).  The interactive feedback session was completed today and a total of 20 minutes was spent on feedback and report writing. No billing code for the feedback appointment.  Mental Status Examination:  Appearance:  neat Behavior: appropriate to circumstances Mood: euthymic Affect: mood congruent Speech: WNL Eye Contact: appropriate Psychomotor Activity: WNL Thought Process: linear, logical, and goal directed and denies suicidal, homicidal, and self-harm ideation, plan and intent Content/Perceptual Disturbances: none Orientation: AAOx4 Cognition/Sensorium:  intact Insight: good Judgment: good  Time Requirements: Feedback: 20 total minutes (no billing code) Report writing: 95 minutes (billing codes 96130 and [phone])  DSM-5 Diagnosis(es) code: E66.01 Morbid Obesity  Plan: [name] provided verbal consent for her evaluation to be sent to [facility]. No further follow-up planned by this provider.     Bariatric Evaluation    CONFIDENTIAL    Client Name: [name]                       MRN: [id number] Date of Birth:  [birth year]                                                             Date of Evaluation: 08/04/2021 Total Assessment Time:  Minutes                                              Date of Report: 08/17/2021 Evaluator: Clarice Pole, Psy.D.                                 Referring Physician: Dr. Louanna Raw, [facility]  Reason for Referral: [name] reported she was referred for an evaluation to determine if she has the mental capacity to go through the bariatric surgery procedure and is mentally fit and stable. Per referral paperwork, she is interested in sleeve gastrectomy.   Sources of Information Clinical Interview Bariatric Questionnaire [place] Interview for Gastric Bypass Beck Anxiety Inventory (BAI) Beck Depression Inventory, 2nd Edition (BDI-II)  Eating Disorder Diagnostic Scale (EDDS) Mini-Mental State Examination (MMSE)  Mood Disorder Questionnaire (MDQ) Review of Medical Record (provided by [facility])  Patient Identification and Chief Complaint: [name] currently resides in [place], [place], noting she lives alone. She stated her highest level of education is a bachelor's degree in finance. She denied a history of learning diagnosis or grade retentions. 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   Ms. Fernando discussed a belief weight loss may help with greater mobility and flexibility, increased energy, and improved  self-image.  She shared her social support system consists of her sister, sister's husband, and friends. [name] reported a belief there would be no change to her relationships if she were to lose weight, adding everyone is supportive of her pursuit of bariatric surgery. She described herself as the primary cook in the house.    [name] and referral paperwork reported her medical history is significant for the following: obstructive sleep apnea, hyperlipidemia, and diabetes.   Current Medications: None noted.   [name] reported occasional employment stressors, noting stress can cause emotional eating-related behaviors. She expressed awareness of the importance of ceasing this behavior prior to bariatric surgery as well as openness to attending mental health services to assist in doing so if needed. She denied ever experiencing a depressive episode; significant and uncontrollable anxiety; panic attacks; abuse or neglect; psychiatric hospitalization. She also denied ever attending mental health services. She reported social use of alcohol approximately once a month, noting confidence in her ability to cease its use prior to bariatric surgery. She denied use of any other recreational or illicit substances. She reported her family mental health history is significant for schizophrenia (aunt) and alcohol abuse (two uncles).   Patient's Understanding of the Procedure/Risks of Surgery: [name] discussed her sister previously had bariatric surgery and has been notifying her about the surgery process. She added she has been talking it over with family and researching. [name] stated she is possibly pursuing a gastric sleevectomy which involves taking a portion of your stomach and moving it so that portion is no longer usable. She described awareness of anesthesia being administered during the procedure and noted the surgery can cause death, [the surgeon] nicking something, sepsis,  rupture, bleeding, nutritional deficiencies, constipation, and heart burn. [name] reported there would be a postoperative hospital stay and she would be unable to return to work/daily activities for two-to-three weeks. She reported her primary motivations for the procedure include increased day-to-day mobility and improved health. She expressed an expectation to lose 80-90 pounds following the surgery, noting maximum weight loss could take one-to-two years.    Ms. Nussbaumer reported confidence in her ability to implement food restrictions following gastric surgery, noting she has been using bariatric surgery vitamins and protein shakes, finding appropriate high fiber food options, speaking with the nutritionist, talking with her sister that previously underwent bariatric surgery, trying out different recipes and appropriate food options, ceasing use of fried foods, and has plans to set timers on her phone to ensure she eats consistently throughout the day. She expressed confidence in her ability to obtain and prepare food. Ms. Ronning reported a belief her living environment would assist her in her attempts to control her eating. Following surgery, she reported awareness she will be primarily consuming liquids and portion sizes will be small. When able to start eating solid foods, she indicated she would have to consume lean meats, high protein  and fiber options, vegetables, and fruits. Ms. Gutter also reported awareness she would have to avoid red meats, fried and sugary foods, alcohol, and carbonated beverages. Optimally, she indicated she would consume several small meals and snacks throughout the day, chew her food slowly and thoroughly, and not drink beverages during a meal.  History of Weight Gains and Losses: Ms. Steidle described being fairly inactive, noting during workdays she is sedentary. She described her past weight loss efforts as including low carb diet on and off for years (not  helpful), Optivia eight years ago (helpful), Weight Watchers four-to-five years ago (helpful), physical trainer four years ago (helpful), [facility] [place] four years ago (helpful), and [employer] three years ago (helpful).   Eating Behaviors: Ms. Truax denied a history of binge- and purge-related behaviors. She reported, eating when not physically hungry daily, drinking one 16.9oz Diet Pepsi a week, and consuming 48oz of water daily.   Current Diet and Plans for the Future:  Breakfast Yogurt, trail mix, and eggs.  Lunch Hamburger, salad, and cookie.  Dinner Rice, chicken, and vegetables.   Snacks    Mental Status Examination: Ms. Gritz presented on time to the session and completed all the necessary paperwork. She was dressed casually, and her hygiene was observed to be good. Ms. Bilotta was oriented to person, place, time, and purpose of appointment. Her attitude was cooperative and cheerful. There were no unusual psychomotor movements or changes. Speech patterns were normal in rate, tone, volume, and without pressure. Affect was reactive and mood congruent. Thought processes were goal-directed and logical. Insight and judgement were good. The Mini-Mental State Examination (MMSE) was administered. She scored a 29/30, which is indicative of normal cognitive functioning.    Psychological Functioning: Ms. Valenza completed the Mood Disorder Questionnaire (MDQ). She scored a 0/13 and denied several of the endorsed symptoms have occurred during the same period or have caused any problems, which is a negative screening for bipolar-related disorder. On the Beck Anxiety Inventory (BAI), Ms. Folz scored a 7/63, which is indicative of minimal anxiety symptomatology. On the Beck Depression Inventory (BDI-II), Ms. Gary scored a 2/63, which is indicative of normal mood fluctuations. The Eating Disorder Diagnostic Scale (EDDS) was administered. Ms. Lynnea Maizes indicated her weight and  shape have impacted how she views herself as a person. She endorsed eating an unusually large amount of food and experiencing a loss of control approximately twice per week over the past six months, noting during these periods she ate large amounts of food when not physically hungry and felt negatively about the overeating. She denied experiencing any other problematic eating behaviors.   Conclusions & Recommendations: Ms. Atiyah Bauer is a 55 year old female who was referred to the [place] division by Dr. Louanna Raw at [facility] for a psychological evaluation to determine her suitability for bariatric surgery.   Regarding bariatric surgery, Ms. Farnell appears to be highly motivated and has a good understanding of the bariatric surgery as well as risks and lifestyle changes needed to promote post-surgical weight loss success. Results of this evaluation yielded a history of emotional eating-related behaviors, social use of alcohol approximately once a month, use of a 16.9oz diet soda a week, and a frequently sedentary lifestyle. She expressed awareness of the importance of ceasing emotional eating-related behaviors, use of alcohol, and diet soda, stating she is open to attending mental health services to assist her in doing so. She also expressed awareness of the importance of increasing  her daily exercise, stating plans to increasingly utilize her employment's gym. This Probation officer reinforced her plans to utilize mental health services to assist her in the aforementioned.   08/17/2021 Addendum: Ms. Axe denied experiencing any significant issues or concerns since last meeting with this evaluator. She shared plans to seek alternative options for nutritional services and expressed interest in any resources this evaluator has that will assist her throughout the bariatric surgery process. She expressed feeling "ready for bariatric surgery as well as noted ongoing  confidence in her ability to follow all dietary requirements and openness to utilizing mental health services if needed.   At this time, Ms. Kuehne appears to be able to make an informed decision about the surgery she is contemplating. She appears to be motivated and expressed understanding of the post-surgical requirements. If Ms. Kalp's surgery is scheduled more than three months from today's date (08/17/21), she is required to schedule a follow-up visit for this examiner to briefly re-evaluate her psychological status at that time. This follow-up visit should occur within two months of her scheduled surgery date.  The following recommendations are offered to promote Ms. Spurr's health and well-being:  It is recommended Ms. Vallo participate in educational sessions regarding a healthy diet and post-operative meal planning with a dietician or other health care providers.   Re-evaluation. If Ms. Hett's surgery is scheduled more than three months from her date of approval, she is required to contact our office [phone]) for a brief check-in appointment within two months from her surgery date.   Nutritional Counseling. Ms. Niday is strongly encouraged to continue attending nutritional counseling appointments in order to plan a healthy diet and post-operative meals. She is encouraged to make recommended changes to her diet prior to surgery in order to increase the chances of continuing a healthy diet after surgery.   Exercise. Ms. Hawbaker is encouraged to participate in educational sessions on exercise that will be appropriate for her medical conditions and support her weight loss plans in a safe and healthy manner. Specifically, she is encouraged to consider participating in the Bariatric Exercise and Lifestyle Transformation (BELT) program, a partnership between [facility]. There, she will join fellow [facility] bariatric patients three times a week for personalized aerobic  and strength training instruction, as well as educational sessions on diet, exercise and behavior modification strategies. BELT meets at [place] at [employer].  [employer]  Support groups. Ms. Desantiago is encouraged to join a support group to give her encouragement as she faces the psychological adjustments of bariatric surgery and the need to significantly adjust one's meals and food choices. A list of support groups offered through [facility] can be found through the bariatrics department website: [facility]Meds.nl  Self-help resources.  To develop strategies for managing emotional difficulties encountered before and after weight loss surgery, the patient is encouraged to read The Emotional First + Aid Kit: A Practical Guide to Life After Bariatric Surgery, Second Edition by Caren Griffins L. Sheppard Coil, PsyD. Examples of strategies discussed in this book include relieving stress without using food, developing and maintaining an exercise program, preventing relapse, etc. Ms. Ciampa is strongly encouraged to practice mindful eating, the goal of which is to pay close attention to the smell, sight, taste, temperature, texture, etc. of food. Eating mindfully helps to eat slower while enjoying food more fully. Useful books on mindful eating include Savor: Mindful Eating, Mindful Life and How to Eat, both by mindfulness expert Thich Nhat  Hanh.  Mental health treatment. For additional support either prior to or after the surgery, Ms. Allbaugh may consider seeking the care of a therapist (counselor, psychologist) in order to develop skills for coping with the adjustment to a new lifestyle. Available therapists include other clinicians within the [facility] [phone]). She may also seek other in-network providers in the community by searching  online at DustingSprays.pl.   General recommendations for bariatric surgery: Replace the habit of late-night snacking with something else (e.g., chewing gum, drinking water, or a relaxing activity like reading or crosswords that occupies your hands) and consider going to bed earlier.  Practice eating 4-6 meals per day. Each meal should last about 20 minutes. Practice drinking liquids 30 minutes before or after meals. Keep a food diary for 1 week. Record all foods eaten during the day, including snacks and drinks. Be very specific and very honest.  Get into the habit of reading food labels to evaluate content of protein, sugars, carbohydrates, sodium, etc.  Continue to eat lots of vegetables.  Prepare meals at home, rather than take-out or fast food.  Take multivitamins including zinc and iron.  Develop exercise plans, including a low-impact and safe exercise plan to start 4-5 weeks into recovery, and a more intensive exercise plan for later.  Determine who will take care of any major responsibilities (particularly those involving physical activity, such as childcare) in the early stages of your recovery.  Educate family and friends who will be involved in your recovery about the extent and importance of your new lifestyle changes. The more they know, the better they can support you and help you stay on track!

## 2021-08-23 ENCOUNTER — Ambulatory Visit: Payer: 59

## 2021-08-23 NOTE — Patient Instructions (Addendum)
Order placed for mammogram.  Will be seeing gastroenterologist soon for colon cancer screening and discussion regarding pancreatic cancer.  Has had negative genetic testing for pancreatic cancer through oncology.  Continue statin medication for hyperlipidemia.  Continue treatment by endocrinologist for diabetes mellitus.  Return in 1 year or as needed.

## 2021-08-28 ENCOUNTER — Other Ambulatory Visit (HOSPITAL_COMMUNITY): Payer: Self-pay

## 2021-09-01 DIAGNOSIS — E785 Hyperlipidemia, unspecified: Secondary | ICD-10-CM | POA: Diagnosis not present

## 2021-09-01 DIAGNOSIS — G4733 Obstructive sleep apnea (adult) (pediatric): Secondary | ICD-10-CM | POA: Diagnosis not present

## 2021-09-01 DIAGNOSIS — E1169 Type 2 diabetes mellitus with other specified complication: Secondary | ICD-10-CM | POA: Diagnosis not present

## 2021-09-04 ENCOUNTER — Encounter: Payer: 59 | Attending: Surgery | Admitting: Skilled Nursing Facility1

## 2021-09-04 ENCOUNTER — Ambulatory Visit
Admission: RE | Admit: 2021-09-04 | Discharge: 2021-09-04 | Disposition: A | Discharge status: Home / Self Care | Payer: 59 | Source: Ambulatory Visit | Attending: Internal Medicine | Admitting: Internal Medicine

## 2021-09-04 ENCOUNTER — Other Ambulatory Visit: Payer: Self-pay

## 2021-09-04 DIAGNOSIS — Z1231 Encounter for screening mammogram for malignant neoplasm of breast: Secondary | ICD-10-CM

## 2021-09-04 NOTE — Progress Notes (Signed)
Pre-Operative Nutrition Class:    Patient was seen on 09/04/2021 for Pre-Operative Bariatric Surgery Education at [facility].    Surgery date:  Surgery type: RYGB Start weight at [facility]: 251.4 Weight today: 252.9  Samples given per MNT protocol. Patient educated on appropriate usage: Ensure max exp: October 25, 2021 Ensure max lot: [id number]  Chewable bariatric advantage: advanced multi EA exp: 08/23 Chewable bariatric advantage: advanced multi EA lot: [id number]  Bariatric advantage calcium citrate exp: 02/23 Bariatric advantage calcium citrate lot: [id number]   The following the learning objectives were met by the patient during this course: Identify Pre-Op Dietary Goals and will begin 2 weeks pre-operatively Identify appropriate sources of fluids and proteins  State protein recommendations and appropriate sources pre and post-operatively Identify Post-Operative Dietary Goals and will follow for 2 weeks post-operatively Identify appropriate multivitamin and calcium sources Describe the need for physical activity post-operatively and will follow MD recommendations State when to call healthcare provider regarding medication questions or post-operative complications When having a diagnosis of diabetes understanding hypoglycemia symptoms and the inclusion of 1 complex carbohydrate per meal  Handouts given during class include: Pre-Op Bariatric Surgery Diet Handout Protein Shake Handout Post-Op Bariatric Surgery Nutrition Handout BELT Program Information Flyer Support Group Information Flyer [facility] Bariatric Supplements Price List  Follow-Up Plan: Patient will follow-up at [facility] 2 weeks post operatively for diet advancement per MD.

## 2021-09-18 ENCOUNTER — Other Ambulatory Visit: Payer: Self-pay | Admitting: Internal Medicine

## 2021-09-18 ENCOUNTER — Other Ambulatory Visit (HOSPITAL_COMMUNITY): Payer: Self-pay

## 2021-09-18 DIAGNOSIS — J00 Acute nasopharyngitis [common cold]: Secondary | ICD-10-CM

## 2021-09-18 MED ORDER — FLUTICASONE PROPIONATE 50 MCG/ACT NA SUSP
2.0000 | Freq: Every day | NASAL | 1 refills | Status: DC
  Filled 2021-09-18 – 2022-01-15 (×2): qty 48, 90d supply, fill #0
  Filled 2022-04-19: qty 48, 90d supply, fill #1

## 2021-09-19 ENCOUNTER — Other Ambulatory Visit (HOSPITAL_COMMUNITY): Payer: Self-pay

## 2021-10-02 DIAGNOSIS — G4733 Obstructive sleep apnea (adult) (pediatric): Secondary | ICD-10-CM | POA: Diagnosis not present

## 2021-10-19 ENCOUNTER — Encounter: Payer: Self-pay | Admitting: Internal Medicine

## 2021-10-19 ENCOUNTER — Other Ambulatory Visit (HOSPITAL_COMMUNITY): Payer: Self-pay

## 2021-10-19 DIAGNOSIS — L821 Other seborrheic keratosis: Secondary | ICD-10-CM | POA: Diagnosis not present

## 2021-10-19 DIAGNOSIS — L814 Other melanin hyperpigmentation: Secondary | ICD-10-CM | POA: Diagnosis not present

## 2021-10-19 DIAGNOSIS — B078 Other viral warts: Secondary | ICD-10-CM | POA: Diagnosis not present

## 2021-10-19 DIAGNOSIS — L718 Other rosacea: Secondary | ICD-10-CM | POA: Diagnosis not present

## 2021-10-19 DIAGNOSIS — D485 Neoplasm of uncertain behavior of skin: Secondary | ICD-10-CM | POA: Diagnosis not present

## 2021-10-19 DIAGNOSIS — L918 Other hypertrophic disorders of the skin: Secondary | ICD-10-CM | POA: Diagnosis not present

## 2021-10-19 DIAGNOSIS — L812 Freckles: Secondary | ICD-10-CM | POA: Diagnosis not present

## 2021-10-19 MED ORDER — METRONIDAZOLE 0.75 % EX CREA
TOPICAL_CREAM | Freq: Two times a day (BID) | CUTANEOUS | 11 refills | Status: DC
  Filled 2021-10-19: qty 45, 15d supply, fill #0

## 2021-10-23 ENCOUNTER — Ambulatory Visit: Payer: Self-pay | Admitting: Surgery

## 2021-10-23 DIAGNOSIS — Z01818 Encounter for other preprocedural examination: Secondary | ICD-10-CM

## 2021-10-24 ENCOUNTER — Other Ambulatory Visit (HOSPITAL_COMMUNITY): Payer: Self-pay

## 2021-11-15 ENCOUNTER — Other Ambulatory Visit (HOSPITAL_COMMUNITY): Payer: Self-pay

## 2021-11-15 NOTE — Patient Instructions (Signed)
DUE TO COVID-19 ONLY ONE VISITOR  (aged 54 and older)  IS ALLOWED TO COME WITH YOU AND STAY IN THE WAITING ROOM ONLY DURING PRE OP AND PROCEDURE.   ?**NO VISITORS ARE ALLOWED IN THE SHORT STAY AREA OR RECOVERY ROOM!!** ? ?IF YOU WILL BE ADMITTED INTO THE HOSPITAL YOU ARE ALLOWED ONLY TWO SUPPORT PEOPLE DURING VISITATION HOURS ONLY (7 AM -8PM)   ?The support person(s) must pass our screening, gel in and out, and wear a mask at all times, including in the patient?s room. ?Patients must also wear a mask when staff or their support person are in the room. ?Visitors GUEST BADGE MUST BE WORN VISIBLY  ?One adult visitor may remain with you overnight and MUST be in the room by 8 P.M. ?  ? ? Your procedure is scheduled on: 11/21/21 ? ? Report to [facility] Main Entrance ? ?  Report to short stay at 5:15 AM ? ? Call this number if you have problems the morning of surgery [phone] ? ? Do not eat food :After Midnight. ? ? After Midnight you may have the following liquids until _4:30_____ AM DAY OF SURGERY ? ?Water ?Black Coffee (sugar ok, NO MILK/CREAM OR CREAMERS)  ?Tea (sugar ok, NO MILK/CREAM OR CREAMERS) regular and decaf                             ?Plain Jell-O (NO RED)                                           ?Fruit ices (not with fruit pulp, NO RED)                                     ?Popsicles (NO RED)                                                                  ?Juice: apple, WHITE grape, WHITE cranberry ?Sports drinks like Gatorade (NO RED) ?Clear broth(vegetable,chicken,beef) ?   ? ?  ?  ?The day of surgery:  ?Drink ONE (1) Pre-Surgery Clear Ensure or G2 at 4:15 AM the morning of surgery. Drink in one sitting. Do not sip.  ?This drink was given to you during your hospital  ?pre-op appointment visit. ?Nothing else to drink after completing the  ? G2. At 4:30 am ?  ?       If you have questions, please contact your surgeon?s office. ? ?NO SOLID FOOD AFTER 6:00 PM THE NIGHT BEFORE YOUR SURGERY.   ? ?YOU MAY DRINK CLEAR FLUIDS THE MORNING OF SURGERY UNTIL:4:30 am ? ? ?THE G2 DRINK YOU DRINK BEFORE YOU LEAVE HOME WILL BE THE LAST FLUIDS YOU DRINK BEFORE SURGERY THEN NOTHING MORE BY MOUTH. ? ? ?PAIN IS EXPECTED AFTER SURGERY AND WILL NOT BE COMPLETELY ELIMINATED. ? ? AMBULATION AND TYLENOL WILL HELP REDUCE INCISIONAL AND GAS PAIN. MOVEMENT IS KEY! ? ?YOU ARE EXPECTED TO BE OUT OF BED WITHIN 4 HOURS OF ADMISSION TO YOUR PATIENT ROOM. ? ?SITTING  IN THE RECLINER THROUGHOUT THE DAY IS IMPORTANT FOR DRINKING FLUIDS AND MOVING GAS THROUGHOUT THE GI TRACT. ? ?COMPRESSION STOCKINGS SHOULD BE WORN [place] UNLESS YOU ARE WALKING.  ? ?INCENTIVE SPIROMETER SHOULD BE USED EVERY HOUR WHILE AWAKE TO DECREASE POST-OPERATIVE COMPLICATIONS SUCH AS PNEUMONIA. ? ?WHEN DISCHARGED HOME, IT IS IMPORTANT TO CONTINUE TO WALK EVERY HOUR AND USE THE INCENTIVE SPIROMETER EVERY HOUR.  ? ? ? ? ?    ? ?FOLLOW BOWEL PREP AND ANY ADDITIONAL PRE OP INSTRUCTIONS YOU RECEIVED FROM YOUR SURGEON'S OFFICE!!! ?  ?  ?Oral Hygiene is also important to reduce your risk of infection.                                    ?Remember - BRUSH YOUR TEETH THE MORNING OF SURGERY WITH YOUR REGULAR TOOTHPASTE ? ? Do NOT smoke after Midnight ? ? Take these medicines the morning of surgery with A SIP OF WATER: Requip ? ?DO NOT TAKE ANY ORAL DIABETIC MEDICATIONS DAY OF YOUR SURGERY ? ?Bring CPAP mask and tubing day of surgery. ?                  ?           You may not have any metal on your body including hair pins, jewelry, and body piercing ? ?           Do not wear make-up, lotions, powders, perfumes/cologne, or deodorant ? ?Do not wear nail polish including gel and S&S, artificial/acrylic nails, or any other type of covering on natural nails including finger and toenails. If you have artificial nails, gel coating, etc. that needs to be removed by a nail salon please have this removed prior to surgery or surgery may need to be canceled/  delayed if the surgeon/ anesthesia feels like they are unable to be safely monitored.  ? ?Do not shave  48 hours prior to surgery.  ? ? ? Do not bring valuables to the hospital. [facility] NOT ?            RESPONSIBLE   FOR VALUABLES. ? ? Contacts, dentures or bridgework may not be worn into surgery. ? ? Bring small overnight bag day of surgery. ?  ? ? Special Instructions: Bring a copy of your healthcare power of attorney and living will documents the day of surgery if you haven't scanned them before. ? ?            Please read over the following fact sheets you were given: IF [place] [phone] ? ?   [place] - Preparing for Surgery ?Before surgery, you can play an important role.  Because skin is not sterile, your skin needs to be as free of germs as possible.  You can reduce the number of germs on your skin by washing with CHG (chlorahexidine gluconate) soap before surgery.  CHG is an antiseptic cleaner which kills germs and bonds with the skin to continue killing germs even after washing. ?Please DO NOT use if you have an allergy to CHG or antibacterial soaps.  If your skin becomes reddened/irritated stop using the CHG and inform your nurse when you arrive at Short Stay. ?Do not shave (including legs and underarms) for at least 48 hours prior to the first CHG shower.   ?Please follow these instructions carefully: ?  1.  Shower with CHG Soap the night before surgery and the  morning of Surgery. ? 2.  If you choose to wash your hair, wash your hair first as usual with your  normal  shampoo. ? 3.  After you shampoo, rinse your hair and body thoroughly to remove the  shampoo.                           ? 4.  Use CHG as you would any other liquid soap.  You can apply chg directly  to the skin and wash  ?                     Gently with a scrungie or clean washcloth. ? 5.  Apply the CHG Soap to your body ONLY FROM THE NECK DOWN.   Do not use on face/ open       ?                     Wound or open sores. Avoid contact with eyes, ears mouth and genitals (private parts).  ?                     Production manager,  Genitals (private parts) with your normal soap. ?            6.  Wash thoroughly, paying special attention to the area where your surgery  will be performed. ? 7.  Thoroughly rinse your body with warm water from the neck down. ? 8.  DO NOT shower/wash with your normal soap after using and rinsing off  the CHG Soap. ?               9.  Pat yourself dry with a clean towel. ?           10.  Wear clean pajamas. ?           11.  Place clean sheets on your bed the night of your first shower and do not  sleep with pets. ?Day of Surgery : ?Do not apply any lotions/deodorants the morning of surgery.  Please wear clean clothes to the hospital/surgery center. ? ?FAILURE TO FOLLOW THESE INSTRUCTIONS MAY RESULT IN THE CANCELLATION OF YOUR SURGERY ? ? ? ?________________________________________________________________________  ? ?Incentive Spirometer ? ?An incentive spirometer is a tool that can help keep your lungs clear and active. This tool measures how well you are filling your lungs with each breath. Taking long deep breaths may help reverse or decrease the chance of developing breathing (pulmonary) problems (especially infection) following: ?A long period of time when you are unable to move or be active. ?BEFORE THE PROCEDURE  ?If the spirometer includes an indicator to show your best effort, your nurse or respiratory therapist will set it to a desired goal. ?If possible, sit up straight or lean slightly forward. Try not to slouch. ?Hold the incentive spirometer in an upright position. ?INSTRUCTIONS FOR USE  ?Sit on the edge of your bed if possible, or sit up as far as you can in bed or on a chair. ?Hold the incentive spirometer in an upright position. ?Breathe out normally. ?Place the mouthpiece in your mouth and seal your lips tightly around it. ?Breathe in slowly and as deeply  as possible, raising the piston or the ball toward the top of the column. ?Hold your breath for 3-5 seconds or for as long  as possible. Allow the piston or ball to fall to the bottom of the column. ?Remove th

## 2021-11-16 ENCOUNTER — Encounter (HOSPITAL_COMMUNITY): Payer: Self-pay

## 2021-11-16 ENCOUNTER — Other Ambulatory Visit: Payer: Self-pay

## 2021-11-16 ENCOUNTER — Encounter (HOSPITAL_COMMUNITY)
Admission: RE | Admit: 2021-11-16 | Discharge: 2021-11-16 | Disposition: A | Discharge status: Home / Self Care | Payer: 59 | Source: Ambulatory Visit | Attending: Surgery | Admitting: Surgery

## 2021-11-16 VITALS — BP 112/86 | HR 77 | Temp 99.3°F | Resp 16 | Ht 59.0 in | Wt 243.0 lb

## 2021-11-16 DIAGNOSIS — Z01812 Encounter for preprocedural laboratory examination: Secondary | ICD-10-CM | POA: Insufficient documentation

## 2021-11-16 DIAGNOSIS — Z01818 Encounter for other preprocedural examination: Secondary | ICD-10-CM

## 2021-11-16 HISTORY — DX: Other complications of anesthesia, initial encounter: T88.59XA

## 2021-11-16 LAB — CBC WITH DIFFERENTIAL/PLATELET
Abs Immature Granulocytes: 0.02 10*3/uL (ref 0.00–0.07)
Basophils Absolute: 0.1 10*3/uL (ref 0.0–0.1)
Basophils Relative: 1 %
Eosinophils Absolute: 0.1 10*3/uL (ref 0.0–0.5)
Eosinophils Relative: 1 %
HCT: 41.7 % (ref 36.0–46.0)
Hemoglobin: 13.4 g/dL (ref 12.0–15.0)
Immature Granulocytes: 0 %
Lymphocytes Relative: 31 %
Lymphs Abs: 2.5 10*3/uL (ref 0.7–4.0)
MCH: 27.6 pg (ref 26.0–34.0)
MCHC: 32.1 g/dL (ref 30.0–36.0)
MCV: 86 fL (ref 80.0–100.0)
Monocytes Absolute: 0.5 10*3/uL (ref 0.1–1.0)
Monocytes Relative: 6 %
Neutro Abs: 4.9 10*3/uL (ref 1.7–7.7)
Neutrophils Relative %: 61 %
Platelets: 349 10*3/uL (ref 150–400)
RBC: 4.85 MIL/uL (ref 3.87–5.11)
RDW: 14.6 % (ref 11.5–15.5)
WBC: 8.1 10*3/uL (ref 4.0–10.5)
nRBC: 0 % (ref 0.0–0.2)

## 2021-11-16 LAB — COMPREHENSIVE METABOLIC PANEL
ALT: 59 U/L — ABNORMAL HIGH (ref 0–44)
AST: 32 U/L (ref 15–41)
Albumin: 4.4 g/dL (ref 3.5–5.0)
Alkaline Phosphatase: 50 U/L (ref 38–126)
Anion gap: 8 (ref 5–15)
BUN: 23 mg/dL — ABNORMAL HIGH (ref 6–20)
CO2: 27 mmol/L (ref 22–32)
Calcium: 9.8 mg/dL (ref 8.9–10.3)
Chloride: 101 mmol/L (ref 98–111)
Creatinine, Ser: 0.82 mg/dL (ref 0.44–1.00)
GFR, Estimated: >60 mL/min (ref >60)
Glucose, Bld: 84 mg/dL (ref 70–99)
Potassium: 4.5 mmol/L (ref 3.5–5.1)
Sodium: 136 mmol/L (ref 135–145)
Total Bilirubin: 0.4 mg/dL (ref 0.3–1.2)
Total Protein: 8 g/dL (ref 6.5–8.1)

## 2021-11-16 LAB — HEMOGLOBIN A1C
Hgb A1c MFr Bld: 6.1 % — ABNORMAL HIGH (ref 4.8–5.6)
Mean Plasma Glucose: 128.37 mg/dL

## 2021-11-16 LAB — TYPE AND SCREEN
ABO/RH(D): O POS
Antibody Screen: NEGATIVE

## 2021-11-16 LAB — GLUCOSE, CAPILLARY: Glucose-Capillary: 94 mg/dL (ref 70–99)

## 2021-11-16 NOTE — Progress Notes (Signed)
?  Bowel prep reminder:NA ? ?PCP - Dr. Lenice Llamas ?Cardiologist - none ? ?Chest x-ray - 1011/22-epic ?EKG - 06/05/21-epic ?Stress Test - no ?ECHO - 2019-epic ?Cardiac Cath - no ?Pacemaker/ICD device last checked:NA ? ?Sleep Study - yes ?CPAP - yes ? ?Fasting Blood Sugar - 98-120 ?Checks Blood Sugar _____ times a day.every 3 months ? ?Blood Thinner Instructions:NA ?Aspirin Instructions: ?Last Dose: ? ?Anesthesia review: no ? ?Patient denies shortness of breath, fever, cough and chest pain at PAT appointment ?Pt has a BMI of 49.0 and she does get SOB climbing stairs and walking ? ?Patient verbalized understanding of instructions that were given to them at the PAT appointment. Patient was also instructed that they will need to review over the PAT instructions again at home before surgery. yes ?

## 2021-11-20 NOTE — Anesthesia Preprocedure Evaluation (Addendum)
Anesthesia Evaluation  ?Patient identified by MRN, date of birth, ID band ?Patient awake ? ? ? ?Reviewed: ?Allergy & Precautions, NPO status , Patient's Chart, lab work & pertinent test results ? ?Airway ?Mallampati: II ? ?TM Distance: >3 FB ?Neck ROM: Full ? ? ? Dental ?no notable dental hx. ? ?  ?Pulmonary ?sleep apnea , former smoker,  ?  ?Pulmonary exam normal ?breath sounds clear to auscultation ? ? ? ? ? ? Cardiovascular ?negative cardio ROS ?Normal cardiovascular exam ?Rhythm:Regular Rate:Normal ? ? ?  ?Neuro/Psych ?negative neurological ROS ? negative psych ROS  ? GI/Hepatic ?negative GI ROS, Neg liver ROS,   ?Endo/Other  ?diabetesMorbid obesity ? Renal/GU ?negative Renal ROS  ?negative genitourinary ?  ?Musculoskeletal ?negative musculoskeletal ROS ?(+)  ? Abdominal ?  ?Peds ?negative pediatric ROS ?(+)  Hematology ?negative hematology ROS ?(+)   ?Anesthesia Other Findings ? ? Reproductive/Obstetrics ?negative OB ROS ? ?  ? ? ? ? ? ? ? ? ? ? ? ? ? ?  ?  ? ? ? ? ? ? ? ?Anesthesia Physical ?Anesthesia Plan ? ?ASA: 3 ? ?Anesthesia Plan: General  ? ?Post-op Pain Management: Dilaudid IV  ? ?Induction: Intravenous ? ?PONV Risk Score and Plan: 4 or greater and Ondansetron, Dexamethasone, Droperidol, Scopolamine patch - Pre-op, Treatment may vary due to age or medical condition and Midazolam ? ?Airway Management Planned: Oral ETT ? ?Additional Equipment:  ? ?Intra-op Plan:  ? ?Post-operative Plan: Extubation in OR ? ?Informed Consent: I have reviewed the patients History and Physical, chart, labs and discussed the procedure including the risks, benefits and alternatives for the proposed anesthesia with the patient or authorized representative who has indicated his/her understanding and acceptance.  ? ? ? ?Dental advisory given ? ?Plan Discussed with: CRNA and Surgeon ? ?Anesthesia Plan Comments:   ? ? ? ? ? ? ?Anesthesia Quick Evaluation ? ?

## 2021-11-21 ENCOUNTER — Encounter (HOSPITAL_COMMUNITY)
Admission: RE | Disposition: A | Discharge status: Home / Self Care | Payer: Self-pay | Source: Home / Self Care | Attending: Surgery

## 2021-11-21 ENCOUNTER — Other Ambulatory Visit: Payer: Self-pay

## 2021-11-21 ENCOUNTER — Inpatient Hospital Stay (HOSPITAL_COMMUNITY): Payer: 59 | Admitting: Anesthesiology

## 2021-11-21 ENCOUNTER — Encounter (HOSPITAL_COMMUNITY): Payer: Self-pay | Admitting: Surgery

## 2021-11-21 ENCOUNTER — Inpatient Hospital Stay (HOSPITAL_COMMUNITY)
Admission: RE | Admit: 2021-11-21 | Discharge: 2021-11-23 | DRG: 620 | Disposition: A | Discharge status: Home / Self Care | Payer: 59 | Attending: Surgery | Admitting: Surgery

## 2021-11-21 DIAGNOSIS — K9171 Accidental puncture and laceration of a digestive system organ or structure during a digestive system procedure: Secondary | ICD-10-CM | POA: Diagnosis not present

## 2021-11-21 DIAGNOSIS — Y658 Other specified misadventures during surgical and medical care: Secondary | ICD-10-CM | POA: Diagnosis not present

## 2021-11-21 DIAGNOSIS — G2581 Restless legs syndrome: Secondary | ICD-10-CM | POA: Diagnosis not present

## 2021-11-21 DIAGNOSIS — Z6841 Body Mass Index (BMI) 40.0 and over, adult: Secondary | ICD-10-CM

## 2021-11-21 DIAGNOSIS — E119 Type 2 diabetes mellitus without complications: Secondary | ICD-10-CM | POA: Diagnosis present

## 2021-11-21 DIAGNOSIS — Z823 Family history of stroke: Secondary | ICD-10-CM

## 2021-11-21 DIAGNOSIS — E559 Vitamin D deficiency, unspecified: Secondary | ICD-10-CM | POA: Diagnosis not present

## 2021-11-21 DIAGNOSIS — Z7984 Long term (current) use of oral hypoglycemic drugs: Secondary | ICD-10-CM

## 2021-11-21 DIAGNOSIS — E785 Hyperlipidemia, unspecified: Secondary | ICD-10-CM | POA: Diagnosis not present

## 2021-11-21 DIAGNOSIS — Z801 Family history of malignant neoplasm of trachea, bronchus and lung: Secondary | ICD-10-CM

## 2021-11-21 DIAGNOSIS — Z8349 Family history of other endocrine, nutritional and metabolic diseases: Secondary | ICD-10-CM

## 2021-11-21 DIAGNOSIS — K209 Esophagitis, unspecified without bleeding: Secondary | ICD-10-CM | POA: Diagnosis present

## 2021-11-21 DIAGNOSIS — K449 Diaphragmatic hernia without obstruction or gangrene: Secondary | ICD-10-CM | POA: Diagnosis present

## 2021-11-21 DIAGNOSIS — Z800 Family history of malignant neoplasm of digestive organs: Secondary | ICD-10-CM

## 2021-11-21 DIAGNOSIS — L930 Discoid lupus erythematosus: Secondary | ICD-10-CM | POA: Diagnosis not present

## 2021-11-21 DIAGNOSIS — Y733 Surgical instruments, materials and gastroenterology and urology devices (including sutures) associated with adverse incidents: Secondary | ICD-10-CM | POA: Diagnosis not present

## 2021-11-21 DIAGNOSIS — Z833 Family history of diabetes mellitus: Secondary | ICD-10-CM | POA: Diagnosis not present

## 2021-11-21 DIAGNOSIS — Z87891 Personal history of nicotine dependence: Secondary | ICD-10-CM | POA: Diagnosis not present

## 2021-11-21 DIAGNOSIS — Z01818 Encounter for other preprocedural examination: Secondary | ICD-10-CM

## 2021-11-21 DIAGNOSIS — G4733 Obstructive sleep apnea (adult) (pediatric): Secondary | ICD-10-CM | POA: Diagnosis not present

## 2021-11-21 DIAGNOSIS — Z808 Family history of malignant neoplasm of other organs or systems: Secondary | ICD-10-CM

## 2021-11-21 DIAGNOSIS — Z79899 Other long term (current) drug therapy: Secondary | ICD-10-CM | POA: Diagnosis not present

## 2021-11-21 DIAGNOSIS — Z8042 Family history of malignant neoplasm of prostate: Secondary | ICD-10-CM | POA: Diagnosis not present

## 2021-11-21 DIAGNOSIS — Z8249 Family history of ischemic heart disease and other diseases of the circulatory system: Secondary | ICD-10-CM | POA: Diagnosis not present

## 2021-11-21 DIAGNOSIS — Z794 Long term (current) use of insulin: Secondary | ICD-10-CM | POA: Diagnosis not present

## 2021-11-21 DIAGNOSIS — Z882 Allergy status to sulfonamides status: Secondary | ICD-10-CM

## 2021-11-21 HISTORY — PX: XI ROBOTIC ASSISTED HIATAL HERNIA REPAIR: SHX6889

## 2021-11-21 LAB — CBC
HCT: 39 % (ref 36.0–46.0)
Hemoglobin: 12.4 g/dL (ref 12.0–15.0)
MCH: 27.4 pg (ref 26.0–34.0)
MCHC: 31.8 g/dL (ref 30.0–36.0)
MCV: 86.3 fL (ref 80.0–100.0)
Platelets: 290 10*3/uL (ref 150–400)
RBC: 4.52 MIL/uL (ref 3.87–5.11)
RDW: 14.6 % (ref 11.5–15.5)
WBC: 13.3 10*3/uL — ABNORMAL HIGH (ref 4.0–10.5)
nRBC: 0 % (ref 0.0–0.2)

## 2021-11-21 LAB — GLUCOSE, CAPILLARY
Glucose-Capillary: 123 mg/dL — ABNORMAL HIGH (ref 70–99)
Glucose-Capillary: 192 mg/dL — ABNORMAL HIGH (ref 70–99)
Glucose-Capillary: 150 mg/dL — ABNORMAL HIGH (ref 70–99)
Glucose-Capillary: 111 mg/dL — ABNORMAL HIGH (ref 70–99)

## 2021-11-21 LAB — CREATININE, SERUM
Creatinine, Ser: 0.9 mg/dL (ref 0.44–1.00)
GFR, Estimated: >60 mL/min (ref >60)

## 2021-11-21 LAB — ABO/RH: ABO/RH(D): O POS

## 2021-11-21 SURGERY — CREATION, GASTRIC BYPASS, ROUX-EN-Y, ROBOT-ASSISTED
Anesthesia: General

## 2021-11-21 MED ORDER — ROCURONIUM BROMIDE 10 MG/ML (PF) SYRINGE
PREFILLED_SYRINGE | INTRAVENOUS | Status: AC
  Filled 2021-11-21: qty 10

## 2021-11-21 MED ORDER — INSULIN ASPART 100 UNIT/ML IJ SOLN: 0.0000 [IU] | Freq: Every day | INTRAMUSCULAR | Status: DC

## 2021-11-21 MED ORDER — ONDANSETRON HCL 4 MG/2ML IJ SOLN
INTRAMUSCULAR | Status: AC
  Filled 2021-11-21: qty 2

## 2021-11-21 MED ORDER — OXYCODONE HCL 5 MG/5ML PO SOLN: 5.0000 mg | Freq: Once | ORAL | Status: DC | PRN

## 2021-11-21 MED ORDER — LIDOCAINE HCL 2 % IJ SOLN
INTRAMUSCULAR | Status: AC
  Filled 2021-11-21: qty 20

## 2021-11-21 MED ORDER — STERILE WATER FOR IRRIGATION IR SOLN
Status: DC | PRN
  Administered 2021-11-21: 1000 mL

## 2021-11-21 MED ORDER — ROCURONIUM BROMIDE 10 MG/ML (PF) SYRINGE
PREFILLED_SYRINGE | INTRAVENOUS | Status: DC | PRN
  Administered 2021-11-21 (×3): 20 mg via INTRAVENOUS
  Administered 2021-11-21: 10 mg via INTRAVENOUS
  Administered 2021-11-21: 20 mg via INTRAVENOUS
  Administered 2021-11-21: 70 mg via INTRAVENOUS

## 2021-11-21 MED ORDER — CELECOXIB 200 MG PO CAPS
ORAL_CAPSULE | ORAL | Status: AC
  Filled 2021-11-21: qty 1

## 2021-11-21 MED ORDER — APREPITANT 40 MG PO CAPS
40.0000 mg | ORAL_CAPSULE | ORAL | Status: AC
  Administered 2021-11-21: 40 mg via ORAL
  Filled 2021-11-21: qty 1

## 2021-11-21 MED ORDER — CHLORHEXIDINE GLUCONATE CLOTH 2 % EX PADS: 6.0000 | MEDICATED_PAD | Freq: Once | CUTANEOUS | Status: DC

## 2021-11-21 MED ORDER — ATORVASTATIN CALCIUM 20 MG PO TABS
20.0000 mg | ORAL_TABLET | Freq: Every day | ORAL | Status: DC
  Administered 2021-11-22: 20 mg via ORAL
  Filled 2021-11-21: qty 1

## 2021-11-21 MED ORDER — LIDOCAINE 20MG/ML (2%) 15 ML SYRINGE OPTIME
INTRAMUSCULAR | Status: DC | PRN
Start: 2021-11-21 — End: 2021-11-21
  Administered 2021-11-21: 1.5 mg/kg/h via INTRAVENOUS

## 2021-11-21 MED ORDER — BUPIVACAINE-EPINEPHRINE (PF) 0.25% -1:200000 IJ SOLN
INTRAMUSCULAR | Status: AC
  Filled 2021-11-21: qty 30

## 2021-11-21 MED ORDER — LIDOCAINE 2% (20 MG/ML) 5 ML SYRINGE
INTRAMUSCULAR | Status: DC | PRN
  Administered 2021-11-21: 80 mg via INTRAVENOUS

## 2021-11-21 MED ORDER — SCOPOLAMINE 1 MG/3DAYS TD PT72
MEDICATED_PATCH | TRANSDERMAL | Status: AC
  Filled 2021-11-21: qty 1

## 2021-11-21 MED ORDER — OXYCODONE HCL 5 MG PO TABS: 5.0000 mg | ORAL_TABLET | Freq: Once | ORAL | Status: DC | PRN

## 2021-11-21 MED ORDER — FENTANYL CITRATE (PF) 250 MCG/5ML IJ SOLN
INTRAMUSCULAR | Status: AC
  Filled 2021-11-21: qty 5

## 2021-11-21 MED ORDER — ORAL CARE MOUTH RINSE: 15.0000 mL | Freq: Once | OROMUCOSAL | Status: AC

## 2021-11-21 MED ORDER — DEXAMETHASONE SODIUM PHOSPHATE 10 MG/ML IJ SOLN
INTRAMUSCULAR | Status: DC | PRN
  Administered 2021-11-21: 4 mg via INTRAVENOUS

## 2021-11-21 MED ORDER — DROPERIDOL 2.5 MG/ML IJ SOLN
INTRAMUSCULAR | Status: DC | PRN
  Administered 2021-11-21: .625 mg via INTRAVENOUS

## 2021-11-21 MED ORDER — MIDAZOLAM HCL 2 MG/2ML IJ SOLN
INTRAMUSCULAR | Status: DC | PRN
  Administered 2021-11-21: 2 mg via INTRAVENOUS

## 2021-11-21 MED ORDER — EPHEDRINE SULFATE-NACL 50-0.9 MG/10ML-% IV SOSY
PREFILLED_SYRINGE | INTRAVENOUS | Status: DC | PRN
  Administered 2021-11-21: 5 mg via INTRAVENOUS

## 2021-11-21 MED ORDER — BUPIVACAINE-EPINEPHRINE 0.25% -1:200000 IJ SOLN
INTRAMUSCULAR | Status: DC | PRN
  Administered 2021-11-21: 30 mL

## 2021-11-21 MED ORDER — ENOXAPARIN SODIUM 40 MG/0.4ML IJ SOSY
40.0000 mg | PREFILLED_SYRINGE | INTRAMUSCULAR | Status: AC
  Administered 2021-11-21: 40 mg via SUBCUTANEOUS
  Filled 2021-11-21: qty 0.4

## 2021-11-21 MED ORDER — LACTATED RINGERS IV SOLN: INTRAVENOUS | Status: DC

## 2021-11-21 MED ORDER — ONDANSETRON HCL 4 MG/2ML IJ SOLN: 4.0000 mg | INTRAMUSCULAR | Status: DC | PRN

## 2021-11-21 MED ORDER — BUPIVACAINE LIPOSOME 1.3 % IJ SUSP
INTRAMUSCULAR | Status: AC
  Filled 2021-11-21: qty 20

## 2021-11-21 MED ORDER — KETAMINE HCL 10 MG/ML IJ SOLN
INTRAMUSCULAR | Status: DC | PRN
  Administered 2021-11-21: 25 mg via INTRAVENOUS

## 2021-11-21 MED ORDER — LIDOCAINE HCL (PF) 2 % IJ SOLN
INTRAMUSCULAR | Status: AC
Start: 2021-11-21 — End: ?
  Filled 2021-11-21: qty 5

## 2021-11-21 MED ORDER — KETAMINE HCL 10 MG/ML IJ SOLN
INTRAMUSCULAR | Status: AC
  Filled 2021-11-21: qty 1

## 2021-11-21 MED ORDER — ACETAMINOPHEN 160 MG/5ML PO SOLN: 1000.0000 mg | Freq: Three times a day (TID) | ORAL | Status: DC

## 2021-11-21 MED ORDER — DEXAMETHASONE SODIUM PHOSPHATE 10 MG/ML IJ SOLN
INTRAMUSCULAR | Status: AC
Start: 2021-11-21 — End: ?
  Filled 2021-11-21: qty 1

## 2021-11-21 MED ORDER — HYDROMORPHONE HCL 1 MG/ML IJ SOLN
INTRAMUSCULAR | Status: AC
  Administered 2021-11-21: 0.5 mg via INTRAVENOUS
  Filled 2021-11-21: qty 2

## 2021-11-21 MED ORDER — BUPIVACAINE LIPOSOME 1.3 % IJ SUSP: 20.0000 mL | Freq: Once | INTRAMUSCULAR | Status: DC

## 2021-11-21 MED ORDER — INSULIN ASPART 100 UNIT/ML IJ SOLN: 0.0000 [IU] | Freq: Three times a day (TID) | INTRAMUSCULAR | Status: DC

## 2021-11-21 MED ORDER — LACTATED RINGERS IV SOLN
INTRAVENOUS | Status: DC | PRN
  Administered 2021-11-21: 1000 mL

## 2021-11-21 MED ORDER — PANTOPRAZOLE SODIUM 40 MG IV SOLR
40.0000 mg | Freq: Every day | INTRAVENOUS | Status: DC
  Administered 2021-11-21 – 2021-11-22 (×2): 40 mg via INTRAVENOUS
  Filled 2021-11-21 (×2): qty 10

## 2021-11-21 MED ORDER — LORATADINE 10 MG PO TABS
10.0000 mg | ORAL_TABLET | Freq: Every day | ORAL | Status: DC
Start: 2021-11-21 — End: 2021-11-23
  Administered 2021-11-22: 10 mg via ORAL
  Filled 2021-11-21: qty 1

## 2021-11-21 MED ORDER — ACETAMINOPHEN 500 MG PO TABS
ORAL_TABLET | ORAL | Status: AC
  Filled 2021-11-21: qty 2

## 2021-11-21 MED ORDER — ACETAMINOPHEN 500 MG PO TABS
1000.0000 mg | ORAL_TABLET | Freq: Three times a day (TID) | ORAL | Status: DC
  Administered 2021-11-21 – 2021-11-23 (×6): 1000 mg via ORAL
  Filled 2021-11-21 (×6): qty 2

## 2021-11-21 MED ORDER — SUGAMMADEX SODIUM 500 MG/5ML IV SOLN
INTRAVENOUS | Status: DC | PRN
  Administered 2021-11-21: 400 mg via INTRAVENOUS

## 2021-11-21 MED ORDER — EPHEDRINE 5 MG/ML INJ
INTRAVENOUS | Status: AC
  Filled 2021-11-21: qty 10

## 2021-11-21 MED ORDER — ROPINIROLE HCL 1 MG PO TABS
1.0000 mg | ORAL_TABLET | Freq: Every day | ORAL | Status: DC
  Administered 2021-11-21 – 2021-11-22 (×2): 1 mg via ORAL
  Filled 2021-11-21 (×2): qty 1

## 2021-11-21 MED ORDER — METRONIDAZOLE 0.75 % EX CREA: TOPICAL_CREAM | Freq: Every day | CUTANEOUS | Status: DC

## 2021-11-21 MED ORDER — ONDANSETRON HCL 4 MG/2ML IJ SOLN: 4.0000 mg | Freq: Once | INTRAMUSCULAR | Status: DC | PRN

## 2021-11-21 MED ORDER — CHLORHEXIDINE GLUCONATE 0.12 % MT SOLN
15.0000 mL | Freq: Once | OROMUCOSAL | Status: AC
  Administered 2021-11-21: 15 mL via OROMUCOSAL

## 2021-11-21 MED ORDER — FENTANYL CITRATE (PF) 100 MCG/2ML IJ SOLN
INTRAMUSCULAR | Status: AC
  Filled 2021-11-21: qty 2

## 2021-11-21 MED ORDER — CEFOTETAN DISODIUM 2 G IJ SOLR
2.0000 g | INTRAMUSCULAR | Status: AC
  Administered 2021-11-21: 2 g via INTRAVENOUS
  Filled 2021-11-21: qty 2

## 2021-11-21 MED ORDER — DROPERIDOL 2.5 MG/ML IJ SOLN
INTRAMUSCULAR | Status: AC
  Filled 2021-11-21: qty 2

## 2021-11-21 MED ORDER — ACETAMINOPHEN 500 MG PO TABS
1000.0000 mg | ORAL_TABLET | ORAL | Status: AC
  Administered 2021-11-21: 1000 mg via ORAL
  Filled 2021-11-21: qty 2

## 2021-11-21 MED ORDER — MIDAZOLAM HCL 2 MG/2ML IJ SOLN
INTRAMUSCULAR | Status: AC
  Filled 2021-11-21: qty 2

## 2021-11-21 MED ORDER — CELECOXIB 100 MG PO CAPS
ORAL_CAPSULE | ORAL | Status: DC | PRN
  Administered 2021-11-21: 200 mg via ORAL

## 2021-11-21 MED ORDER — SUGAMMADEX SODIUM 500 MG/5ML IV SOLN
INTRAVENOUS | Status: AC
  Filled 2021-11-21: qty 10

## 2021-11-21 MED ORDER — OXYCODONE HCL 5 MG/5ML PO SOLN
5.0000 mg | Freq: Four times a day (QID) | ORAL | Status: DC | PRN
  Administered 2021-11-21 – 2021-11-22 (×4): 5 mg via ORAL
  Filled 2021-11-21 (×5): qty 5

## 2021-11-21 MED ORDER — HYDROMORPHONE HCL 1 MG/ML IJ SOLN
0.2500 mg | INTRAMUSCULAR | Status: DC | PRN
  Administered 2021-11-21: 0.5 mg via INTRAVENOUS
  Administered 2021-11-21 (×2): 0.25 mg via INTRAVENOUS
  Administered 2021-11-21: 0.5 mg via INTRAVENOUS

## 2021-11-21 MED ORDER — PROPOFOL 10 MG/ML IV BOLUS
INTRAVENOUS | Status: DC | PRN
  Administered 2021-11-21: 150 mg via INTRAVENOUS

## 2021-11-21 MED ORDER — SCOPOLAMINE 1 MG/3DAYS TD PT72
MEDICATED_PATCH | TRANSDERMAL | Status: DC | PRN
  Administered 2021-11-21: 1.5 mg via TRANSDERMAL

## 2021-11-21 MED ORDER — FENTANYL CITRATE (PF) 250 MCG/5ML IJ SOLN
INTRAMUSCULAR | Status: DC | PRN
  Administered 2021-11-21: 100 ug via INTRAVENOUS
  Administered 2021-11-21: 50 ug via INTRAVENOUS
  Administered 2021-11-21 (×2): 100 ug via INTRAVENOUS

## 2021-11-21 MED ORDER — ONDANSETRON HCL 4 MG/2ML IJ SOLN
INTRAMUSCULAR | Status: DC | PRN
  Administered 2021-11-21: 4 mg via INTRAVENOUS

## 2021-11-21 MED ORDER — 0.9 % SODIUM CHLORIDE (POUR BTL) OPTIME
TOPICAL | Status: DC | PRN
  Administered 2021-11-21: 1000 mL

## 2021-11-21 MED ORDER — SIMETHICONE 80 MG PO CHEW
80.0000 mg | CHEWABLE_TABLET | Freq: Four times a day (QID) | ORAL | Status: DC | PRN
  Administered 2021-11-21 – 2021-11-22 (×3): 80 mg via ORAL
  Filled 2021-11-21 (×3): qty 1

## 2021-11-21 MED ORDER — ENOXAPARIN SODIUM 30 MG/0.3ML IJ SOSY
30.0000 mg | PREFILLED_SYRINGE | Freq: Two times a day (BID) | INTRAMUSCULAR | Status: DC
  Administered 2021-11-21 – 2021-11-23 (×4): 30 mg via SUBCUTANEOUS
  Filled 2021-11-21 (×4): qty 0.3

## 2021-11-21 MED ORDER — ENSURE MAX PROTEIN PO LIQD
2.0000 [oz_av] | ORAL | Status: DC
  Administered 2021-11-22 (×6): 2 [oz_av] via ORAL

## 2021-11-21 MED ORDER — PROPOFOL 10 MG/ML IV BOLUS
INTRAVENOUS | Status: AC
  Filled 2021-11-21: qty 20

## 2021-11-21 MED ORDER — MORPHINE SULFATE (PF) 2 MG/ML IV SOLN: 1.0000 mg | INTRAVENOUS | Status: DC | PRN

## 2021-11-21 MED ORDER — BUPIVACAINE LIPOSOME 1.3 % IJ SUSP
INTRAMUSCULAR | Status: DC | PRN
  Administered 2021-11-21: 20 mL

## 2021-11-21 MED ORDER — FLUTICASONE PROPIONATE 50 MCG/ACT NA SUSP: 2.0000 | Freq: Every day | NASAL | Status: DC | PRN

## 2021-11-21 SURGICAL SUPPLY — 104 items
ADH SKN CLS APL DERMABOND .7 (GAUZE/BANDAGES/DRESSINGS) ×1
APL PRP STRL LF DISP 70% ISPRP (MISCELLANEOUS) ×1
APPLIER CLIP 5 13 M/L LIGAMAX5 (MISCELLANEOUS)
APPLIER CLIP ROT 10 11.4 M/L (STAPLE)
APR CLP MED LRG 11.4X10 (STAPLE)
APR CLP MED LRG 5 ANG JAW (MISCELLANEOUS)
BAG COUNTER SPONGE SURGICOUNT (BAG) ×3 IMPLANT
BAG SPNG CNTER NS LX DISP (BAG) ×1
BLADE SURG SZ11 CARB STEEL (BLADE) ×3 IMPLANT
CANNULA REDUC XI 12-8 STAPL (CANNULA) ×2
CANNULA REDUCER 12-8 DVNC XI (CANNULA) ×2 IMPLANT
CHLORAPREP W/TINT 26 (MISCELLANEOUS) ×3 IMPLANT
CLIP APPLIE 5 13 M/L LIGAMAX5 (MISCELLANEOUS) IMPLANT
CLIP APPLIE ROT 10 11.4 M/L (STAPLE) IMPLANT
COVER SURGICAL LIGHT HANDLE (MISCELLANEOUS) ×3 IMPLANT
COVER TIP SHEARS 8 DVNC (MISCELLANEOUS) ×2 IMPLANT
COVER TIP SHEARS 8MM DA VINCI (MISCELLANEOUS) ×2
DERMABOND ADVANCED (GAUZE/BANDAGES/DRESSINGS) ×1
DERMABOND ADVANCED .7 DNX12 (GAUZE/BANDAGES/DRESSINGS) ×2 IMPLANT
DRAIN CHANNEL 19F RND (DRAIN) ×1 IMPLANT
DRAIN PENROSE 0.5X18 (DRAIN) ×3 IMPLANT
DRAPE ARM DVNC X/XI (DISPOSABLE) ×8 IMPLANT
DRAPE COLUMN DVNC XI (DISPOSABLE) ×2 IMPLANT
DRAPE DA VINCI XI ARM (DISPOSABLE) ×8
DRAPE DA VINCI XI COLUMN (DISPOSABLE) ×2
DRSG TEGADERM 4X4.75 (GAUZE/BANDAGES/DRESSINGS) ×1 IMPLANT
ELECT REM PT RETURN 15FT ADLT (MISCELLANEOUS) ×3 IMPLANT
ENDOLOOP SUT PDS II  0 18 (SUTURE)
ENDOLOOP SUT PDS II 0 18 (SUTURE) IMPLANT
GAUZE 4X4 16PLY ~~LOC~~+RFID DBL (SPONGE) ×3 IMPLANT
GAUZE SPONGE 2X2 8PLY STRL LF (GAUZE/BANDAGES/DRESSINGS) IMPLANT
GLOVE SURG ENC MOIS LTX SZ7.5 (GLOVE) ×9 IMPLANT
GLOVE SURG UNDER LTX SZ8 (GLOVE) ×9 IMPLANT
GOWN STRL REUS W/ TWL XL LVL3 (GOWN DISPOSABLE) ×6 IMPLANT
GOWN STRL REUS W/TWL XL LVL3 (GOWN DISPOSABLE) ×6
GRASPER SUT TROCAR 14GX15 (MISCELLANEOUS) ×3 IMPLANT
HEMOSTAT SNOW SURGICEL 2X4 (HEMOSTASIS) ×1 IMPLANT
IRRIG SUCT STRYKERFLOW 2 WTIP (MISCELLANEOUS) ×2
IRRIGATION SUCT STRKRFLW 2 WTP (MISCELLANEOUS) ×2 IMPLANT
KIT BASIN OR (CUSTOM PROCEDURE TRAY) ×3 IMPLANT
KIT GASTRIC LAVAGE 34FR ADT (SET/KITS/TRAYS/PACK) ×3 IMPLANT
KIT TURNOVER KIT A (KITS) IMPLANT
LUBRICANT JELLY K Y 4OZ (MISCELLANEOUS) IMPLANT
MARKER SKIN DUAL TIP RULER LAB (MISCELLANEOUS) ×3 IMPLANT
MAT PREVALON FULL STRYKER (MISCELLANEOUS) ×3 IMPLANT
NDL SPNL 18GX3.5 QUINCKE PK (NEEDLE) ×2 IMPLANT
NEEDLE HYPO 22GX1.5 SAFETY (NEEDLE) ×3 IMPLANT
NEEDLE SPNL 18GX3.5 QUINCKE PK (NEEDLE) ×2 IMPLANT
OBTURATOR OPTICAL STANDARD 8MM (TROCAR) ×2
OBTURATOR OPTICAL STND 8 DVNC (TROCAR) ×1
OBTURATOR OPTICALSTD 8 DVNC (TROCAR) ×2 IMPLANT
PACK CARDIOVASCULAR III (CUSTOM PROCEDURE TRAY) ×3 IMPLANT
PAD POSITIONING PINK XL (MISCELLANEOUS) ×3 IMPLANT
RELOAD STAPLE 60 2.5 WHT DVNC (STAPLE) ×6 IMPLANT
RELOAD STAPLE 60 3.5 BLU DVNC (STAPLE) IMPLANT
RELOAD STAPLER 2.5X60 WHT DVNC (STAPLE) ×9 IMPLANT
RELOAD STAPLER 3.5X60 BLU DVNC (STAPLE) IMPLANT
SCISSORS LAP 5X35 DISP (ENDOMECHANICALS) IMPLANT
SEAL CANN UNIV 5-8 DVNC XI (MISCELLANEOUS) ×8 IMPLANT
SEAL XI 5MM-8MM UNIVERSAL (MISCELLANEOUS) ×8
SEALER SYNCHRO 8 IS4000 DV (MISCELLANEOUS) ×2
SEALER SYNCHRO 8 IS4000 DVNC (MISCELLANEOUS) ×2 IMPLANT
SEALER VESSEL DA VINCI XI (MISCELLANEOUS) ×2
SEALER VESSEL EXT DVNC XI (MISCELLANEOUS) ×2 IMPLANT
SOL ANTI FOG 6CC (MISCELLANEOUS) ×2 IMPLANT
SOLUTION ANTI FOG 6CC (MISCELLANEOUS) ×1
SOLUTION ELECTROLUBE (MISCELLANEOUS) ×3 IMPLANT
SPIKE FLUID TRANSFER (MISCELLANEOUS) ×3 IMPLANT
SPONGE DRAIN TRACH 4X4 STRL 2S (GAUZE/BANDAGES/DRESSINGS) ×1 IMPLANT
SPONGE GAUZE 2X2 STER 10/PKG (GAUZE/BANDAGES/DRESSINGS) ×1
STAPLER 60 DA VINCI SURE FORM (STAPLE) ×2
STAPLER 60 SUREFORM DVNC (STAPLE) ×2 IMPLANT
STAPLER CANNULA SEAL DVNC XI (STAPLE) ×2 IMPLANT
STAPLER CANNULA SEAL XI (STAPLE) ×2
STAPLER RELOAD 2.5X60 WHITE (STAPLE) ×18
STAPLER RELOAD 2.5X60 WHT DVNC (STAPLE) ×9
STAPLER RELOAD 3.5X60 BLU DVNC (STAPLE)
STAPLER RELOAD 3.5X60 BLUE (STAPLE)
SUT ETHIBOND 0 (SUTURE) ×3 IMPLANT
SUT ETHIBOND 0 36 GRN (SUTURE) ×7 IMPLANT
SUT ETHILON 2 0 PS N (SUTURE) ×1 IMPLANT
SUT MNCRL AB 4-0 PS2 18 (SUTURE) ×4 IMPLANT
SUT SILK 0 SH 30 (SUTURE) IMPLANT
SUT SILK 2 0 SH (SUTURE) IMPLANT
SUT V-LOC BARB 180 2/0GR6 GS22 (SUTURE) ×4
SUT VIC AB 2-0 SH 27 (SUTURE)
SUT VIC AB 2-0 SH 27XBRD (SUTURE) IMPLANT
SUT VICRYL 0 TIES 12 18 (SUTURE) ×3 IMPLANT
SUT VLOC BARB 180 ABS3/0GR12 (SUTURE) ×4
SUTURE V-LC BRB 180 2/0GR6GS22 (SUTURE) ×4 IMPLANT
SUTURE VLOC BRB 180 ABS3/0GR12 (SUTURE) ×4 IMPLANT
SYR 20ML LL LF (SYRINGE) ×3 IMPLANT
TIP INNERVISION DETACH 40FR (MISCELLANEOUS) IMPLANT
TIP INNERVISION DETACH 50FR (MISCELLANEOUS) IMPLANT
TIP INNERVISION DETACH 56FR (MISCELLANEOUS) IMPLANT
TIPS INNERVISION DETACH 40FR (MISCELLANEOUS)
TOWEL OR 17X26 10 PK STRL BLUE (TOWEL DISPOSABLE) ×3 IMPLANT
TRAY FOLEY MTR SLVR 16FR STAT (SET/KITS/TRAYS/PACK) IMPLANT
TROCAR ADV FIXATION 12X100MM (TROCAR) ×3 IMPLANT
TROCAR ADV FIXATION 5X100MM (TROCAR) IMPLANT
TROCAR BALLN 12MMX100 BLUNT (TROCAR) ×3 IMPLANT
TROCAR Z-THREAD FIOS 5X100MM (TROCAR) ×3 IMPLANT
TUBE CALIBRATION LAPBAND (TUBING) IMPLANT
TUBING INSUFFLATION 10FT LAP (TUBING) ×3 IMPLANT

## 2021-11-21 NOTE — Anesthesia Postprocedure Evaluation (Signed)
Anesthesia Post Note ? ?Patient: Connie Hall ? ?Procedure(s) Performed: XI ROBOT ASSISTED ROUX-EN-Y WITH UPPER ENDO ?XI ROBOTIC ASSISTED HIATAL HERNIA REPAIR ? ?  ? ?Patient location during evaluation: PACU ?Anesthesia Type: General ?Level of consciousness: awake and alert ?Pain management: pain level controlled ?Vital Signs Assessment: post-procedure vital signs reviewed and stable ?Respiratory status: spontaneous breathing, nonlabored ventilation, respiratory function stable and patient connected to nasal cannula oxygen ?Cardiovascular status: blood pressure returned to baseline and stable ?Postop Assessment: no apparent nausea or vomiting ?Anesthetic complications: no ? ? ?No notable events documented. ? ?Last Vitals:  ?Vitals:  ? 11/21/21 1230 11/21/21 1245  ?BP: 125/69 120/62  ?Pulse: 90 84  ?Resp: 16 14  ?Temp:    ?SpO2: 93% 93%  ?  ?Last Pain:  ?Vitals:  ? 11/21/21 1245  ?TempSrc:   ?PainSc: 0-No pain  ? ? ?  ?  ?  ?  ?  ?  ? ?[name] ? ? ? ? ?

## 2021-11-21 NOTE — Transfer of Care (Signed)
Immediate Anesthesia Transfer of Care Note ? ?Patient: Kloee Ballew ? ?Procedure(s) Performed: XI ROBOT ASSISTED ROUX-EN-Y WITH UPPER ENDO ?XI ROBOTIC ASSISTED HIATAL HERNIA REPAIR ? ?Patient Location: PACU ? ?Anesthesia Type:General ? ?Level of Consciousness: awake, alert  and patient cooperative ? ?Airway & Oxygen Therapy: Patient Spontanous Breathing and Patient connected to face mask oxygen ? ?Post-op Assessment: Report given to RN and Post -op Vital signs reviewed and stable ? ?Post vital signs: Reviewed and stable ? ?Last Vitals:  ?Vitals Value Taken Time  ?BP 155/87 11/21/21 1046  ?Temp    ?Pulse 107 11/21/21 1048  ?Resp 20 11/21/21 1048  ?SpO2 96 % 11/21/21 1048  ?Vitals shown include unvalidated device data. ? ?Last Pain:  ?Vitals:  ? 11/21/21 0616  ?TempSrc:   ?PainSc: 0-No pain  ?   ? ?  ? ?Complications: No notable events documented. ?

## 2021-11-21 NOTE — H&P (Signed)
? ?Admitting Physician: Nickola Major Aanvi Voyles ? ?Service: Bariatric Surgery ? ?CC: Morbid obesity, hiatal hernia ? ?Subjective  ? ?HPI: ?Connie Hall is an 56 y.o. female who is here for robotic gastric bypass with hiatal hernia repair. ? ?Past Medical History:  ?Diagnosis Date  ? Back pain   ? Complication of anesthesia   ? low O2 sat  ? Deviated nasal septum   ? Discoid lupus   ? DM (diabetes mellitus) ([place])   ? Family history of pancreatic cancer   ? Family history of prostate cancer   ? History of discoid lupus erythematosus   ? Hyperlipidemia   ? Leg edema   ? Obesity   ? OSA (obstructive sleep apnea)   ? Restless legs syndrome (RLS)   ? Vitamin D deficiency   ? ? ?Past Surgical History:  ?Procedure Laterality Date  ? BIOPSY  07/24/2021  ? Procedure: BIOPSY;  Surgeon: Irving Copas., MD;  Location: [facility];  Service: Gastroenterology;;  ? COLONOSCOPY WITH PROPOFOL N/A 07/24/2021  ? Procedure: COLONOSCOPY WITH PROPOFOL;  Surgeon: Mansouraty, Telford Nab., MD;  Location: [facility];  Service: Gastroenterology;  Laterality: N/A;  ? ESOPHAGOGASTRODUODENOSCOPY (EGD) WITH PROPOFOL N/A 07/24/2021  ? Procedure: ESOPHAGOGASTRODUODENOSCOPY (EGD) WITH PROPOFOL;  Surgeon: Rush Landmark Telford Nab., MD;  Location: [facility];  Service: Gastroenterology;  Laterality: N/A;  ? POLYPECTOMY  07/24/2021  ? Procedure: POLYPECTOMY;  Surgeon: Mansouraty, Telford Nab., MD;  Location: [facility];  Service: Gastroenterology;;  ? [place]  2009  ? left  ? Sebaceous cyst removal, back  03/27/2008  ? ? ?Family History  ?Problem Relation Age of Onset  ? Skin cancer Mother   ? Hyperlipidemia Mother   ? Hypertension Father   ? Stroke Father   ? Heart disease Father   ? Diabetes Father   ? Pancreatic cancer Father 21  ? Prostate cancer Father   ?     dx in his 30s; prostatectomy  ? Sleep apnea Father   ? Obesity Father   ? Pancreatic cancer Sister 78  ?     neuroendocrine tumor  ? Colon polyps Sister   ? Colon  polyps Sister   ? Colon polyps Sister   ? Congestive Heart Failure Maternal Aunt   ? Dementia Maternal Uncle   ? Lung cancer Paternal Aunt   ? Heart attack Paternal Uncle   ? Pancreatic cancer Paternal Uncle 63  ? Testicular cancer Cousin   ?     paternal first cousin dx in his 73s  ? Colon cancer Neg Hx   ? Esophageal cancer Neg Hx   ? Inflammatory bowel disease Neg Hx   ? Liver disease Neg Hx   ? Stomach cancer Neg Hx   ? Rectal cancer Neg Hx   ? ? ?Social:  reports that she quit smoking about 23 years ago. Her smoking use included cigarettes. She has a 15.00 pack-year smoking history. She has never used smokeless tobacco. She reports current alcohol use. She reports that she does not use drugs. ? ?Allergies:  ?Allergies  ?Allergen Reactions  ? Sulfa Antibiotics Hives  ? Sulfonamide Derivatives Hives  ? ? ?Medications: ?Current Outpatient Medications  ?Medication Instructions  ? ALLEGRA-D ALLERGY & CONGESTION 60-120 MG 12 hr tablet TAKE 1 TABLET BY MOUTH TWICE A DAY  ? atorvastatin (LIPITOR) 20 MG tablet Take 1 tablet by mouth daily.  ? fluticasone (FLONASE) 50 MCG/ACT nasal spray USE 2 SPRAYS IN EACH NOSTRIL ONCE A DAY  ? glucose  blood test strip Use as instructed 2x a day - for One Touch Verio Reflect  ? metFORMIN (GLUCOPHAGE) 1000 MG tablet TAKE 1 TABLET BY MOUTH DAILY WITH SUPPER  ? metroNIDAZOLE (METROCREAM) 0.75 % cream Apply 1 application on the skin twice a day  ? omeprazole (PRILOSEC) 20 mg, Oral, Daily  ? OneTouch Delica Lancets 61Y MISC Use 2x a day - for One Touch Verio Reflect  ? Ozempic (1 MG/DOSE) 1 mg, Subcutaneous, Weekly  ? rOPINIRole (REQUIP) 0.5 MG tablet TAKE 1 TABLET BY MOUTH EVERY AFTERNOON AND 2 TABLETS AT BEDTIME  ? Vitamin D3 5,000 Units, Oral, Daily  ? ? ?ROS - all of the below systems have been reviewed with the patient and positives are indicated with bold text ?General: chills, fever or night sweats ?Eyes: blurry vision or double vision ?ENT: epistaxis or sore  throat ?Allergy/Immunology: itchy/watery eyes or nasal congestion ?Hematologic/Lymphatic: bleeding problems, blood clots or swollen lymph nodes ?Endocrine: temperature intolerance or unexpected weight changes ?Breast: new or changing breast lumps or nipple discharge ?Resp: cough, shortness of breath, or wheezing ?CV: chest pain or dyspnea on exertion ?GI: as per HPI ?GU: dysuria, trouble voiding, or hematuria ?MSK: joint pain or joint stiffness ?Neuro: TIA or stroke symptoms ?Derm: pruritus and skin lesion changes ?Psych: anxiety and depression ? ?Objective  ? ?PE ?Blood pressure 122/74, pulse 84, temperature 98.3 ?F (36.8 ?C), temperature source Oral, resp. rate 16, weight 110.2 kg, last menstrual period 02/08/2021, SpO2 93 %. ?Constitutional: NAD; conversant; no deformities ?Eyes: Moist conjunctiva; no lid lag; anicteric; PERRL ?Neck: Trachea midline; no thyromegaly ?Lungs: Normal respiratory effort; no tactile fremitus ?CV: RRR; no palpable thrills; no pitting edema ?GI: Abd Soft, nontender; no palpable hepatosplenomegaly ?MSK: Normal range of motion of extremities; no clubbing/cyanosis ?Psychiatric: Appropriate affect; alert and oriented x3 ?Lymphatic: No palpable cervical or axillary lymphadenopathy ? ?Results for orders placed or performed during the hospital encounter of 11/21/21 (from the past 24 hour(s))  ?Glucose, capillary     Status: Abnormal  ? Collection Time: 11/21/21  6:05 AM  ?Result Value Ref Range  ? Glucose-Capillary 123 (H) 70 - 99 mg/dL  ? ? ?Imaging Orders  ?No imaging studies ordered today  ? ? ? ?Assessment and Plan  ? ? ?Connie Hall is a 56 y.o. female who is seen for bariatric surgery consultation. The patient has morbid obesity with a BMI of 49, and the following conditions related to obesity: diabetes, obstructive sleep apnea, hyperlipidemia and hiatal hernia. ? ?We discussed the surgical options to treat obesity and its associated comorbidity. After discussing the available  procedures in the region, we discussed in great detail the surgeries I offer: robotic sleeve gastrectomy and robotic roux-en-y gastric bypass. We discussed the procedures themselves as well as their risks, benefits and alternatives. I entered the patient's basic information into the [facility] Metabolic Surgery Risk/Benefit Calculator to facilitate this discussion. I also entered the patient's information into the [facility] Individualized Metabolic Surgery Score calculator to help explain surgery's effect on Type 2 Diabetes.  ? ?After a full discussion and all questions answered, the patient is interested in pursuing a robotic gastric bypass with upper endoscopy and with hiatal hernia repair. ? ?She has completed the following preoperative pathway: ?- Bloodwork ?- Dietician consult - Completed 08/10/21 with Sandie Ano, RD ?- Chest x-ray - No active cardiopulmonary disease 06/05/21 ?- EKG - Normal 06/05/21 ?- Psychology evaluation - Completed 08/17/21 with Clarice Pole, PsyD ?- Upper endoscopy with biopsy. ?- No  gross lesions in esophagus proximally. LA Grade A esophagitis with no bleeding distally. ?- Salmon-colored mucosa suspicious for short-segment Barrett's esophagus noted distally biopsied. ?- Z-line irregular, 36 cm from the incisors. ?- 1 cm hiatal hernia. ?- Erythematous mucosa in the gastric body and antrum. No other gross lesions in the stomach. Biopsied. ?- No gross lesions in the duodenal bulb, in the first portion of the duodenum and in the second portion of the duodenum. ?Pathology: ?FINAL MICROSCOPIC DIAGNOSIS:  ?A. STOMACH, BIOPSY:  ?- Gastric mucosa, no significant abnormality.  No inflammation,  ?intestinal metaplasia, dysplasia, or malignancy.  Warthin-Starry stain  ?negative for Helicobacter pylori.  ?B. ESOPHAGUS, DISTAL, BIOPSY:  ?- Acutely inflamed squamocolumnar mucosa with focal ulceration.  No  ?intestinal metaplasia, dysplasia, or malignancy.  ?C. COLON, CECUM,  RECTOSIGMOID, RECTUM, POLYPECTOMY:  ?- Tubular adenoma, 2 fragments.  No high-grade dysplasia or malignancy.  ?- Hyperplastic polyp, multiple fragments.  No dysplasia or malignancy.  ? ?Today we reviewed the risks, benefits and alternatives of

## 2021-11-21 NOTE — Progress Notes (Signed)
PHARMACY CONSULT FOR:  Risk Assessment for Post-Discharge VTE Following Bariatric Surgery ? ?Post-Discharge VTE Risk Assessment: ?This patient's probability of 30-day post-discharge VTE is increased due to the factors marked: ? Sleeve gastrectomy  ? Liver disorder (transplant, cirrhosis, or nonalcoholic steatohepatitis)  ? Hx of VTE  ? Hemorrhage requiring transfusion  ? GI perforation, leak, or obstruction  ? ====================================================  ?  Female  ?  Age >/=60 years  ?  BMI >/=50 kg/m2  ?  CHF  ?  Dyspnea at Rest  ?  Paraplegia  ? x Non-gastric-band surgery  ?  Operation Time >/=3 hr  ?  Return to OR   ?  Length of Stay >/= 3 d  ? Hypercoagulable condition  ? Significant venous stasis  ? ? ?Predicted probability of 30-day post-discharge VTE: 0.16% ? ? ?Other patient-specific factors to consider: NA ? ? ?Recommendation for Discharge: ?No pharmacologic prophylaxis post-discharge ? ? ? ?Connie Hall is a 56 y.o. female who underwent on Roux-en-Y Gastric Bypass on 11/21/21. ?  ?Case start: 0744 ?Case end: 1038 ? ? ?Allergies  ?Allergen Reactions  ? Sulfa Antibiotics Hives  ? Sulfonamide Derivatives Hives  ? ? ?Patient Measurements: ?Weight: 110.2 kg (243 lb) ?Body mass index is 49.08 kg/m?. ? ?No results for input(s): WBC, HGB, HCT, PLT, APTT, CREATININE, LABCREA, CREATININE, CREAT24HRUR, MG, PHOS, ALBUMIN, PROT, ALBUMIN, AST, ALT, ALKPHOS, BILITOT, BILIDIR, IBILI in the last 72 hours. ?Estimated Creatinine Clearance: 87.7 mL/min (by C-G formula based on SCr of 0.82 mg/dL). ? ? ? ?Past Medical History:  ?Diagnosis Date  ? Back pain   ? Complication of anesthesia   ? low O2 sat  ? Deviated nasal septum   ? Discoid lupus   ? DM (diabetes mellitus) ([place])   ? Family history of pancreatic cancer   ? Family history of prostate cancer   ? History of discoid lupus erythematosus   ? Hyperlipidemia   ? Leg edema   ? Obesity   ? OSA (obstructive sleep apnea)   ? Restless legs syndrome (RLS)   ? Vitamin D  deficiency   ? ? ?Connie Hall S. [name], PharmD, BCPS ?Clinical Staff Pharmacist ?[email or web address] ? ?[name], [employer] ?11/21/2021,12:44 PM ? ?

## 2021-11-21 NOTE — Progress Notes (Signed)

## 2021-11-21 NOTE — Anesthesia Procedure Notes (Signed)
Procedure Name: Intubation ?Date/Time: 11/21/2021 7:27 AM ?Performed by: Lollie Sails, CRNA ?Pre-anesthesia Checklist: Patient identified, Emergency Drugs available, Suction available, Patient being monitored and Timeout performed ?Patient Re-evaluated:Patient Re-evaluated prior to induction ?Oxygen Delivery Method: Circle system utilized ?Preoxygenation: Pre-oxygenation with 100% oxygen ?Induction Type: IV induction ?Ventilation: Two handed mask ventilation required and Oral airway inserted - appropriate to patient size ?Laryngoscope Size: Sabra Heck and 3 ?Grade View: Grade I ?Tube type: Oral ?Tube size: 7.0 mm ?Number of attempts: 1 ?Airway Equipment and Method: Stylet ?Placement Confirmation: ETT inserted through vocal cords under direct vision, positive ETCO2 and breath sounds checked- equal and bilateral ?Secured at: 21 cm ?Tube secured with: Tape ?Dental Injury: Teeth and Oropharynx as per pre-operative assessment  ? ? ? ? ?

## 2021-11-21 NOTE — Op Note (Signed)
? ?Patient: Connie Hall (26-Dec-1965, [id number]) ? ?Date of Surgery: 11/21/2021  ? ?Preoperative Diagnosis: DIABETES MELLITUS, HYPERLIPIDEMIA, MORBID OBESITY, OBSTRUCTIVE SLEEP APNEA  ? ?Postoperative Diagnosis: DIABETES MELLITUS, HYPERLIPIDEMIA, MORBID OBESITY; OBSTRUCTIVE SLEEP APNEA  ? ?Surgical Procedure:  ?XI ROBOT ASSISTED ROUX-EN-Y WITH UPPER ENDO:  ?XI ROBOTIC ASSISTED HIATAL HERNIA REPAIR: 26834 (CPT?)  ? ?Operative Team Members:  ?Surgeon(s) and Role: ?   * Elizeo Rodriques, Nickola Major, MD - Primary ?   Clovis Riley, MD  ? ?Anesthesiologist: Myrtie Soman, MD ?CRNA: Lind Covert, CRNA; Lollie Sails, CRNA  ? ?Anesthesia: General  ? ?Fluids:  ?Total I/O ?In: 100 [IV Piggyback:100] ?Out: 15 [Blood:15] ? ?Complications: Liver injury during instrument exchange ? ?Drains:  (19 Fr) Jackson-Pratt drain(s) with closed bulb suction in the left upper quadrant   ? ?Specimen: None ? ?Disposition:  PACU - hemodynamically stable. ? ?Plan of Care: Admit to inpatient  ? ? ? ?Indications for Procedure:  ?Connie Hall is a 56 y.o. female who is seen for bariatric surgery consultation. The patient has morbid obesity with a BMI of 49, and the following conditions related to obesity: diabetes, obstructive sleep apnea, hyperlipidemia and hiatal hernia. ? ?We discussed the surgical options to treat obesity and its associated comorbidity. After discussing the available procedures in the region, we discussed in great detail the surgeries I offer: robotic sleeve gastrectomy and robotic roux-en-y gastric bypass. We discussed the procedures themselves as well as their risks, benefits and alternatives. I entered the patient's basic information into the [facility] Metabolic Surgery Risk/Benefit Calculator to facilitate this discussion. I also entered the patient's information into the [facility] Individualized Metabolic Surgery Score calculator to help explain surgery's effect on Type 2 Diabetes.  ? ?After a full  discussion and all questions answered, the patient is interested in pursuing a robotic gastric bypass with upper endoscopy and with hiatal hernia repair. ? ?She has completed the following preoperative pathway: ?- Bloodwork ?- Dietician consult - Completed 08/10/21 with Sandie Ano, RD ?- Chest x-ray - No active cardiopulmonary disease 06/05/21 ?- EKG - Normal 06/05/21 ?- Psychology evaluation - Completed 08/17/21 with Clarice Pole, PsyD ?- Upper endoscopy with biopsy. ?- No gross lesions in esophagus proximally. LA Grade A esophagitis with no bleeding distally. ?- Salmon-colored mucosa suspicious for short-segment Barrett's esophagus noted distally biopsied. ?- Z-line irregular, 36 cm from the incisors. ?- 1 cm hiatal hernia. ?- Erythematous mucosa in the gastric body and antrum. No other gross lesions in the stomach. Biopsied. ?- No gross lesions in the duodenal bulb, in the first portion of the duodenum and in the second portion of the duodenum. ?Pathology: ?FINAL MICROSCOPIC DIAGNOSIS:  ?A. STOMACH, BIOPSY:  ?- Gastric mucosa, no significant abnormality.  No inflammation,  ?intestinal metaplasia, dysplasia, or malignancy.  Warthin-Starry stain  ?negative for Helicobacter pylori.  ?B. ESOPHAGUS, DISTAL, BIOPSY:  ?- Acutely inflamed squamocolumnar mucosa with focal ulceration.  No  ?intestinal metaplasia, dysplasia, or malignancy.  ?C. COLON, CECUM, RECTOSIGMOID, RECTUM, POLYPECTOMY:  ?- Tubular adenoma, 2 fragments.  No high-grade dysplasia or malignancy.  ?- Hyperplastic polyp, multiple fragments.  No dysplasia or malignancy.  ? ?Today we reviewed the risks, benefits and alternatives of surgery. After a full discussion and all questions answered the patient granted consent to proceed.  We will proceed as scheduled. ?  ? ?Findings: Small, fat containing hiatal hernia ? ?Infection status: ?Patient: Private Patient Elective Case ?Case: Elective ?Infection Present At Time Of Surgery (PATOS): Some spillage of  foregut  and jejunal contents while creating anastomoses ? ? ?Description of Procedure:  ? ?On the date stated above, the patient was taken to the operating room suite and placed in supine positioning.  General endotracheal anesthesia was induced.  A timeout was completed verifying the correct patient, procedure, positioning and equipment needed for the case.  The patient's abdomen was prepped and draped in the usual sterile fashion.  A 5 mm trocar was used to enter the right upper quadrant using optical technique.  The abdomen was entered safely without any trauma the underlying viscera.  Three additional incisions were made and 4 robotic trochars were placed across the abdomen, replacing the 5 mm trocar with the 12 mm robotic stapler trocar. An 73m assistant trocar was placed in the left lower quadrant. The [facility] was placed through the subxiphoid region and under the left lobe of the liver and was connected to the rail of the bed.  A TAP block was placed using marcaine and Exparel under direct vision of the laparoscope.  The [place] docked and we transitioned to robotic surgery. ? ?The gastrohepatic ligament was divided and the right crus was identified.  A blunt dissection was carried out between the right crus and the esophagus.  The phrenoesophageal ligament was divided and dissection was carried up over the top of the esophagus towards the left crus.  The fundus was partially mobilized off of the left hemidiaphragm.   ? ?  All posterior gastric attachments to the lesser sac and retroperitoneum were divided.  The left crus was further delineated.  A retroesophageal window was created and the tip up grasper was used lift the esophagus for retraction.   ? ?A high, circumferential mediastinal dissection was performed in an effort to mobilize the esophagus and provide for adequate intraabdominal esophageal length.  The mediastinal dissection was performed bluntly, with  little to no thermal energy.  The anterior and posterior vagus nerves were both identified and preserved. ? ?The crural defect was reapproximated with two, interrupted, 0 Ethibond sutures.  The crural pillars came together well without tearing of the adjacent diaphragmatic tissue.   ? ?Using the tip up grasper, fenestrated bipolar, 30 degree camera and Synchroseal from the patient's right to left, we began by dissecting the angle of His off the left crus of the diaphragm.  The adhesions between the stomach, spleen and diaphragm were divided using the Synchroseal to define the angle of His.  I then started 4-6 cm down on the lesser curve of the stomach and created a defect in the gastrohepatic ligament tracking behind the lesser curve of the stomach to enter the lesser sac.  I then used multiple white loads of the robotic 60 mm Sureform linear stapler to form the gastric pouch.  While exchanging the stapler for the tip up grasper, the tip up grasper was inserted through the left lobe of the liver creating a through and through injury to the liver.  It was slowly withdrawn and cautery was arced to the grasper while the metal was in the tract of the injury.  The tract was cauterized where it entered and exited the liver.  There was good hemostasis and no visible leakage of bile after this.  We proceeded with the remainder of the operation.  At the end of the operation a JP drain was brought through the left abdomen and laid near this injury and snow was placed over the sites of the injury.  I then directed my attention to the lower abdomen.  The omentum was divided with the Synchroseal and I identified the ligament of treitz.  The jejunum was run to a point 50 cm from the ligament of Treitz.  This loop of bowel was then brought into the left upper quadrant, over the transverse colon, between the split omentum.  A 2-0 v-loc suture was used to create the posterior outer row of the gastrojejunal anastomosis.  An  approximately 2 cm gastrotomy was made in the pouch and a matching 2 cm enterotomy was created in the roux limb.  Then, two 3-0 v-loc sutures were used to create a posterior, inner, full thickness layer of the an

## 2021-11-22 ENCOUNTER — Other Ambulatory Visit (HOSPITAL_COMMUNITY): Payer: Self-pay

## 2021-11-22 ENCOUNTER — Encounter (HOSPITAL_COMMUNITY): Payer: Self-pay | Admitting: Surgery

## 2021-11-22 LAB — CBC WITH DIFFERENTIAL/PLATELET
Abs Immature Granulocytes: 0.02 10*3/uL (ref 0.00–0.07)
Basophils Absolute: 0 10*3/uL (ref 0.0–0.1)
Basophils Relative: 0 %
Eosinophils Absolute: 0 10*3/uL (ref 0.0–0.5)
Eosinophils Relative: 0 %
HCT: 35.4 % — ABNORMAL LOW (ref 36.0–46.0)
Hemoglobin: 11.2 g/dL — ABNORMAL LOW (ref 12.0–15.0)
Immature Granulocytes: 0 %
Lymphocytes Relative: 18 %
Lymphs Abs: 1.9 10*3/uL (ref 0.7–4.0)
MCH: 27.4 pg (ref 26.0–34.0)
MCHC: 31.6 g/dL (ref 30.0–36.0)
MCV: 86.6 fL (ref 80.0–100.0)
Monocytes Absolute: 0.7 10*3/uL (ref 0.1–1.0)
Monocytes Relative: 7 %
Neutro Abs: 7.9 10*3/uL — ABNORMAL HIGH (ref 1.7–7.7)
Neutrophils Relative %: 75 %
Platelets: 255 10*3/uL (ref 150–400)
RBC: 4.09 MIL/uL (ref 3.87–5.11)
RDW: 14.7 % (ref 11.5–15.5)
WBC: 10.5 10*3/uL (ref 4.0–10.5)
nRBC: 0 % (ref 0.0–0.2)

## 2021-11-22 LAB — GLUCOSE, CAPILLARY
Glucose-Capillary: 106 mg/dL — ABNORMAL HIGH (ref 70–99)
Glucose-Capillary: 109 mg/dL — ABNORMAL HIGH (ref 70–99)
Glucose-Capillary: 108 mg/dL — ABNORMAL HIGH (ref 70–99)
Glucose-Capillary: 103 mg/dL — ABNORMAL HIGH (ref 70–99)

## 2021-11-22 MED ORDER — ONDANSETRON 4 MG PO TBDP
4.0000 mg | ORAL_TABLET | Freq: Four times a day (QID) | ORAL | 0 refills | Status: DC | PRN
  Filled 2021-11-22: qty 20, 5d supply, fill #0

## 2021-11-22 MED ORDER — OXYCODONE HCL 5 MG PO TABS
5.0000 mg | ORAL_TABLET | Freq: Four times a day (QID) | ORAL | 0 refills | Status: DC | PRN
  Filled 2021-11-22: qty 10, 3d supply, fill #0

## 2021-11-22 MED ORDER — ACETAMINOPHEN 500 MG PO TABS: 1000.0000 mg | ORAL_TABLET | Freq: Three times a day (TID) | ORAL | 0 refills | Status: AC

## 2021-11-22 MED ORDER — PANTOPRAZOLE SODIUM 40 MG PO TBEC
40.0000 mg | DELAYED_RELEASE_TABLET | Freq: Every day | ORAL | 0 refills | Status: DC
  Filled 2021-11-22: qty 90, 90d supply, fill #0

## 2021-11-22 NOTE — Progress Notes (Addendum)
Patient alert and oriented, pain is tolerable at the moment, working towards getting controlled. Patient is tolerating fluids, advanced to protein shake today, patient is tolerating well. Reviewed Gastric Bypass discharge instructions with patient and patient is able to articulate understanding. Provided information on BELT program, Support Group and [facility] outpatient pharmacy. All questions answered, will continue to monitor. ? ?Did education with pt and sister about JP drain care and management.  Will continue to f/u as needed. ? ?  ?

## 2021-11-22 NOTE — Discharge Instructions (Addendum)
GASTRIC BYPASS / SLEEVE  ?Home Care Instructions ? ?These instructions are to help you care for yourself when you go home. ? ?Call: If you have any problems. ?Call [phone] and ask for the surgeon on call ?If you have an emergency related to your surgery please use the ER at [place].  ?Tell the ER staff that you are a new post-op gastric bypass or gastric sleeve patient ?  ?Signs and symptoms to report: Severe vomiting or nausea ?If you cannot handle clear liquids for longer than 1 day, call your surgeon  ?Abdominal pain which does not get better after taking your pain medication ?Fever greater than 100.4? F and chills ?Heart rate over 100 beats a minute ?Trouble breathing ?Chest pain ? Redness, swelling, drainage, or foul odor at incision (surgical) sites ? If your incisions open or pull apart ?Swelling or pain in calf (lower leg) ?Diarrhea (Loose bowel movements that happen often), frequent watery, uncontrolled bowel movements ?Constipation, (no bowel movements for 3 days) if this happens:  ?Take Milk of Magnesia, 2 tablespoons by mouth, 3 times a day for 2 days if needed ?Stop taking Milk of Magnesia once you have had a bowel movement ?Call your doctor if constipation continues ?Or ?Take Miralax  (instead of Milk of Magnesia) following the label instructions ?Stop taking Miralax once you have had a bowel movement ?Call your doctor if constipation continues ?Anything you think is ?abnormal for you? ?  ?Normal side effects after surgery: Unable to sleep at night or unable to concentrate ?Irritability ?Being tearful (crying) or depressed ?These are common complaints, possibly related to your anesthesia, stress of surgery and change in lifestyle, that usually go away a few weeks after surgery.  If these feelings continue, call your medical doctor.  ?Wound Care: You may have surgical glue, steri-strips, or staples over your incisions after surgery ?Surgical glue:  Looks like a clear film over your incisions  and will wear off a little at a time ?Steri-strips : Adhesive strips of tape over your incisions. You may notice a yellowish color on the skin under the steri-strips. This is used to make the   steri-strips stick better. Do not pull the steri-strips off - let them fall off ?Staples: Staples may be removed before you leave the hospital ?If you go home with staples, call [facility] at for an appointment with your surgeon?s nurse to have staples removed 10 days after surgery, [phone] ?Showering: You may shower two (2) days after your surgery unless your surgeon tells you differently ?Wash gently around incisions with warm soapy water, rinse well, and gently pat dry  ?If you have a drain (tube from your incision), you may need someone to hold this while you shower  ?No tub baths until staples are removed and incisions are healed   ?  ?Medications: Medications should be liquid or crushed if larger than the size of a dime ?Extended release pills (medication that releases a little bit at a time through the day) should not be crushed ?Depending on the size and number of medications you take, you may need to space (take a few throughout the day)/change the time you take your medications so that you do not over-fill your pouch (smaller stomach) ?Make sure you follow-up with your primary care physician to make medication changes needed during rapid weight loss and life-style changes ?If you have diabetes, follow up with the doctor that orders your diabetes medication(s) within one week after surgery and check   your blood sugar regularly. ?Do not drive while taking narcotics (pain medications) ?DO NOT take NSAID'S (Examples of NSAID's include ibuprofen, naproxen)  ?Diet:                    First 2 Weeks ? You will see the nutritionist about two (2) weeks after your surgery. The nutritionist will increase the types of foods you can eat if you are handling liquids well: ?If you have severe vomiting or nausea  and cannot handle clear liquids lasting longer than 1 day, call your surgeon  ?Protein Shake ?Drink at least 2 ounces of shake 5-6 times per day ?Each serving of protein shakes (usually 8 - 12 ounces) should have a minimum of:  ?15 grams of protein  ?And no more than 5 grams of carbohydrate  ?Goal for protein each day: ?Men = 80 grams per day ?Women = 60 grams per day ?Protein powder may be added to fluids such as non-fat milk or Lactaid milk or Soy milk (limit to 35 grams added protein powder per serving) ? ?Hydration ?Slowly increase the amount of water and other clear liquids as tolerated (See Acceptable Fluids) ?Slowly increase the amount of protein shake as tolerated  ? Sip fluids slowly and throughout the day ?May use sugar substitutes in small amounts (no more than 6 - 8 packets per day; i.e. Splenda) ? ?Fluid Goal ?The first goal is to drink at least 8 ounces of protein shake/drink per day (or as directed by the nutritionist);  See handout from pre-op Bariatric Education Class for examples of protein shake/drink.   ?Slowly increase the amount of protein shake you drink as tolerated ?You may find it easier to slowly sip shakes throughout the day ?It is important to get your proteins in first ?Your fluid goal is to drink 64 - 100 ounces of fluid daily ?It may take a few weeks to build up to this ?32 oz (or more) should be clear liquids  ?And  ?32 oz (or more) should be full liquids (see below for examples) ?Liquids should not contain sugar, caffeine, or carbonation ? ?Clear Liquids: ?Water or Sugar-free flavored water (i.e. Fruit H2O, Propel) ?Decaffeinated coffee or tea (sugar-free) ?Teri Diltz Lite, Wyler?s Lite, Minute Maid Lite ?Sugar-free Jell-O ?Bouillon or broth ?Sugar-free Popsicle:   *Less than 20 calories each; Limit 1 per day ? ?Full Liquids: ?Protein Shakes/Drinks + 2 choices per day of other full liquids ?Full liquids must be: ?No More Than 12 grams of Carbs per serving  ?No More Than 3 grams of Fat  per serving ?Strained low-fat cream soup ?Non-Fat milk ?Fat-free Lactaid Milk ?Sugar-free yogurt (Dannon Lite & Fit, Greek yogurt) ? ? ? ?  ?Vitamins and Minerals Start 1 day after surgery unless otherwise directed by your surgeon ?Bariatric Specific Complete Multivitamins ?Chewable Calcium Citrate with Vitamin D-3 ?(Example: 3 Chewable Calcium Plus 600 with Vitamin D-3) ?Take 500 mg three (3) times a day for a total of 1500 mg each day ?Do not take all 3 doses of calcium at one time as it may cause constipation, and you can only absorb 500 mg  at a time  ?Do not mix multivitamins containing iron with calcium supplements; take 2 hours apart ? ?Menstruating women and those at risk for anemia (a blood disease that causes weakness) may need extra iron ?Talk with your doctor to see if you need more iron ?If you need extra iron: Total daily Iron recommendation (including Vitamins) is 50 to 100   mg Iron/day ?Do not stop taking or change any vitamins or minerals until you talk to your nutritionist or surgeon ?Your nutritionist and/or surgeon must approve all vitamin and mineral supplements ?  ?Activity and Exercise: It is important to continue walking at home.  Limit your physical activity as instructed by your doctor.  During this time, use these guidelines: ?Do not lift anything greater than ten (10) pounds for at least two (2) weeks ?Do not go back to work or drive until your surgeon says you can ?You may have sex when you feel comfortable  ?It is VERY important for female patients to use a reliable birth control method; fertility often increases after surgery  ?Do not get pregnant for at least 18 months ?Start exercising as soon as your doctor tells you that you can ?Make sure your doctor approves any physical activity ?Start with a simple walking program ?Walk 5-15 minutes each day, 7 days per week.  ?Slowly increase until you are walking 30-45 minutes per day ?Consider joining our BELT program. [phone] or email  [email or web address] ?  ?Special Instructions Things to remember: ? ?Use your CPAP when sleeping if this applies to you, do not stop the use of CPAP unless directed by physician after a sleep study ?[place]

## 2021-11-22 NOTE — Progress Notes (Signed)
Progress Note: General Surgery Service  ? ?Chief Complaint/Subjective: ?Pain in upper abdomen ? ?Objective: ?Vital signs in last 24 hours: ?Temp:  [97.8 ?F (36.6 ?C)-98.6 ?F (37 ?C)] 98 ?F (36.7 ?C) (03/29 4166) ?Pulse Rate:  [62-110] 80 (03/29 0837) ?Resp:  [13-20] 18 (03/29 0837) ?BP: (91-161)/(50-96) 115/68 (03/29 0630) ?SpO2:  [91 %-97 %] 93 % (03/29 0837) ?Last BM Date : 11/20/21 ? ?Intake/Output from previous day: ?03/28 0701 - 03/29 0700 ?In: 2500 [P.O.:480; I.V.:1920; IV Piggyback:100] ?Out: 1601 [Urine:1550; Drains:250; Blood:15] ?Intake/Output this shift: ?No intake/output data recorded. ? ?GI: Abd JP serosanguinous, incisions c/d/I; no palpable hepatosplenomegaly ? ? ?Lab Results: ?CBC  ?Recent Labs  ?  11/21/21 ?1409 11/22/21 ?0401  ?WBC 13.3* 10.5  ?HGB 12.4 11.2*  ?HCT 39.0 35.4*  ?PLT 290 255  ? ?BMET ?Recent Labs  ?  11/21/21 ?1409  ?CREATININE 0.90  ? ?PT/INR ?No results for input(s): LABPROT, INR in the last 72 hours. ?ABG ?No results for input(s): PHART, HCO3 in the last 72 hours. ? ?Invalid input(s): PCO2, PO2 ? ?Anti-infectives: ?Anti-infectives (From admission, onward)  ? ? Start     Dose/Rate Route Frequency Ordered Stop  ? 11/21/21 0600  cefoTEtan (CEFOTAN) 2 g in sodium chloride 0.9 % 100 mL IVPB       ? 2 g ?200 mL/hr over 30 Minutes Intravenous On call to O.R. 11/21/21 0932 11/21/21 0748  ? ?  ? ? ?Medications: ?Scheduled Meds: ? acetaminophen  1,000 mg Oral Q8H  ? Or  ? acetaminophen (TYLENOL) oral liquid 160 mg/5 mL  1,000 mg Oral Q8H  ? atorvastatin  20 mg Oral Daily  ? enoxaparin (LOVENOX) injection  30 mg Subcutaneous Q12H  ? insulin aspart  0-15 Units Subcutaneous TID WC  ? insulin aspart  0-5 Units Subcutaneous QHS  ? loratadine  10 mg Oral Daily  ? pantoprazole (PROTONIX) IV  40 mg Intravenous QHS  ? Ensure Max Protein  2 oz Oral Q2H  ? rOPINIRole  1 mg Oral QHS  ? ?Continuous Infusions: ? lactated ringers 75 mL/hr at 11/22/21 0127  ? ?PRN Meds:.fluticasone, morphine injection,  ondansetron (ZOFRAN) IV, oxyCODONE, simethicone ? ?Assessment/Plan: ?s/p Procedure(s): ?XI ROBOT ASSISTED ROUX-EN-Y WITH UPPER ENDO ?XI ROBOTIC ASSISTED HIATAL HERNIA REPAIR 11/21/2021 ? ?Pain in upper abdomen ?Tolerating liquids ?Walking ?Possibly home later today ? ? LOS: 1 day  ? ? ? ?Felicie Morn, MD ? ?[facility] ?Use [email or web address] to contact on call provider ? ?

## 2021-11-22 NOTE — TOC Initial Note (Signed)
Transition of Care (TOC) - Initial/Assessment Note  ? ? ?Patient Details  ?Name: Connie Hall ?MRN: [id number] ?Date of Birth: 1966-06-14 ? ?Transition of Care (TOC) CM/SW Contact:    ?Tawanna Cooler, RN ?Phone Number: ?11/22/2021, 1:26 PM ? ?Clinical Narrative:                 ? ? ?Transition of Care Department (TOC) has reviewed patient and no TOC needs have been identified at this time. We will continue to monitor patient advancement through interdisciplinary progression rounds. If new patient transition needs arise, please place a TOC consult. ?  ? ?Expected Discharge Plan: Home/Self Care ?  ?Expected Discharge Plan and Services ?Expected Discharge Plan: Home/Self Care ?  ?  ?  ?Living arrangements for the past 2 months: [place] ?                ?  ?Prior Living Arrangements/Services ?Living arrangements for the past 2 months: [place] ?Lives with:: Self ?Patient language and need for interpreter reviewed:: Yes ?Do you feel safe going back to the place where you live?: Yes      ?Need for Family Participation in Patient Care: No (Comment) ?Care giver support system in place?: Yes (comment) ?  ?Criminal Activity/Legal Involvement Pertinent to Current Situation/Hospitalization: No - Comment as needed ? ?Activities of Daily Living ?Home Assistive Devices/Equipment: None ?ADL Screening (condition at time of admission) ?Patient's cognitive ability adequate to safely complete daily activities?: Yes ?Is the patient deaf or have difficulty hearing?: No ?Does the patient have difficulty seeing, even when wearing glasses/contacts?: No ?Does the patient have difficulty concentrating, remembering, or making decisions?: No ?Patient able to express need for assistance with ADLs?: Yes ?Does the patient have difficulty dressing or bathing?: No ?Independently performs ADLs?: Yes (appropriate for developmental age) ?Does the patient have difficulty walking or climbing stairs?: No ?Weakness of Legs:  None ?Weakness of Arms/Hands: None ? ? ?Emotional Assessment ?Appearance:: Appears stated age ?  ?  ?Orientation: : Oriented to Self, Oriented to Place, Oriented to  Time, Oriented to Situation ?Alcohol / Substance Use: Not Applicable ?Psych Involvement: No (comment) ? ?Admission diagnosis:  Morbid obesity ([place]) [E66.01] ?Patient Active Problem List  ? Diagnosis Date Noted  ? Encounter for preoperative examination for general surgical procedure 06/15/2021  ? Morbid obesity ([place]) 06/15/2021  ? Colon cancer screening 06/15/2021  ? Complication of anesthesia 06/15/2021  ? Impaired glucose tolerance 09/15/2017  ? Genetic testing 11/21/2016  ? Family history of pancreatic cancer   ? Family history of prostate cancer   ? Carpal tunnel syndrome 05/11/2011  ? Dependent edema 05/11/2011  ? Class 3 obesity ([place]) 05/11/2011  ? Obstructive sleep apnea 11/03/2009  ? ALLERGIC RHINITIS 10/21/2009  ? LUPUS ERYTHEMATOSUS, DISCOID 10/21/2009  ? HYPERSOMNIA 10/21/2009  ? Vitamin D deficiency 10/20/2009  ? Hyperlipidemia 10/20/2009  ? RESTLESS LEG SYNDROME 10/20/2009  ? ?PCP:  Elby Showers, MD ?Pharmacy:   ?[facility] ?[address] ?[place] [address] ?Phone: [phone] Fax: [phone] ? ? ?

## 2021-11-22 NOTE — Progress Notes (Signed)
Patient alert and oriented, Post op day 1.  Provided support and encouragement.  Encouraged pulmonary toilet, ambulation and small sips of liquids.  All questions answered.  Will continue to monitor. 

## 2021-11-23 ENCOUNTER — Other Ambulatory Visit (HOSPITAL_COMMUNITY): Payer: Self-pay

## 2021-11-23 LAB — CBC WITH DIFFERENTIAL/PLATELET
Abs Immature Granulocytes: 0.03 10*3/uL (ref 0.00–0.07)
Basophils Absolute: 0.1 10*3/uL (ref 0.0–0.1)
Basophils Relative: 1 %
Eosinophils Absolute: 0.1 10*3/uL (ref 0.0–0.5)
Eosinophils Relative: 2 %
HCT: 35 % — ABNORMAL LOW (ref 36.0–46.0)
Hemoglobin: 11.4 g/dL — ABNORMAL LOW (ref 12.0–15.0)
Immature Granulocytes: 0 %
Lymphocytes Relative: 20 %
Lymphs Abs: 1.7 10*3/uL (ref 0.7–4.0)
MCH: 28.4 pg (ref 26.0–34.0)
MCHC: 32.6 g/dL (ref 30.0–36.0)
MCV: 87.1 fL (ref 80.0–100.0)
Monocytes Absolute: 0.5 10*3/uL (ref 0.1–1.0)
Monocytes Relative: 6 %
Neutro Abs: 6.4 10*3/uL (ref 1.7–7.7)
Neutrophils Relative %: 71 %
Platelets: 252 10*3/uL (ref 150–400)
RBC: 4.02 MIL/uL (ref 3.87–5.11)
RDW: 15 % (ref 11.5–15.5)
WBC: 8.8 10*3/uL (ref 4.0–10.5)
nRBC: 0 % (ref 0.0–0.2)

## 2021-11-23 LAB — GLUCOSE, CAPILLARY: Glucose-Capillary: 108 mg/dL — ABNORMAL HIGH (ref 70–99)

## 2021-11-23 NOTE — Progress Notes (Signed)
Pt given DC packet and DC instructions, all questions answered. IV removed, pt taken downstairs with staff member ?

## 2021-11-23 NOTE — Discharge Summary (Signed)
?Patient ID: ?Connie Hall ?[id number] ?56 y.o. ?[birth year] ? ?11/21/2021 ? ?Discharge date and time: 11/23/2021 ? ?Admitting Physician: Nickola Major Rachid Parham ? ?Discharge Physician: Nickola Major Irianna Gilday ? ?Admission Diagnoses: Morbid obesity ([place]) [E66.01] ?Patient Active Problem List  ? Diagnosis Date Noted  ? Encounter for preoperative examination for general surgical procedure 06/15/2021  ? Morbid obesity ([place]) 06/15/2021  ? Colon cancer screening 06/15/2021  ? Complication of anesthesia 06/15/2021  ? Impaired glucose tolerance 09/15/2017  ? Genetic testing 11/21/2016  ? Family history of pancreatic cancer   ? Family history of prostate cancer   ? Carpal tunnel syndrome 05/11/2011  ? Dependent edema 05/11/2011  ? Class 3 obesity ([place]) 05/11/2011  ? Obstructive sleep apnea 11/03/2009  ? ALLERGIC RHINITIS 10/21/2009  ? LUPUS ERYTHEMATOSUS, DISCOID 10/21/2009  ? HYPERSOMNIA 10/21/2009  ? Vitamin D deficiency 10/20/2009  ? Hyperlipidemia 10/20/2009  ? RESTLESS LEG SYNDROME 10/20/2009  ? ? ? ?Discharge Diagnoses: Morbid obesity ?Patient Active Problem List  ? Diagnosis Date Noted  ? Encounter for preoperative examination for general surgical procedure 06/15/2021  ? Morbid obesity ([place]) 06/15/2021  ? Colon cancer screening 06/15/2021  ? Complication of anesthesia 06/15/2021  ? Impaired glucose tolerance 09/15/2017  ? Genetic testing 11/21/2016  ? Family history of pancreatic cancer   ? Family history of prostate cancer   ? Carpal tunnel syndrome 05/11/2011  ? Dependent edema 05/11/2011  ? Class 3 obesity ([place]) 05/11/2011  ? Obstructive sleep apnea 11/03/2009  ? ALLERGIC RHINITIS 10/21/2009  ? LUPUS ERYTHEMATOSUS, DISCOID 10/21/2009  ? HYPERSOMNIA 10/21/2009  ? Vitamin D deficiency 10/20/2009  ? Hyperlipidemia 10/20/2009  ? RESTLESS LEG SYNDROME 10/20/2009  ? ? ?Operations: Procedure(s): ?XI ROBOT ASSISTED ROUX-EN-Y WITH UPPER ENDO ?XI ROBOTIC ASSISTED HIATAL HERNIA REPAIR ? ?Admission Condition: good ? ?Discharged  Condition: good ? ?Indication for Admission: Morbid Obesity ? ?Hospital Course: Ms. Bergthold presented for elective robotic gastric bypass with hiatal hernia repair.  She recovered well and was discharged.  Surgery was complicated by an injury to the liver so a JP drain was placed and will be removed in office. ? ?Consults:  None ? ?Significant Diagnostic Studies: None ? ?Treatments: surgery: As above ? ?Disposition: Home ? ?Patient Instructions:  ?Allergies as of 11/23/2021   ? ?   Reactions  ? Sulfa Antibiotics Hives  ? Sulfonamide Derivatives Hives  ? ?  ? ?  ?Medication List  ?  ? ?TAKE these medications   ? ?acetaminophen 500 MG tablet ?Commonly known as: TYLENOL ?Take 2 tablets (1,000 mg total) by mouth every 8 (eight) hours for 5 days. ?  ?Allegra-D Allergy & Congestion 60-120 MG 12 hr tablet ?Generic drug: fexofenadine-pseudoephedrine ?TAKE 1 TABLET BY MOUTH TWICE A DAY ?What changed:  ?when to take this ?reasons to take this ?  ?atorvastatin 20 MG tablet ?Commonly known as: LIPITOR ?Take 1 tablet by mouth daily. ?  ?fluticasone 50 MCG/ACT nasal spray ?Commonly known as: FLONASE ?USE 2 SPRAYS IN EACH NOSTRIL ONCE A DAY ?What changed:  ?when to take this ?reasons to take this ?  ?glucose blood test strip ?Use as instructed 2x a day - for One Touch Verio Reflect ?  ?metFORMIN 1000 MG tablet ?Commonly known as: GLUCOPHAGE ?TAKE 1 TABLET BY MOUTH DAILY WITH SUPPER ?Notes to patient: Monitor Blood Sugar Frequently and keep a log for primary care physician, you may need to adjust medication dosage with rapid weight loss.   ?  ?metroNIDAZOLE 0.75 % cream ?Commonly known as: METROCREAM ?Apply 1  application on the skin twice a day ?What changed: when to take this ?  ?omeprazole 20 MG capsule ?Commonly known as: PRILOSEC ?Take 1 capsule (20 mg total) by mouth daily. ?  ?ondansetron 4 MG disintegrating tablet ?Commonly known as: ZOFRAN-ODT ?Take 1 tablet (4 mg total) by mouth every 6 (six) hours as needed for nausea or  vomiting. ?  ?OneTouch Delica Lancets 61W Misc ?Use 2x a day - for One Touch Verio Reflect ?  ?oxyCODONE 5 MG immediate release tablet ?Commonly known as: Oxy IR/ROXICODONE ?Take 1 tablet (5 mg total) by mouth every 6 (six) hours as needed for severe pain. ?  ?Ozempic (1 MG/DOSE) 4 MG/3ML Sopn ?Generic drug: Semaglutide (1 MG/DOSE) ?Inject 1 mg into the skin once a week. ?What changed: when to take this ?  ?pantoprazole 40 MG tablet ?Commonly known as: PROTONIX ?Take 1 tablet (40 mg total) by mouth daily. ?  ?rOPINIRole 0.5 MG tablet ?Commonly known as: REQUIP ?TAKE 1 TABLET BY MOUTH EVERY AFTERNOON AND 2 TABLETS AT BEDTIME ?What changed:  ?how much to take ?how to take this ?when to take this ?  ?Vitamin D3 125 MCG (5000 UT) Caps ?Take 5,000 Units by mouth daily. ?  ? ?  ? ? ?Activity: no heavy lifting for 4 weeks ?Diet:  Bariatric diet protocol ?Wound Care: keep wound clean and dry ? ?Follow-up:  With Dr. Thermon Leyland in 7-10 days ? ?Signed: ?Nickola Major Shervon Kerwin ?General, Bariatric, & Minimally Invasive Surgery ?[facility], [place] ? ? ?11/23/2021, 8:22 AM ? ?

## 2021-11-24 ENCOUNTER — Telehealth (HOSPITAL_COMMUNITY): Payer: Self-pay | Admitting: *Deleted

## 2021-11-25 ENCOUNTER — Telehealth (HOSPITAL_COMMUNITY): Payer: Self-pay | Admitting: *Deleted

## 2021-11-25 NOTE — Telephone Encounter (Signed)
1.  Tell me about your pain and pain management? ?Pt denies any severe pain. States that the pain feels more like "gas pain".  Has not needed to take anything other than Tylenol. ? ?2.  Let's talk about fluid intake.  How much total fluid are you taking in? ?Pt states that she is getting in more than 64oz of fluid including protein shakes, bottled water, Gatorade Zero protein and yogurt. ?Pt instructed to assess status and suggestions daily utilizing Hydration Action Plan on discharge folder and to call [facility] if in the "red zone".  ? ?3.  How much protein have you taken in the last 2 days? ?Pt states she is meeting her goal of 60g of protein each day with the protein shakes, Gatorade Zero protein and yogurt. ? ?4.  Have you had nausea?  Tell me about when have experienced nausea and what you did to help? ?Pt denies nausea. ?  ?5.  Has the frequency or color changed with your urine? ?Pt states that she is urinating "fine" with no changes in frequency or urgency.   ?  ?6.  Tell me what your incisions look like? ?"Incisions look fine except for the one beside the drain, it was bleeding a little bit". Pt denies a fever, chills.  Pt states other incisions are not swollen, open, or draining.  Pt encouraged to call [facility] if incisions change or feels concern about the one incision.  ?JP drain still intact. Pt is emptying drain at least twice daily. States it drained 23m on Thurs 3/30, 1567mon Friday 3/31 and has drained 8056mo far today Saturday 11/25/21.  Pt instructed to continue to measure output and take log to f/u MD appt on Thursday 11/30/21. ?  ?7.  Have you been passing gas? BM? ?Pt states that she has not had a BM.  Pt instructed to take either Miralax or MoM as instructed per "Gastric Bypass/Sleeve Discharge Home Care Instructions".  Pt to call surgeon's office if not able to have BM with medication. ?  ?8.  If a problem or question were to arise who would you call?  Do you know contact numbers for [facility], and  [facility]? ?Pt denies dehydration symptoms.  Pt can describe s/sx of dehydration.  Pt knows to call [facility] for surgical, [facility] for nutrition, and [facility] non-urgent questions or concerns. ?  ?9.  How has the walking going? ?Pt states she is walking around and able to be active without difficulty. ?  ?10. Are you still using your incentive spirometer?  If so, how often? ?Pt states that she is doing the I.S. at least 6-7x/day.  Pt encouraged to use incentive spirometer, at least 10x every hour while awake until she sees the surgeon. ? ?11.  How are your vitamins and calcium going?  How are you taking them? ?Pt states that she is taking her supplements and vitamins without difficulty. ? ? ?Reminded patient that the first 30 days post-operatively are important for successful recovery.  Practice good hand hygiene, wearing a mask when appropriate (since optional in most places), and minimizing exposure to people who live outside of the home, especially if they are exhibiting any respiratory, GI, or illness-like symptoms.   ? ?

## 2021-11-29 ENCOUNTER — Telehealth (HOSPITAL_COMMUNITY): Payer: Self-pay | Admitting: *Deleted

## 2021-11-29 DIAGNOSIS — R5082 Postprocedural fever: Secondary | ICD-10-CM | POA: Diagnosis not present

## 2021-11-29 NOTE — Telephone Encounter (Signed)
Pt's family called expressing concern. Spoke with pt who shared that she had a temp of 101 and some tachycardia.  Pt had already called [facility] and spoken with a MD who gave the pt orders/instructions.  Pt felt "ok" with the plan.  Pt took some tylenol and would continue to monitor symptoms.  Instructed pt to call back to [facility] or go the [facility] ED if continued to develop temp >100.4, tachycardia.  Dr. Thermon Leyland was made aware.  Will continue to monitor and f/u with pt as needed. ?

## 2021-11-30 ENCOUNTER — Other Ambulatory Visit: Payer: Self-pay | Admitting: Internal Medicine

## 2021-11-30 ENCOUNTER — Other Ambulatory Visit (HOSPITAL_COMMUNITY): Payer: Self-pay

## 2021-11-30 MED ORDER — METFORMIN HCL 1000 MG PO TABS
ORAL_TABLET | Freq: Every day | ORAL | 1 refills | Status: DC
  Filled 2021-11-30: qty 90, 90d supply, fill #0
  Filled 2022-02-26: qty 90, 90d supply, fill #1

## 2021-11-30 MED ORDER — AMOXICILLIN-POT CLAVULANATE 875-125 MG PO TABS
ORAL_TABLET | ORAL | 0 refills | Status: DC
  Filled 2021-11-30: qty 14, 7d supply, fill #0

## 2021-12-01 ENCOUNTER — Other Ambulatory Visit (HOSPITAL_COMMUNITY): Payer: Self-pay

## 2021-12-05 ENCOUNTER — Encounter: Payer: 59 | Attending: Surgery | Admitting: Skilled Nursing Facility1

## 2021-12-05 DIAGNOSIS — Z9884 Bariatric surgery status: Secondary | ICD-10-CM | POA: Diagnosis not present

## 2021-12-05 DIAGNOSIS — Z6841 Body Mass Index (BMI) 40.0 and over, adult: Secondary | ICD-10-CM | POA: Diagnosis not present

## 2021-12-05 DIAGNOSIS — Z713 Dietary counseling and surveillance: Secondary | ICD-10-CM | POA: Insufficient documentation

## 2021-12-06 NOTE — Progress Notes (Signed)
2 Week Post-Operative Nutrition Class ?  ?Patient was seen on 12/05/2021 for Post-Operative Nutrition education at [facility].  ?  ?Surgery date: 11/21/2021 ?Surgery type: RYGB ?Start weight at [facility]: 251.4 ?Weight today: 234.3 ?Bowel Habits: Every day to every other day no complaints ?  ?Body Composition Scale Date  ?Current Body Weight 234.3  ?Total Body Fat % 48  ?Visceral Fat 20  ?Fat-Free Mass % 51.9  ? Total Body Water % 40.4  ?Muscle-Mass lbs 26.6  ?BMI 47.4  ?Body Fat Displacement   ?       Torso  lbs 69.6  ?       Left Leg  lbs 13.9  ?       Right Leg  lbs 13.9  ?       Left Arm  lbs 6.9  ?       Right Arm   lbs 6.9  ? ? ?  ?The following the learning objectives were met by the patient during this course: ?Identifies Phase 3 (Soft, High Proteins) Dietary Goals and will begin from 2 weeks post-operatively to 2 months post-operatively ?Identifies appropriate sources of fluids and proteins  ?Identifies appropriate fat sources and healthy verses unhealthy fat types   ?States protein recommendations and appropriate sources post-operatively ?Identifies the need for appropriate texture modifications, mastication, and bite sizes when consuming solids ?Identifies appropriate fat consumption and sources ?Identifies appropriate multivitamin and calcium sources post-operatively ?Describes the need for physical activity post-operatively and will follow MD recommendations ?States when to call healthcare provider regarding medication questions or post-operative complications ?  ?Handouts given during class include: ?Phase 3A: Soft, High Protein Diet Handout ?Phase 3 High Protein Meals ?Healthy Fats ?  ?Follow-Up Plan: ?Patient will follow-up at [facility] in 6 weeks for 2 month post-op nutrition visit for diet advancement per MD.  ?

## 2021-12-11 ENCOUNTER — Telehealth: Payer: Self-pay | Admitting: Skilled Nursing Facility1

## 2021-12-11 NOTE — Telephone Encounter (Signed)
RD called pt to verify fluid intake once starting soft, solid proteins 2 week post-bariatric surgery.   Daily Fluid intake:  Daily Protein intake: Bowel Habits:   Concerns/issues:    LVM 

## 2021-12-19 DIAGNOSIS — H59811 Chorioretinal scars after surgery for detachment, right eye: Secondary | ICD-10-CM | POA: Diagnosis not present

## 2021-12-19 DIAGNOSIS — E119 Type 2 diabetes mellitus without complications: Secondary | ICD-10-CM | POA: Diagnosis not present

## 2021-12-19 DIAGNOSIS — H43811 Vitreous degeneration, right eye: Secondary | ICD-10-CM | POA: Diagnosis not present

## 2021-12-19 DIAGNOSIS — H5213 Myopia, bilateral: Secondary | ICD-10-CM | POA: Diagnosis not present

## 2022-01-01 DIAGNOSIS — G4733 Obstructive sleep apnea (adult) (pediatric): Secondary | ICD-10-CM | POA: Diagnosis not present

## 2022-01-09 ENCOUNTER — Encounter: Payer: Self-pay | Admitting: Skilled Nursing Facility1

## 2022-01-09 ENCOUNTER — Encounter: Payer: 59 | Attending: Surgery | Admitting: Skilled Nursing Facility1

## 2022-01-09 DIAGNOSIS — Z713 Dietary counseling and surveillance: Secondary | ICD-10-CM | POA: Insufficient documentation

## 2022-01-09 DIAGNOSIS — Z9884 Bariatric surgery status: Secondary | ICD-10-CM | POA: Insufficient documentation

## 2022-01-09 DIAGNOSIS — Z6841 Body Mass Index (BMI) 40.0 and over, adult: Secondary | ICD-10-CM | POA: Insufficient documentation

## 2022-01-09 NOTE — Progress Notes (Signed)
Bariatric Nutrition Follow-Up Visit ?Medical Nutrition Therapy  ?Appt Start Time: 10:30   End Time: 11:00 ? ? ?NUTRITION ASSESSMENT ?  ? ?Anthropometrics  ?Surgery date: 11/21/2021 ?Surgery type: RYGB ?Start weight at [facility]: 251.4 ?Weight today: 218 pounds ?  ?Body Composition Scale Date 01/09/2022  ?Current Body Weight 234.3 218  ?Total Body Fat % 48 46.4  ?Visceral Fat 20 18  ?Fat-Free Mass % 51.9 53.5  ? Total Body Water % 40.4 41.2  ?Muscle-Mass lbs 26.6 26.5  ?BMI 47.4 44.0  ?Body Fat Displacement    ?       Torso  lbs 69.6 62.6  ?       Left Leg  lbs 13.9 12.5  ?       Right Leg  lbs 13.9 12.5  ?       Left Arm  lbs 6.9 6.2  ?       Right Arm   lbs 6.9 6.2  ? ?Clinical  ?Medical hx: DM, hyperlipemia, discoid lupus ?Medications: ozempic, metformin  ?Labs: A1C 5.8, HDL 41, Triglycerides 192 ?Notable signs/symptoms: N/A ?Any previous deficiencies? Vitamin D, Low iron ?  ?Lifestyle & Dietary Hx ? ?Pt states she struggles getting water down. She states is tolerating room temp water better.  ?Pt states she  Vomited twice stating it was from eating too fast. ?Pt May not be tolerating the protein shakes. ?Pt states she will try yogurt and cottage cheese for breakfast.  ?Pt states she is traveling for work a lot, and states she is doing well with travel and eating. ?Pt asked for recommendations for non-refrigerated proteins when traveling dietitian offered:  Dried edamame, Nuts, seeds, jerky, protein bars (like Fitjoy, Quest, Atkins) ? ?Estimated daily fluid intake: 32+ oz ?Estimated daily protein intake: 40-60 g ?Supplements: multi, calcium ?Current average weekly physical activity: walking a mile or two four times a week, palates an hour once a week.  Pt states piliates is great. ? ?24-Hr Dietary Recall ?First Meal: 1/4 of Fairlife protein shake ?Snack:   ?Second Meal: string cheese ?Snack:   ?Third Meal: 3.13 ounces buffalo chicken, diced green chilis or 3 or 4 bites protein powder with pudding.  ?Snack:  ?Beverages:  water, gatorade zero, water flavorings ? ?Post-Op Goals/ Signs/ Symptoms ?Using straws: no ?Drinking while eating: no ?Chewing/swallowing difficulties: no ?Changes in vision: no ?Changes to mood/headaches: no ?Hair loss/changes to skin/nails: no ?Difficulty focusing/concentrating: no ?Sweating: no ?Limb weakness: no ?Dizziness/lightheadedness: no ?Palpitations: no  ?Carbonated/caffeinated beverages: no ?N/V/D/C/Gas: no ?Abdominal pain: no ?Dumping syndrome: no ? ?  ?NUTRITION DIAGNOSIS  ?Overweight/obesity ([place]-3.3) related to past poor dietary habits and physical inactivity as evidenced by completed bariatric surgery and following dietary guidelines for continued weight loss and healthy nutrition status. ?  ?  ?NUTRITION INTERVENTION ?Nutrition counseling (C-1) and education (E-2) to facilitate bariatric surgery goals, including: ?Diet advancement to the next phase (phase 4) now including non starchy vegetables ?The importance of consuming adequate calories as well as certain nutrients daily due to the body's need for essential vitamins, minerals, and fats ?The importance of daily physical activity and to reach a goal of at least 150 minutes of moderate to vigorous physical activity weekly (or as directed by their physician) due to benefits such as increased musculature and improved lab values ?The importance of intuitive eating specifically learning hunger-satiety cues and understanding the importance of learning a new body: The importance of mindful eating to avoid grazing behaviors  ? ?Handouts Provided Include  ?Phase  4 ? ?Learning Style & Readiness for Change ?Teaching method utilized: Visual & Auditory  ?Demonstrated degree of understanding via: Teach Back  ?Readiness Level: action ?Barriers to learning/adherence to lifestyle change: none identified  ? ?RD's Notes for Next Visit ?Assess adherence to pt chosen goals  ? ? ?MONITORING & EVALUATION ?Dietary intake, weekly physical activity, body weight ? ?Next  Steps ?Patient is to follow-up in August for 6 month post-op class. ?

## 2022-01-15 ENCOUNTER — Other Ambulatory Visit (HOSPITAL_COMMUNITY): Payer: Self-pay

## 2022-02-15 ENCOUNTER — Other Ambulatory Visit (HOSPITAL_COMMUNITY): Payer: Self-pay

## 2022-02-26 ENCOUNTER — Other Ambulatory Visit (HOSPITAL_COMMUNITY): Payer: Self-pay

## 2022-02-26 ENCOUNTER — Other Ambulatory Visit: Payer: Self-pay | Admitting: Internal Medicine

## 2022-03-01 ENCOUNTER — Ambulatory Visit (INDEPENDENT_AMBULATORY_CARE_PROVIDER_SITE_OTHER): Payer: 59 | Admitting: Internal Medicine

## 2022-03-01 ENCOUNTER — Encounter: Payer: Self-pay | Admitting: Internal Medicine

## 2022-03-01 ENCOUNTER — Other Ambulatory Visit (HOSPITAL_COMMUNITY): Payer: Self-pay

## 2022-03-01 VITALS — BP 128/76 | HR 76 | Ht 59.0 in | Wt 198.0 lb

## 2022-03-01 DIAGNOSIS — E1165 Type 2 diabetes mellitus with hyperglycemia: Secondary | ICD-10-CM | POA: Diagnosis not present

## 2022-03-01 DIAGNOSIS — E669 Obesity, unspecified: Secondary | ICD-10-CM

## 2022-03-01 DIAGNOSIS — E785 Hyperlipidemia, unspecified: Secondary | ICD-10-CM | POA: Diagnosis not present

## 2022-03-01 LAB — POCT GLYCOSYLATED HEMOGLOBIN (HGB A1C): Hemoglobin A1C: 5.3 % (ref 4.0–5.6)

## 2022-03-01 MED ORDER — OZEMPIC (1 MG/DOSE) 4 MG/3ML ~~LOC~~ SOPN
1.0000 mg | PEN_INJECTOR | SUBCUTANEOUS | 3 refills | Status: DC
  Filled 2022-03-01 – 2022-04-19 (×2): qty 9, 84d supply, fill #0
  Filled 2022-07-11: qty 3, 28d supply, fill #1
  Filled 2022-08-08: qty 3, 28d supply, fill #2
  Filled 2022-09-04: qty 9, 84d supply, fill #3
  Filled 2022-09-13 (×2): qty 3, 28d supply, fill #3
  Filled 2022-10-05: qty 9, 84d supply, fill #4
  Filled 2022-11-13: qty 3, 28d supply, fill #4
  Filled 2022-12-06: qty 3, 28d supply, fill #5
  Filled 2023-01-04: qty 3, 28d supply, fill #6
  Filled 2023-02-01: qty 9, 84d supply, fill #7

## 2022-03-01 NOTE — Patient Instructions (Addendum)
Please stop: - Metformin   Continue: - Ozempic 1 mg weekly   Please return in 6 months with your sugar log.

## 2022-03-01 NOTE — Progress Notes (Signed)
Patient ID: Connie Hall, female   DOB: July 27, 1966, 56 y.o.   MRN: [id number]   This visit occurred during the SARS-CoV-2 public health emergency.  Safety protocols were in place, including screening questions prior to the visit, additional usage of staff PPE, and extensive cleaning of exam room while observing appropriate contact time as indicated for disinfecting solutions.   HPI: Connie Hall is a 56 y.o.-year-old female, referred by her PCP, Dr. Renold Genta, for management of DM2, dx at least in 2012, without long-term complications and also obesity.  Last visit 4 months ago.  Interim history: She had gastric bypass (R en Y) surgery with Dr. Thermon Leyland ([place]) - 11/21/2021. She lost 60 lbs.  She feels great! No increased urination, blurry vision, nausea, chest pain.   She eats low carb: mostly veggies, yoghurt, shake, wraps.  Reviewed history: She was seen in the weight management clinic by Dr. Leafy Ro. Also tried Weight watchers, Optivia, other meal-replacement plans, w/o good results. She gained ~50 lbs in last 5 years.   At our visit from 01/2021, she was contemplating weight loss surgery, which was employed by 2 of her sisters, with success.  Reviewed HbA1c levels: Lab Results  Component Value Date   HGBA1C 6.1 (H) 11/16/2021   HGBA1C 5.8 (H) 06/06/2021   HGBA1C 6.2 (A) 01/26/2021   HGBA1C 7.1 (A) 09/27/2020   HGBA1C 6.5 (H) 05/13/2020   HGBA1C 7.0 (A) 02/05/2020   HGBA1C 6.7 (H) 04/21/2019   HGBA1C 6.4 (H) 09/09/2018   HGBA1C 5.8 (H) 03/11/2018   HGBA1C 6.2 (H) 12/25/2017   HGBA1C 6.0 (H) 08/08/2017   HGBA1C 6.1 (H) 02/07/2017   HGBA1C 6.1 (H) 07/26/2016   HGBA1C 6.4 (H) 02/02/2016   HGBA1C 6.1 (H) 08/02/2015   HGBA1C 6.3 (H) 01/18/2015   HGBA1C 6.2 (H) 07/06/2014   HGBA1C 6.0 (H) 01/05/2014   HGBA1C 5.9 (H) 07/03/2013   HGBA1C 5.5 12/30/2012   She is on: - Ozempic 0.25 >> 0.5 >> 1 mg weekly - Metformin 500 >> 1000 mg with dinner-added 09/2020  Pt checks her sugars  seldom: - am: 90-130 (cookie) >> 80-100 >> 90s >> 80-90s - 2h after b'fast: n/c  - lunch: n/c - 2h after lunch: 100-115 >> 70-90 >> 110-115 >> n/c - dinner: n/c - 2h after dinner: up to 160 >> < or =160 >> 103 >> 90-100 - bedtime: n/c Lowest sugar was 80 >> 88 >> 70 >> 90 >> 80; it is unclear at which level she has hypoglycemia awareness. Highest sugar was 160 >> 160 >> 118 >> 110.  -No CKD, last BUN/creatinine:  Lab Results  Component Value Date   BUN 23 (H) 11/16/2021   BUN 14 06/06/2021   CREATININE 0.90 11/21/2021   CREATININE 0.82 11/16/2021  She is not on ACE inhibitor or ARB.  -+ Dyslipidemia; last set of lipids: Lab Results  Component Value Date   CHOL 150 06/06/2021   HDL 41 (L) 06/06/2021   LDLCALC 80 06/06/2021   TRIG 192 (H) 06/06/2021   CHOLHDL 3.7 06/06/2021  On Lipitor 10.  - last eye exam was in 10/2021: No DR.+ history of retinal holes.  - she denies numbness and tingling in her feet.  Pt has FH of DM in father (died of pancreatic cancer), 4 sisters.  No h/o pancreatitis or FH of MTC.  She has a history of OSA.  ROS: + see HPI  I reviewed pt's medications, allergies, PMH, social hx, family hx, and changes were documented in the  history of present illness. Otherwise, unchanged from my initial visit note.  Past Medical History:  Diagnosis Date   Back pain    Complication of anesthesia    low O2 sat   Deviated nasal septum    Discoid lupus    DM (diabetes mellitus) (HCC)    Family history of pancreatic cancer    Family history of prostate cancer    History of discoid lupus erythematosus    Hyperlipidemia    Leg edema    Obesity    OSA (obstructive sleep apnea)    Restless legs syndrome (RLS)    Vitamin D deficiency    Past Surgical History:  Procedure Laterality Date   BIOPSY  07/24/2021   Procedure: BIOPSY;  Surgeon: Irving Copas., MD;  Location: [facility];  Service: Gastroenterology;;   COLONOSCOPY WITH PROPOFOL N/A  07/24/2021   Procedure: COLONOSCOPY WITH PROPOFOL;  Surgeon: Irving Copas., MD;  Location: [facility];  Service: Gastroenterology;  Laterality: N/A;   ESOPHAGOGASTRODUODENOSCOPY (EGD) WITH PROPOFOL N/A 07/24/2021   Procedure: ESOPHAGOGASTRODUODENOSCOPY (EGD) WITH PROPOFOL;  Surgeon: Rush Landmark Telford Nab., MD;  Location: [facility];  Service: Gastroenterology;  Laterality: N/A;   POLYPECTOMY  07/24/2021   Procedure: POLYPECTOMY;  Surgeon: Rush Landmark Telford Nab., MD;  Location: [facility];  Service: Gastroenterology;;   ROTATOR CUFF REPAIR  2009   left   Sebaceous cyst removal, back  03/27/2008   XI ROBOTIC ASSISTED HIATAL HERNIA REPAIR N/A 11/21/2021   Procedure: XI ROBOTIC ASSISTED HIATAL HERNIA REPAIR;  Surgeon: Felicie Morn, MD;  Location: [facility];  Service: General;  Laterality: N/A;   Social History   Socioeconomic History   Marital status: Single    Spouse name: Not on file   Number of children: 0   Years of education: Not on file   Highest education level: Not on file  Occupational History   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    Employer: [employer]  Tobacco Use   Smoking status: Former    Packs/day: 1.00    Years: 15.00    Total pack years: 15.00    Types: Cigarettes    Quit date: 08/27/1998    Years since quitting: 23.5   Smokeless tobacco: Never  Vaping Use   Vaping Use: Never used  Substance and Sexual Activity   Alcohol use: Yes    Comment: rare   Drug use: No   Sexual activity: Not on file  Other Topics Concern   Not on file  Social History Narrative   Not on file   Social Determinants of Health   Financial Resource Strain: Not on file  Food Insecurity: Not on file  Transportation Needs: Not on file  Physical Activity: Not on file  Stress: Not on file  Social Connections: Not on file  Intimate Partner Violence: Not on file   Current Outpatient Medications on File Prior to Visit  Medication Sig Dispense Refill   ALLEGRA-D  ALLERGY & CONGESTION 60-120 MG 12 hr tablet TAKE 1 TABLET BY MOUTH TWICE A DAY (Patient taking differently: Take 1 tablet by mouth daily as needed (allergies).) 180 tablet 2   atorvastatin (LIPITOR) 20 MG tablet Take 1 tablet by mouth daily. 90 tablet 3   Cholecalciferol (VITAMIN D3) 125 MCG (5000 UT) CAPS Take 5,000 Units by mouth daily.     fluticasone (FLONASE) 50 MCG/ACT nasal spray USE 2 SPRAYS IN EACH NOSTRIL ONCE A DAY (Patient taking differently: Place 2 sprays into both nostrils daily as needed  for allergies.) 48 g 1   glucose blood test strip Use as instructed 2x a day - for One Touch Verio Reflect 200 each 3   metFORMIN (GLUCOPHAGE) 1000 MG tablet TAKE 1 TABLET BY MOUTH DAILY WITH SUPPER 90 tablet 1   metroNIDAZOLE (METROCREAM) 0.75 % cream Apply 1 application on the skin twice a day (Patient taking differently: Apply topically daily.) 45 g 11   omeprazole (PRILOSEC) 20 MG capsule Take 1 capsule (20 mg total) by mouth daily. 30 capsule 12   OneTouch Delica Lancets 16X MISC Use 2x a day - for One Touch Verio Reflect 200 each 3   pantoprazole (PROTONIX) 40 MG tablet Take 1 tablet (40 mg total) by mouth daily. 90 tablet 0   rOPINIRole (REQUIP) 0.5 MG tablet TAKE 1 TABLET BY MOUTH EVERY AFTERNOON AND 2 TABLETS AT BEDTIME (Patient taking differently: Take 1 mg by mouth daily.) 270 tablet 3   Semaglutide, 1 MG/DOSE, (OZEMPIC, 1 MG/DOSE,) 4 MG/3ML SOPN Inject 1 mg into the skin once a week. (Patient taking differently: Inject 1 mg into the skin every Sunday.) 9 mL 3   No current facility-administered medications on file prior to visit.   Allergies  Allergen Reactions   Sulfa Antibiotics Hives   Sulfonamide Derivatives Hives   Family History  Problem Relation Age of Onset   Skin cancer Mother    Hyperlipidemia Mother    Hypertension Father    Stroke Father    Heart disease Father    Diabetes Father    Pancreatic cancer Father 11   Prostate cancer Father        dx in his 25s;  prostatectomy   Sleep apnea Father    Obesity Father    Pancreatic cancer Sister 80       neuroendocrine tumor   Colon polyps Sister    Colon polyps Sister    Colon polyps Sister    Congestive Heart Failure Maternal Aunt    Dementia Maternal Uncle    Lung cancer Paternal Aunt    Heart attack Paternal Uncle    Pancreatic cancer Paternal Uncle 28   Testicular cancer Cousin        paternal first cousin dx in his 67s   Colon cancer Neg Hx    Esophageal cancer Neg Hx    Inflammatory bowel disease Neg Hx    Liver disease Neg Hx    Stomach cancer Neg Hx    Rectal cancer Neg Hx    PE: BP 128/76 (BP Location: Left Arm, Patient Position: Sitting, Cuff Size: Normal)   Pulse 76   Ht '4\' 11"'$  (1.499 m)   Wt 198 lb (89.8 kg)   LMP 02/08/2021 (Approximate)   SpO2 95%   BMI 39.99 kg/m  Wt Readings from Last 3 Encounters:  03/01/22 198 lb (89.8 kg)  01/09/22 218 lb (98.9 kg)  12/06/21 234 lb 4.8 oz (106.3 kg)   Constitutional: overweight, in NAD Eyes: PERRLA, EOMI, no exophthalmos ENT: moist mucous membranes, no thyromegaly, no cervical lymphadenopathy Cardiovascular: RRR, No MRG, + pitting LE edema B Respiratory: CTA B Musculoskeletal: no deformities Skin: moist, warm, no rashes Neurological: no tremor with outstretched hands Diabetic Foot Exam - Simple   Simple Foot Form Diabetic Foot exam was performed with the following findings: Yes 03/01/2022  4:35 PM  Visual Inspection No deformities, no ulcerations, no other skin breakdown bilaterally: Yes Sensation Testing Intact to touch and monofilament testing bilaterally: Yes Pulse Check Posterior Tibialis and Dorsalis pulse intact  bilaterally: Yes Comments B mild edema - pitting     ASSESSMENT: 1.  Type 2 diabetes, non-insulin-dependent, recent diagnosis, without complications  2.  Obesity class III  3. HL  PLAN:  1. Patient with history of prediabetes, but a new diagnosis of diabetes in 2021, when she had an HbA1c of 7%.   She previously had an HbA1c of 6.7% in 03/2019.  She continues on metformin and Ozempic.  However, since last visit, she had Roux-en-Y gastric bypass surgery and was able to lose 60 pounds.  She is eating a high-protein low-carb high-fiber diet, with small meals 3 times a day.   -As of now, her sugars are excellently controlled, usually lower than 100.  Therefore, I suggested to stop metformin.  To help with her weight loss, we can continue Ozempic for now. - I suggested to:  Patient Instructions  Please stop: - Metformin   Continue: - Ozempic 1 mg weekly   Please return in 6 months with your sugar log.   - we checked her HbA1c: 5.3% (lower) - advised to check sugars at different times of the day - 1x a day, rotating check times - advised for yearly eye exams >> she is UTD - foot exam performed today - return to clinic in 6 months  2.  Obesity class II -At this visit, we discussed about continuing Ozempic.  She would like to continue with this as it definitely helps with reducing appetite.  We discussed that if she loses more weight, we can start tapering the dose to off. -She lost 60 pounds since her gastric bypass surgery 3 months ago!  3. HL -Reviewed the latest lipid panel from 2022: LDL at goal, triglycerides slightly high, HDL slightly low Lab Results  Component Value Date   CHOL 150 06/06/2021   HDL 41 (L) 06/06/2021   LDLCALC 80 06/06/2021   TRIG 192 (H) 06/06/2021   CHOLHDL 3.7 06/06/2021  -Continues Lipitor 20 mg daily without side effects  Philemon Kingdom, MD PhD [facility]

## 2022-03-02 ENCOUNTER — Other Ambulatory Visit (HOSPITAL_COMMUNITY): Payer: Self-pay

## 2022-03-02 MED ORDER — ROPINIROLE HCL 0.5 MG PO TABS
ORAL_TABLET | ORAL | 1 refills | Status: DC
  Filled 2022-03-02: qty 270, 90d supply, fill #0
  Filled 2022-04-19 – 2022-06-11 (×2): qty 270, 90d supply, fill #1

## 2022-03-05 ENCOUNTER — Other Ambulatory Visit (HOSPITAL_COMMUNITY): Payer: Self-pay

## 2022-03-09 ENCOUNTER — Other Ambulatory Visit (HOSPITAL_COMMUNITY): Payer: Self-pay

## 2022-03-22 ENCOUNTER — Other Ambulatory Visit (HOSPITAL_COMMUNITY): Payer: Self-pay

## 2022-03-22 ENCOUNTER — Encounter: Payer: Self-pay | Admitting: Gastroenterology

## 2022-03-23 ENCOUNTER — Other Ambulatory Visit (HOSPITAL_COMMUNITY): Payer: Self-pay

## 2022-03-26 ENCOUNTER — Other Ambulatory Visit (HOSPITAL_COMMUNITY): Payer: Self-pay

## 2022-04-03 ENCOUNTER — Ambulatory Visit: Payer: 59

## 2022-04-03 DIAGNOSIS — G4733 Obstructive sleep apnea (adult) (pediatric): Secondary | ICD-10-CM | POA: Diagnosis not present

## 2022-04-16 ENCOUNTER — Encounter: Payer: Self-pay | Admitting: Skilled Nursing Facility1

## 2022-04-16 ENCOUNTER — Encounter: Payer: 59 | Attending: Internal Medicine | Admitting: Skilled Nursing Facility1

## 2022-04-16 DIAGNOSIS — E669 Obesity, unspecified: Secondary | ICD-10-CM | POA: Insufficient documentation

## 2022-04-16 NOTE — Progress Notes (Signed)
Bariatric Nutrition Follow-Up Visit Medical Nutrition Therapy    NUTRITION ASSESSMENT    Anthropometrics  Surgery date: 11/21/2021 Surgery type: RYGB Start weight at [facility]: 251.4 Weight today: 186.1 pounds   Body Composition Scale Date 01/09/2022 04/16/2022  Current Body Weight 234.3 218 186.1  Total Body Fat % 48 46.4 42.6  Visceral Fat '20 18 15  '$ Fat-Free Mass % 51.9 53.5 57.3   Total Body Water % 40.4 41.2 43.1  Muscle-Mass lbs 26.6 26.5 26  BMI 47.4 44.0 37.6  Body Fat Displacement            Torso  lbs 69.6 62.6 49         Left Leg  lbs 13.9 12.5 9.8         Right Leg  lbs 13.9 12.5 9.8         Left Arm  lbs 6.9 6.2 4.9         Right Arm   lbs 6.9 6.2 4.9   Clinical  Medical hx: DM, hyperlipemia, discoid lupus Medications: ozempic, metformin  Labs: A1C 5.3 Notable signs/symptoms: N/A Any previous deficiencies? Vitamin D, Low iron   Lifestyle & Dietary Hx   Pt is doing phenominally! Pt states she has a really good grasp on how to feed her body.  Pt states she will eat what she what's in appropriate amounts.  Pt states she recognizes non hunger feelings and keeps them in check.  Pt state seh is going to [place] and [employer].    Estimated daily fluid intake: 32+ oz Estimated daily protein intake: 40-60 g Supplements: multi, calcium Current average weekly physical activity: walking a mile or two four times a week, palates an hour once a week.  Pt states piliates is great: walking and kyaking  24-Hr Dietary Recall: 1-2 pieces of fruit daily First Meal: kodiak waffle + sometimes peanut butter or choffles (egg, meat, and cheese) Snack:  banana Second Meal: 3 meatballs + marainara + cheese Snack:   Third Meal: broccoli + pork chop + scallop potato Snack:  Beverages: water, gatorade zero, water flavorings  Post-Op Goals/ Signs/ Symptoms Using straws: no Drinking while eating: no Chewing/swallowing difficulties: no Changes in vision: no Changes to  mood/headaches: no Hair loss/changes to skin/nails: no Difficulty focusing/concentrating: no Sweating: no Limb weakness: no Dizziness/lightheadedness: no Palpitations: no  Carbonated/caffeinated beverages: no N/V/D/C/Gas: no Abdominal pain: no Dumping syndrome: no    NUTRITION DIAGNOSIS  Overweight/obesity ([place]-3.3) related to past poor dietary habits and physical inactivity as evidenced by completed bariatric surgery and following dietary guidelines for continued weight loss and healthy nutrition status.     NUTRITION INTERVENTION Nutrition counseling (C-1) and education (E-2) to facilitate bariatric surgery goals, including:  The importance of consuming adequate calories as well as certain nutrients daily due to the body's need for essential vitamins, minerals, and fats The importance of daily physical activity and to reach a goal of at least 150 minutes of moderate to vigorous physical activity weekly (or as directed by their physician) due to benefits such as increased musculature and improved lab values The importance of intuitive eating specifically learning hunger-satiety cues and understanding the importance of learning a new body: The importance of mindful eating to avoid grazing behaviors  Encouraged patient to honor their body's internal hunger and fullness cues.  Throughout the day, check in mentally and rate hunger. Stop eating when satisfied not full regardless of how much food is left on the plate.  Get more if still hungry  20-30 minutes later.  The key is to honor satisfaction so throughout the meal, rate fullness factor and stop when comfortably satisfied not physically full. The key is to honor hunger and fullness without any feelings of guilt or shame.  Pay attention to what the internal cues are, rather than any external factors. This will enhance the confidence you have in listening to your own body and following those internal cues enabling you to increase how often you  eat when you are hungry not out of appetite and stop when you are satisfied not full.  Encouraged pt to continue to eat balanced meals inclusive of non starchy vegetables 2 times a day 7 days a week Encouraged pt to choose lean protein sources: limiting beef, pork, sausage, hotdogs, and lunch meat Encourage pt to choose healthy fats such as plant based limiting animal fats Encouraged pt to continue to drink a minium 64 fluid ounces with half being plain water to satisfy proper hydration    Learning Style & Readiness for Change Teaching method utilized: Visual & Auditory  Demonstrated degree of understanding via: Teach Back  Readiness Level: action Barriers to learning/adherence to lifestyle change: none identified   RD's Notes for Next Visit Assess adherence to pt chosen goals    MONITORING & EVALUATION Dietary intake, weekly physical activity, body weight  Next Steps Patient is to follow-up in 3 months

## 2022-04-18 ENCOUNTER — Other Ambulatory Visit (HOSPITAL_COMMUNITY): Payer: Self-pay

## 2022-04-19 ENCOUNTER — Other Ambulatory Visit (HOSPITAL_COMMUNITY): Payer: Self-pay

## 2022-04-19 ENCOUNTER — Other Ambulatory Visit: Payer: Self-pay | Admitting: Internal Medicine

## 2022-04-19 MED ORDER — PANTOPRAZOLE SODIUM 40 MG PO TBEC
40.0000 mg | DELAYED_RELEASE_TABLET | Freq: Every day | ORAL | 0 refills | Status: DC
  Filled 2022-04-19: qty 90, 90d supply, fill #0

## 2022-04-19 MED ORDER — ATORVASTATIN CALCIUM 20 MG PO TABS
20.0000 mg | ORAL_TABLET | Freq: Every day | ORAL | 3 refills | Status: DC
  Filled 2022-04-19 – 2022-06-11 (×2): qty 90, 90d supply, fill #0
  Filled 2022-09-04 – 2022-09-13 (×3): qty 90, 90d supply, fill #1
  Filled 2022-12-06: qty 90, 90d supply, fill #2
  Filled 2023-03-07: qty 90, 90d supply, fill #3

## 2022-05-24 ENCOUNTER — Ambulatory Visit (INDEPENDENT_AMBULATORY_CARE_PROVIDER_SITE_OTHER): Payer: 59 | Admitting: Pulmonary Disease

## 2022-05-24 ENCOUNTER — Encounter: Payer: Self-pay | Admitting: Pulmonary Disease

## 2022-05-24 DIAGNOSIS — G2581 Restless legs syndrome: Secondary | ICD-10-CM

## 2022-05-24 DIAGNOSIS — G4733 Obstructive sleep apnea (adult) (pediatric): Secondary | ICD-10-CM | POA: Diagnosis not present

## 2022-05-24 NOTE — Assessment & Plan Note (Signed)
On Requip. Watch out for augmentation

## 2022-05-24 NOTE — Progress Notes (Signed)
   Subjective:    Patient ID: Connie Hall, female    DOB: 1965-09-11, 56 y.o.   MRN: [id number]  HPI 12 y o  for FU of severe OSA maintained on  cpap 13cm & RLS She works for [employer] health at the [employer]  3-year follow-up visit. She has dropped her CPAP from 13 to 9 cm.  She shows me data on her phone.  She continues to use nasal pillows.  She reports soreness around her nostrils and wonders if she should change the size of nasal pillows. She continues to feel rested, she has a travel ResMed mini which she uses.  She has an adapter for her own house and mask. She is very compliant with her machine  Regarding restless legs, Requip dose seems to have increased to 1 tablet in the afternoon and 2 at bedtime   Significant tests/ events reviewed NPSG 2011:  AHI 44/hr   Review of Systems neg for any significant sore throat, dysphagia, itching, sneezing, nasal congestion or excess/ purulent secretions, fever, chills, sweats, unintended wt loss, pleuritic or exertional cp, hempoptysis, orthopnea pnd or change in chronic leg swelling. Also denies presyncope, palpitations, heartburn, abdominal pain, nausea, vomiting, diarrhea or change in bowel or urinary habits, dysuria,hematuria, rash, arthralgias, visual complaints, headache, numbness weakness or ataxia.     Objective:   Physical Exam  Gen. Pleasant, obese, in no distress ENT - no lesions, no post nasal drip Neck: No JVD, no thyromegaly, no carotid bruits Lungs: no use of accessory muscles, no dullness to percussion, decreased without rales or rhonchi  Cardiovascular: Rhythm regular, heart sounds  normal, no murmurs or gallops, no peripheral edema Musculoskeletal: No deformities, no cyanosis or clubbing , no tremors       Assessment & Plan:

## 2022-05-24 NOTE — Assessment & Plan Note (Signed)
Reviewed data on her phone.  She is now set at 9 cm.  She is very compliant, residual AHI is very low.  Minimal leak CPAP is only helped improve her daytime somnolence and fatigue. CPAP supplies will be renewed for a year .  We discussed various mask options and she may do well with a nasal cradle mask that she can alternate with a smaller size nasal pillows. She would be eligible for replacement CPAP in November and we can send in a prescription for CPAP of 9 cm at that time  Weight loss encouraged, compliance with goal of at least 4-6 hrs every night is the expectation. Advised against medications with sedative side effects Cautioned against driving when sleepy - understanding that sleepiness will vary on a day to day basis

## 2022-05-24 NOTE — Patient Instructions (Signed)
  Call us in November and we can send a prescription for new CPAP set at 9 cm to [place].  X ResMed N30 nasal cradle mask

## 2022-05-29 ENCOUNTER — Encounter: Payer: Self-pay | Admitting: Internal Medicine

## 2022-05-29 DIAGNOSIS — E669 Obesity, unspecified: Secondary | ICD-10-CM | POA: Diagnosis not present

## 2022-05-29 DIAGNOSIS — Z9884 Bariatric surgery status: Secondary | ICD-10-CM | POA: Diagnosis not present

## 2022-05-29 DIAGNOSIS — E785 Hyperlipidemia, unspecified: Secondary | ICD-10-CM | POA: Diagnosis not present

## 2022-05-31 NOTE — Telephone Encounter (Signed)
Noted  

## 2022-06-01 DIAGNOSIS — Z0289 Encounter for other administrative examinations: Secondary | ICD-10-CM

## 2022-06-11 ENCOUNTER — Other Ambulatory Visit (HOSPITAL_COMMUNITY): Payer: Self-pay

## 2022-06-12 ENCOUNTER — Ambulatory Visit (INDEPENDENT_AMBULATORY_CARE_PROVIDER_SITE_OTHER): Payer: 59

## 2022-06-12 ENCOUNTER — Encounter: Payer: Self-pay | Admitting: Podiatry

## 2022-06-12 ENCOUNTER — Ambulatory Visit (INDEPENDENT_AMBULATORY_CARE_PROVIDER_SITE_OTHER): Payer: 59 | Admitting: Podiatry

## 2022-06-12 DIAGNOSIS — M778 Other enthesopathies, not elsewhere classified: Secondary | ICD-10-CM

## 2022-06-12 DIAGNOSIS — M2012 Hallux valgus (acquired), left foot: Secondary | ICD-10-CM | POA: Diagnosis not present

## 2022-06-12 MED ORDER — DEXAMETHASONE SODIUM PHOSPHATE 120 MG/30ML IJ SOLN
2.0000 mg | Freq: Once | INTRAMUSCULAR | Status: AC
  Administered 2022-06-12: 2 mg via INTRA_ARTICULAR

## 2022-06-12 NOTE — Progress Notes (Signed)
Subjective:  Patient ID: Connie Hall, female    DOB: 1966/03/19,  MRN: [id number] HPI Chief Complaint  Patient presents with   Foot Pain    1st MPJ left - bunion deformity x years, getting  more uncomfortable with shoes   New Patient (Initial Visit)    Est pt 2018    56 y.o. female presents with the above complaint.   ROS: Denies fever chills nausea vomiting muscle aches pains calf pain back pain chest pain shortness of breath.  Past Medical History:  Diagnosis Date   Back pain    Complication of anesthesia    low O2 sat   Deviated nasal septum    Discoid lupus    DM (diabetes mellitus) (HCC)    Family history of pancreatic cancer    Family history of prostate cancer    History of discoid lupus erythematosus    Hyperlipidemia    Leg edema    Obesity    OSA (obstructive sleep apnea)    Restless legs syndrome (RLS)    Vitamin D deficiency    Past Surgical History:  Procedure Laterality Date   BIOPSY  07/24/2021   Procedure: BIOPSY;  Surgeon: Irving Copas., MD;  Location: [facility];  Service: Gastroenterology;;   COLONOSCOPY WITH PROPOFOL N/A 07/24/2021   Procedure: COLONOSCOPY WITH PROPOFOL;  Surgeon: Irving Copas., MD;  Location: [facility];  Service: Gastroenterology;  Laterality: N/A;   ESOPHAGOGASTRODUODENOSCOPY (EGD) WITH PROPOFOL N/A 07/24/2021   Procedure: ESOPHAGOGASTRODUODENOSCOPY (EGD) WITH PROPOFOL;  Surgeon: Rush Landmark Telford Nab., MD;  Location: [facility];  Service: Gastroenterology;  Laterality: N/A;   POLYPECTOMY  07/24/2021   Procedure: POLYPECTOMY;  Surgeon: Rush Landmark Telford Nab., MD;  Location: [facility];  Service: Gastroenterology;;   ROTATOR CUFF REPAIR  2009   left   Sebaceous cyst removal, back  03/27/2008   XI ROBOTIC ASSISTED HIATAL HERNIA REPAIR N/A 11/21/2021   Procedure: XI ROBOTIC [place];  Surgeon: Felicie Morn, MD;  Location: [facility];  Service: General;  Laterality: N/A;     Current Outpatient Medications:    pantoprazole (PROTONIX) 40 MG tablet, Take 1 tablet by mouth daily., Disp: , Rfl:    ALLEGRA-D ALLERGY & CONGESTION 60-120 MG 12 hr tablet, TAKE 1 TABLET BY MOUTH TWICE A DAY (Patient taking differently: Take 1 tablet by mouth daily as needed (allergies).), Disp: 180 tablet, Rfl: 2   atorvastatin (LIPITOR) 20 MG tablet, Take 1 tablet (20 mg total) by mouth daily., Disp: 90 tablet, Rfl: 3   Cholecalciferol (VITAMIN D3) 125 MCG (5000 UT) CAPS, Take 5,000 Units by mouth daily., Disp: , Rfl:    fluticasone (FLONASE) 50 MCG/ACT nasal spray, USE 2 SPRAYS IN EACH NOSTRIL ONCE A DAY (Patient taking differently: Place 2 sprays into both nostrils daily as needed for allergies.), Disp: 48 g, Rfl: 1   glucose blood test strip, Use as instructed 2x a day - for One Touch Verio Reflect, Disp: 200 each, Rfl: 3   metroNIDAZOLE (METROCREAM) 0.75 % cream, Apply 1 application on the skin twice a day (Patient taking differently: Apply topically daily.), Disp: 45 g, Rfl: 11   OneTouch Delica Lancets 29U MISC, Use 2x a day - for One Touch Verio Reflect, Disp: 200 each, Rfl: 3   rOPINIRole (REQUIP) 0.5 MG tablet, TAKE 1 TABLET BY MOUTH EVERY AFTERNOON AND 2 TABLETS AT BEDTIME, Disp: 270 tablet, Rfl: 1   Semaglutide, 1 MG/DOSE, (OZEMPIC, 1 MG/DOSE,) 4 MG/3ML SOPN, Inject 1 mg into the  skin once a week., Disp: 9 mL, Rfl: 3  Allergies  Allergen Reactions   Sulfa Antibiotics Hives   Sulfonamide Derivatives Hives   Review of Systems Objective:  There were no vitals filed for this visit.  General: Well developed, nourished, in no acute distress, alert and oriented x3   Dermatological: Skin is warm, dry and supple bilateral. Nails x 10 are well maintained; remaining integument appears unremarkable at this time. There are no open sores, no preulcerative lesions, no rash or signs of infection present.  Vascular: Dorsalis Pedis artery and Posterior Tibial artery pedal pulses are 2/4  bilateral with immedate capillary fill time. Pedal hair growth present. No varicosities and no lower extremity edema present bilateral.   Neruologic: Grossly intact via light touch bilateral. Vibratory intact via tuning fork bilateral. Protective threshold with Semmes Wienstein monofilament intact to all pedal sites bilateral. Patellar and Achilles deep tendon reflexes 2+ bilateral. No Babinski or clonus noted bilateral.   Musculoskeletal: No gross boney pedal deformities bilateral. No pain, crepitus, or limitation noted with foot and ankle range of motion bilateral. Muscular strength 5/5 in all groups tested bilateral.  Pain on palpation of the first metatarsal phalangeal joint which is reducible.  She has pain on maximal excursion dorsiflexion and plantarflexion.  Gait: Unassisted, Nonantalgic.    Radiographs:  Radiographs taken today demonstrate a increase in the first metatarsal angle of the right foot with osteoarthritic changes at the first metatarsal phalangeal joint left foot.  No spurring is noted.  No acute findings noted.  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  Plan: Injected the area today with dexamethasone local anesthetic discussed the need for surgical intervention she will follow-up with me on an as-needed basis.     Margarie Mcguirt T. [place], [place]

## 2022-06-13 ENCOUNTER — Other Ambulatory Visit (HOSPITAL_COMMUNITY): Payer: Self-pay

## 2022-06-14 ENCOUNTER — Other Ambulatory Visit: Payer: 59

## 2022-06-14 DIAGNOSIS — E119 Type 2 diabetes mellitus without complications: Secondary | ICD-10-CM | POA: Diagnosis not present

## 2022-06-14 DIAGNOSIS — R7302 Impaired glucose tolerance (oral): Secondary | ICD-10-CM | POA: Diagnosis not present

## 2022-06-14 DIAGNOSIS — R5383 Other fatigue: Secondary | ICD-10-CM

## 2022-06-14 DIAGNOSIS — Z9889 Other specified postprocedural states: Secondary | ICD-10-CM

## 2022-06-14 DIAGNOSIS — E781 Pure hyperglyceridemia: Secondary | ICD-10-CM

## 2022-06-14 DIAGNOSIS — E786 Lipoprotein deficiency: Secondary | ICD-10-CM | POA: Diagnosis not present

## 2022-06-14 DIAGNOSIS — K911 Postgastric surgery syndromes: Secondary | ICD-10-CM | POA: Diagnosis not present

## 2022-06-18 NOTE — Addendum Note (Signed)
Addended by: Geradine Girt D on: 06/18/2022 03:01 PM   Modules accepted: Orders

## 2022-06-19 ENCOUNTER — Ambulatory Visit (INDEPENDENT_AMBULATORY_CARE_PROVIDER_SITE_OTHER): Payer: 59 | Admitting: Internal Medicine

## 2022-06-19 ENCOUNTER — Encounter: Payer: Self-pay | Admitting: Internal Medicine

## 2022-06-19 VITALS — BP 120/76 | HR 67 | Temp 97.8°F | Ht 59.0 in | Wt 179.8 lb

## 2022-06-19 DIAGNOSIS — R5383 Other fatigue: Secondary | ICD-10-CM | POA: Diagnosis not present

## 2022-06-19 DIAGNOSIS — R7302 Impaired glucose tolerance (oral): Secondary | ICD-10-CM

## 2022-06-19 DIAGNOSIS — Z6836 Body mass index (BMI) 36.0-36.9, adult: Secondary | ICD-10-CM

## 2022-06-19 DIAGNOSIS — Z8639 Personal history of other endocrine, nutritional and metabolic disease: Secondary | ICD-10-CM

## 2022-06-19 DIAGNOSIS — Z0000 Encounter for general adult medical examination without abnormal findings: Secondary | ICD-10-CM

## 2022-06-19 DIAGNOSIS — E785 Hyperlipidemia, unspecified: Secondary | ICD-10-CM | POA: Diagnosis not present

## 2022-06-19 DIAGNOSIS — Z136 Encounter for screening for cardiovascular disorders: Secondary | ICD-10-CM | POA: Diagnosis not present

## 2022-06-19 DIAGNOSIS — E119 Type 2 diabetes mellitus without complications: Secondary | ICD-10-CM

## 2022-06-19 DIAGNOSIS — Z9889 Other specified postprocedural states: Secondary | ICD-10-CM | POA: Diagnosis not present

## 2022-06-19 LAB — COMPLETE METABOLIC PANEL WITH GFR
AG Ratio: 1.6 (calc) (ref 1.0–2.5)
ALT: 20 U/L (ref 6–29)
AST: 16 U/L (ref 10–35)
Albumin: 4.3 g/dL (ref 3.6–5.1)
Alkaline phosphatase (APISO): 55 U/L (ref 37–153)
BUN: 19 mg/dL (ref 7–25)
CO2: 30 mmol/L (ref 20–32)
Calcium: 9.7 mg/dL (ref 8.6–10.4)
Chloride: 104 mmol/L (ref 98–110)
Creat: 0.71 mg/dL (ref 0.50–1.03)
Globulin: 2.7 g/dL (calc) (ref 1.9–3.7)
Glucose, Bld: 88 mg/dL (ref 65–99)
Potassium: 4.5 mmol/L (ref 3.5–5.3)
Sodium: 142 mmol/L (ref 135–146)
Total Bilirubin: 0.4 mg/dL (ref 0.2–1.2)
Total Protein: 7 g/dL (ref 6.1–8.1)
eGFR: 101 mL/min/{1.73_m2} (ref >=60)

## 2022-06-19 LAB — POCT URINALYSIS DIPSTICK
Bilirubin, UA: NEGATIVE
Blood, UA: NEGATIVE
Glucose, UA: NEGATIVE
Ketones, UA: NEGATIVE
Leukocytes, UA: NEGATIVE
Nitrite, UA: NEGATIVE
Protein, UA: NEGATIVE
Spec Grav, UA: 1.02 (ref 1.010–1.025)
Urobilinogen, UA: 0.2 E.U./dL
pH, UA: 5 (ref 5.0–8.0)

## 2022-06-19 LAB — CBC WITH DIFFERENTIAL/PLATELET
Absolute Monocytes: 310 cells/uL (ref 200–950)
Basophils Absolute: 37 cells/uL (ref 0–200)
Basophils Relative: 0.6 %
Eosinophils Absolute: 81 cells/uL (ref 15–500)
Eosinophils Relative: 1.3 %
HCT: 37.1 % (ref 35.0–45.0)
Hemoglobin: 11.8 g/dL (ref 11.7–15.5)
Lymphs Abs: 2201 cells/uL (ref 850–3900)
MCH: 28.4 pg (ref 27.0–33.0)
MCHC: 31.8 g/dL — ABNORMAL LOW (ref 32.0–36.0)
MCV: 89.4 fL (ref 80.0–100.0)
MPV: 10.1 fL (ref 7.5–12.5)
Monocytes Relative: 5 %
Neutro Abs: 3571 cells/uL (ref 1500–7800)
Neutrophils Relative %: 57.6 %
Platelets: 290 10*3/uL (ref 140–400)
RBC: 4.15 10*6/uL (ref 3.80–5.10)
RDW: 13.2 % (ref 11.0–15.0)
Total Lymphocyte: 35.5 %
WBC: 6.2 10*3/uL (ref 3.8–10.8)

## 2022-06-19 LAB — LIPID PANEL
Cholesterol: 125 mg/dL (ref <200)
HDL: 41 mg/dL — ABNORMAL LOW (ref >=50)
LDL Cholesterol (Calc): 63 mg/dL (calc)
Non-HDL Cholesterol (Calc): 84 mg/dL (calc) (ref <130)
Total CHOL/HDL Ratio: 3 (calc) (ref <5.0)
Triglycerides: 124 mg/dL (ref <150)

## 2022-06-19 LAB — IRON,TIBC AND FERRITIN PANEL
%SAT: 19 % (calc) (ref 16–45)
Ferritin: 131 ng/mL (ref 16–232)
Iron: 63 ug/dL (ref 45–160)
TIBC: 329 mcg/dL (calc) (ref 250–450)

## 2022-06-19 LAB — MICROALBUMIN, URINE: Microalb, Ur: 0.6 mg/dL

## 2022-06-19 LAB — TSH: TSH: 2.71 mIU/L

## 2022-06-19 LAB — HEMOGLOBIN A1C
Hgb A1c MFr Bld: 5.7 % of total Hgb — ABNORMAL HIGH (ref <5.7)
Mean Plasma Glucose: 117 mg/dL
eAG (mmol/L): 6.5 mmol/L

## 2022-06-19 LAB — B12 AND FOLATE PANEL
Folate: >24 ng/mL
Vitamin B-12: >2000 pg/mL — ABNORMAL HIGH (ref 200–1100)

## 2022-06-19 LAB — VITAMIN D 25 HYDROXY (VIT D DEFICIENCY, FRACTURES): Vit D, 25-Hydroxy: 69 ng/mL (ref 30–100)

## 2022-06-19 NOTE — Progress Notes (Unsigned)
Subjective:    Patient ID: Connie Hall, female    DOB: [birth year], 56 y.o.   MRN: [id number]  HPI 56 year old Female seen for health maintenance exam and evaluation of medical issues. She looks and feels well.  She had robotic Roux-en-Y gastric bypass surgery with hiatal hernia repair in March 2023.  Had liver injury during surgery and JP drain was placed which was removed December 13, 2021.  Preoperative weight was 243 pounds.  Recently weighed at surgery office and weight 184.4 pounds.  Currently followed there by PA.  Had colonoscopy and endoscopy by Dr. Rush Landmark in November 2022.  Upper endoscopy was for evaluation prior to bariatric surgery and colonoscopy was for colon cancer screening with history of colon cancer in family.  6 polyps were removed on colonoscopy.  Pathology showed tubular adenoma, 2 fragments.  1 hyperplastic polyp.  On endoscopy had acutely inflamed squamocolumnar mucosa with focal ulceration without intestinal metaplasia, dysplasia or malignancy.  H. pylori stain was negative.  Social history: Single, never married.  Seldom consumes alcohol.  Does not smoke.  Has a college degree.  Has family in [place].  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  She also serves on several boards.  She likes her job.  Travel to [place] recently.  Family history: Father with history of hypertension, stroke, coronary artery disease, kidney stones and diabetes.  Mother with history of osteoporosis.  No brothers.  4 sisters, 2 of whom have diabetes.  2 sisters have sleep apnea.  3 sisters are overweight.  1 sister is overall in good health.  She saw Dr. Benay Spice in 2018 to discuss genetics with family history of pancreatic cancer.  Genetic screening was negative.  Father and paternal uncle died of pancreatic cancer.  Sister was diagnosed with a neuroendocrine tumor at the tail of the pancreas in 2017.  Patient is allergic to Sulfa-it causes  hives  She obtains flu vaccine through employment.  Other vaccines discussed.  Suggest COVID booster, pneumococcal 20.  She has had Shingrix in 2020.  Tetanus immunization is up-to-date.  She had mammogram in January 2023.  Seen Dr. Elsworth Soho for sleep apnea in September 2023.  CPAP is set at 9 cm.  Home sleep service is by [facility].  Review of Systems did well traveling.  No new complaints.  Feels well in general.  Exercising.     Objective:   Physical Exam Blood pressure 120/76, pulse 67, temperature 97.8 degrees ,pulse oximetry 100% ,weight 179 pounds 12.8 ounces ,height 4 feet 11 inches.  BMI 36.32 (previous BMI October 2023 was 48.90) Skin: Warm and dry.  Nodes none.  TMs clear.  Pharynx is clear.  No carotid bruits.  Chest clear to auscultation.  Cardiac exam: Regular rate and rhythm without ectopy.  Abdomen soft nondistended without hepatosplenomegaly masses or tenderness.  GYN deferred.  No lower extremity pitting edema.  Affect thought and judgment are normal.  No focal deficits on brief neurological exam.  Labs: Reviewed and are within normal limits including lipids and TSH.  In July her hemoglobin A1c normalized to 5.3% and had been 7.1% in February 2022      Assessment & Plan:  Successful gastric bypass surgery with BMI nail 36.32 and previously was 48.90.  In October 2022, she weighed 238 pounds and now weighs 179 pounds 12.8 ounces.  Therefore is lost about 58 pounds.  She feels well and looks well.   History of  hyperlipidemia-continue Lipitor 20 mg daily.  Lipids are normal.  History of GE reflux and gastric bypass surgery currently on generic Protonix 40 mg daily  History of restless leg syndrome treated with Requip  History of impaired glucose tolerance and Dr. Maryellen Pile has her on Ozempic 1 mg once a week.  Family history of pancreatic cancer-had genetic testing which was negative by Dr. Benay Spice  Plan: We will see patient again in 6 months.  Continue current medications.   Vaccines discussed.  Suggest coronary calcium scoring with family history of heart disease.

## 2022-06-20 LAB — MICROALBUMIN, URINE: Microalb, Ur: 0.7 mg/dL

## 2022-06-20 NOTE — Patient Instructions (Signed)
Please contact central scheduling to schedule your coronary calcium score at [facility] with family history of heart disease.  The number is [phone].  Continue current medications.  We are pleased you are doing well and feel well.  Follow-up in 6 months.

## 2022-07-02 ENCOUNTER — Ambulatory Visit (HOSPITAL_COMMUNITY)
Admission: RE | Admit: 2022-07-02 | Discharge: 2022-07-02 | Disposition: A | Discharge status: Home / Self Care | Payer: 59 | Source: Ambulatory Visit | Attending: Internal Medicine | Admitting: Internal Medicine

## 2022-07-02 DIAGNOSIS — G4733 Obstructive sleep apnea (adult) (pediatric): Secondary | ICD-10-CM | POA: Diagnosis not present

## 2022-07-02 DIAGNOSIS — Z136 Encounter for screening for cardiovascular disorders: Secondary | ICD-10-CM | POA: Insufficient documentation

## 2022-07-03 ENCOUNTER — Telehealth: Payer: Self-pay | Admitting: Internal Medicine

## 2022-07-03 NOTE — Telephone Encounter (Signed)
Referral placed.

## 2022-07-03 NOTE — Telephone Encounter (Signed)
Spoke with patient and she was informed of message and would like to proceed with Cardiology referral

## 2022-07-03 NOTE — Telephone Encounter (Signed)
Connie Hall [id number]  Metha called to say she had seen her results in [place] and she would like to talk about the results, I let her know once you had a chance to go over results we would call and set up an appointment to discuss.

## 2022-07-11 ENCOUNTER — Other Ambulatory Visit: Payer: Self-pay | Admitting: Internal Medicine

## 2022-07-11 ENCOUNTER — Other Ambulatory Visit (HOSPITAL_COMMUNITY): Payer: Self-pay

## 2022-07-11 DIAGNOSIS — J00 Acute nasopharyngitis [common cold]: Secondary | ICD-10-CM

## 2022-07-11 MED ORDER — FLUTICASONE PROPIONATE 50 MCG/ACT NA SUSP
2.0000 | Freq: Every day | NASAL | 99 refills | Status: DC
  Filled 2022-07-11: qty 16, 30d supply, fill #0
  Filled 2022-08-08: qty 48, 90d supply, fill #1
  Filled 2022-09-04 – 2022-11-13 (×2): qty 48, 90d supply, fill #2
  Filled 2023-02-06: qty 48, 90d supply, fill #3
  Filled 2023-05-06: qty 48, 90d supply, fill #4
  Filled 2023-07-11: qty 48, 90d supply, fill #5

## 2022-07-12 ENCOUNTER — Other Ambulatory Visit (HOSPITAL_COMMUNITY): Payer: Self-pay

## 2022-07-16 ENCOUNTER — Encounter (HOSPITAL_BASED_OUTPATIENT_CLINIC_OR_DEPARTMENT_OTHER): Payer: Self-pay | Admitting: Pulmonary Disease

## 2022-07-17 ENCOUNTER — Encounter: Payer: 59 | Admitting: Skilled Nursing Facility1

## 2022-07-24 ENCOUNTER — Ambulatory Visit: Payer: 59 | Admitting: Skilled Nursing Facility1

## 2022-07-24 NOTE — Progress Notes (Signed)
CARDIOLOGY CONSULT NOTE       Patient ID: Connie Hall MRN: [id number] DOB/AGE: Feb 21, 1966 56 y.o.  Admit date: (Not on file) Referring Physician: [place] Primary Physician: Elby Showers, MD Primary Cardiologist: New Reason for Consultation: CAD  Active Problems:   * No active hospital problems. *   HPI:  56 y.o. referred by Dr Renold Genta for sub clinical CAD. Patient has a screening calcium score 07/03/22 Total score was 304, 99 th percentile involving the RCA and LAD arteries She is diabetic and on lipitor for HLD Most recent A1c is 5.7 and LDL 63 on lipitor 20 mg daily She had a normal TTE done for edema on 11/29/17 with EF 60-65% and no significant valve dx.  She is followed at [facility] after bariatric surgery with Roux-en-y gastric bypass with hernia repair. Complicated by liver injury Weight down from 243 to 184.    She has no chest pain She is more active since losing weight. She works at [employer] union for 18 years and lives across from my mom at [employer] [address]. Originally from [place] other systems reviewed and negative except as noted above  Past Medical History:  Diagnosis Date   Back pain    Complication of anesthesia    low O2 sat   Deviated nasal septum    Discoid lupus    DM (diabetes mellitus) (HCC)    Family history of pancreatic cancer    Family history of prostate cancer    History of discoid lupus erythematosus    Hyperlipidemia    Leg edema    Obesity    OSA (obstructive sleep apnea)    Restless legs syndrome (RLS)    Vitamin D deficiency     Family History  Problem Relation Age of Onset   Skin cancer Mother    Hyperlipidemia Mother    Hypertension Father    Stroke Father    Heart disease Father    Diabetes Father    Pancreatic cancer Father 78   Prostate cancer Father        dx in his 38s; prostatectomy   Sleep apnea Father    Obesity Father    Pancreatic cancer Sister 65       neuroendocrine tumor   Colon polyps Sister     Colon polyps Sister    Colon polyps Sister    Congestive Heart Failure Maternal Aunt    Dementia Maternal Uncle    Lung cancer Paternal Aunt    Heart attack Paternal Uncle    Pancreatic cancer Paternal Uncle 39   Testicular cancer Cousin        paternal first cousin dx in his 58s   Colon cancer Neg Hx    Esophageal cancer Neg Hx    Inflammatory bowel disease Neg Hx    Liver disease Neg Hx    Stomach cancer Neg Hx    Rectal cancer Neg Hx     Social History   Socioeconomic History   Marital status: Single    Spouse name: Not on file   Number of children: 0   Years of education: Not on file   Highest education level: Not on file  Occupational History   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    Employer: [employer]  Tobacco Use   Smoking status: Former    Packs/day: 1.00    Years: 15.00    Total pack years: 15.00    Types: Cigarettes    Quit date: 08/27/1998  Years since quitting: 23.9   Smokeless tobacco: Never  Vaping Use   Vaping Use: Never used  Substance and Sexual Activity   Alcohol use: Yes    Comment: rare   Drug use: No   Sexual activity: Not on file  Other Topics Concern   Not on file  Social History Narrative   Not on file   Social Determinants of Health   Financial Resource Strain: Not on file  Food Insecurity: Not on file  Transportation Needs: Not on file  Physical Activity: Not on file  Stress: Not on file  Social Connections: Not on file  Intimate Partner Violence: Not on file    Past Surgical History:  Procedure Laterality Date   BIOPSY  07/24/2021   Procedure: BIOPSY;  Surgeon: Irving Copas., MD;  Location: [facility];  Service: Gastroenterology;;   COLONOSCOPY WITH PROPOFOL N/A 07/24/2021   Procedure: COLONOSCOPY WITH PROPOFOL;  Surgeon: Irving Copas., MD;  Location: [facility];  Service: Gastroenterology;  Laterality: N/A;   ESOPHAGOGASTRODUODENOSCOPY (EGD) WITH PROPOFOL N/A 07/24/2021   Procedure:  ESOPHAGOGASTRODUODENOSCOPY (EGD) WITH PROPOFOL;  Surgeon: Rush Landmark Telford Nab., MD;  Location: [facility];  Service: Gastroenterology;  Laterality: N/A;   POLYPECTOMY  07/24/2021   Procedure: POLYPECTOMY;  Surgeon: Rush Landmark Telford Nab., MD;  Location: [facility];  Service: Gastroenterology;;   ROTATOR CUFF REPAIR  2009   left   Sebaceous cyst removal, back  03/27/2008   XI ROBOTIC ASSISTED HIATAL HERNIA REPAIR N/A 11/21/2021   Procedure: XI ROBOTIC ASSISTED HIATAL HERNIA REPAIR;  Surgeon: Felicie Morn, MD;  Location: [facility];  Service: General;  Laterality: N/A;      Current Outpatient Medications:    ALLEGRA-D ALLERGY & CONGESTION 60-120 MG 12 hr tablet, TAKE 1 TABLET BY MOUTH TWICE A DAY, Disp: 180 tablet, Rfl: 2   atorvastatin (LIPITOR) 20 MG tablet, Take 1 tablet (20 mg total) by mouth daily., Disp: 90 tablet, Rfl: 3   Calcium Carb-Cholecalciferol (CALCIUM 600+D) 600-10 MG-MCG TABS, Take by mouth 4 (four) times daily -  before meals and at bedtime., Disp: , Rfl:    Cholecalciferol (VITAMIN D3) 125 MCG (5000 UT) CAPS, Take 5,000 Units by mouth daily., Disp: , Rfl:    fluticasone (FLONASE) 50 MCG/ACT nasal spray, Place 2 sprays into both nostrils daily., Disp: 16 g, Rfl: PRN   glucose blood test strip, Use as instructed 2x a day - for One Touch Verio Reflect, Disp: 200 each, Rfl: 3   metroNIDAZOLE (METROCREAM) 0.75 % cream, Apply 1 application on the skin twice a day, Disp: 45 g, Rfl: 11   Multiple Vitamin (MULTIVITAMIN) tablet, Take 1 tablet by mouth daily., Disp: , Rfl:    OneTouch Delica Lancets 50P MISC, Use 2x a day - for One Touch Verio Reflect, Disp: 200 each, Rfl: 3   pantoprazole (PROTONIX) 40 MG tablet, Take 1 tablet by mouth daily., Disp: , Rfl:    rOPINIRole (REQUIP) 0.5 MG tablet, TAKE 1 TABLET BY MOUTH EVERY AFTERNOON AND 2 TABLETS AT BEDTIME, Disp: 270 tablet, Rfl: 1   Semaglutide, 1 MG/DOSE, (OZEMPIC, 1 MG/DOSE,) 4 MG/3ML SOPN, Inject 1 mg into the skin once a  week., Disp: 9 mL, Rfl: 3    Physical Exam: Blood pressure 120/80, pulse 79, height '4\' 11"'$  (1.499 m), weight 179 lb 12.8 oz (81.6 kg), last menstrual period 02/08/2021, SpO2 97 %.   Affect appropriate Healthy:  appears stated age 19: normal Neck supple with no adenopathy JVP normal no bruits  no thyromegaly Lungs clear with no wheezing and good diaphragmatic motion Heart:  S1/S2 no murmur, no rub, gallop or click PMI normal Abdomen: benighn, BS positve, no tenderness, no AAA no bruit.  No HSM or HJR Distal pulses intact with no bruits No edema Neuro non-focal Skin warm and dry No muscular weakness   Labs:   Lab Results  Component Value Date   WBC 6.2 06/14/2022   HGB 11.8 06/14/2022   HCT 37.1 06/14/2022   MCV 89.4 06/14/2022   PLT 290 06/14/2022   No results for input(s): "NA", "K", "CL", "CO2", "BUN", "CREATININE", "CALCIUM", "PROT", "BILITOT", "ALKPHOS", "ALT", "AST", "GLUCOSE" in the last 168 hours.  Invalid input(s): "LABALBU" No results found for: "CKTOTAL", "CKMB", "CKMBINDEX", "TROPONINI"  Lab Results  Component Value Date   CHOL 125 06/14/2022   CHOL 150 06/06/2021   CHOL 163 05/13/2020   Lab Results  Component Value Date   HDL 41 (L) 06/14/2022   HDL 41 (L) 06/06/2021   HDL 37 (L) 05/13/2020   Lab Results  Component Value Date   LDLCALC 63 06/14/2022   LDLCALC 80 06/06/2021   LDLCALC 99 05/13/2020   Lab Results  Component Value Date   TRIG 124 06/14/2022   TRIG 192 (H) 06/06/2021   TRIG 174 (H) 05/13/2020   Lab Results  Component Value Date   CHOLHDL 3.0 06/14/2022   CHOLHDL 3.7 06/06/2021   CHOLHDL 4.4 05/13/2020   No results found for: "LDLDIRECT"    Radiology: No results found.  EKG: 06/05/21 NSR rate 92 normal 08/07/2022 SR rate 79 normal    ASSESSMENT AND PLAN:   CAD:  sub clinical involving LAD/RCA on coronary calcium score LDL at goal on statin Normal ETT Will order ETT since she has normal ECG DM:  Discussed low carb  diet.  Target hemoglobin A1c is 6.5 or less.  Continue current medications. HLD:  continue statin  Obesity: post bariatric surgery with good weight loss LFT;s normal on labs 06/14/22    ETT  F/U in a year  Signed: Jenkins Rouge 08/07/2022, 3:28 PM

## 2022-07-26 ENCOUNTER — Telehealth: Payer: Self-pay | Admitting: Pulmonary Disease

## 2022-07-26 DIAGNOSIS — G4733 Obstructive sleep apnea (adult) (pediatric): Secondary | ICD-10-CM

## 2022-07-26 NOTE — Telephone Encounter (Signed)
Called [facility] and verified she received her current machine 06/2017.  Called and spoke with patient and advised that she is eligible for a new machine since she last received one in 2018.  She would like to get the most up to date resmed machine (Airsense 11) machine.  She would also like a script for CPAP supplies uploaded so she can order her supplies online.  CPAP machine and CPAP supplies ordered.  Nothing further needed.

## 2022-07-26 NOTE — Telephone Encounter (Signed)
Patient called to request an order for her cpap.  She stated she had requested it before but has not heard anything.  Please advise and call patient to confirm at [phone]

## 2022-08-07 ENCOUNTER — Ambulatory Visit: Payer: 59 | Attending: Cardiovascular Disease | Admitting: Cardiovascular Disease

## 2022-08-07 ENCOUNTER — Encounter: Payer: Self-pay | Admitting: Cardiovascular Disease

## 2022-08-07 VITALS — BP 120/80 | HR 79 | Ht 59.0 in | Wt 179.8 lb

## 2022-08-07 DIAGNOSIS — E782 Mixed hyperlipidemia: Secondary | ICD-10-CM

## 2022-08-07 DIAGNOSIS — I2510 Atherosclerotic heart disease of native coronary artery without angina pectoris: Secondary | ICD-10-CM

## 2022-08-07 DIAGNOSIS — Z9884 Bariatric surgery status: Secondary | ICD-10-CM | POA: Diagnosis not present

## 2022-08-07 NOTE — Patient Instructions (Signed)
Medication Instructions:  Your physician recommends that you continue on your current medications as directed. Please refer to the Current Medication list given to you today.  *If you need a refill on your cardiac medications before your next appointment, please call your pharmacy*   Lab Work: If you have labs (blood work) drawn today and your tests are completely normal, you will receive your results only by: [place] (if you have MyChart) OR A paper copy in the mail If you have any lab test that is abnormal or we need to change your treatment, we will call you to review the results.   Testing/Procedures: Your physician has requested that you have an exercise tolerance test. For further information please visit [email or web address]. Please also follow instruction sheet, as given.  Follow-Up: At [facility], you and your health needs are our priority.  As part of our continuing mission to provide you with exceptional heart care, we have created designated Provider Care Teams.  These Care Teams include your primary Cardiologist (physician) and Advanced Practice Providers (APPs -  Physician Assistants and Nurse Practitioners) who all work together to provide you with the care you need, when you need it.  We recommend signing up for the patient portal called "MyChart".  Sign up information is provided on this After Visit Summary.  MyChart is used to connect with patients for Virtual Visits (Telemedicine).  Patients are able to view lab/test results, encounter notes, upcoming appointments, etc.  Non-urgent messages can be sent to your provider as well.   To learn more about what you can do with MyChart, go to [email or web address].    Your next appointment:   1 year(s)  The format for your next appointment:   In Person  Provider:   Jenkins Rouge, MD      Important Information About Sugar

## 2022-08-08 ENCOUNTER — Other Ambulatory Visit (HOSPITAL_COMMUNITY): Payer: Self-pay

## 2022-08-08 ENCOUNTER — Other Ambulatory Visit: Payer: Self-pay

## 2022-08-15 DIAGNOSIS — G4733 Obstructive sleep apnea (adult) (pediatric): Secondary | ICD-10-CM | POA: Diagnosis not present

## 2022-08-22 ENCOUNTER — Ambulatory Visit: Payer: 59 | Admitting: Skilled Nursing Facility1

## 2022-09-04 ENCOUNTER — Other Ambulatory Visit: Payer: Self-pay | Admitting: Internal Medicine

## 2022-09-04 ENCOUNTER — Ambulatory Visit: Payer: 59 | Attending: Cardiovascular Disease

## 2022-09-04 ENCOUNTER — Other Ambulatory Visit (HOSPITAL_COMMUNITY): Payer: Self-pay

## 2022-09-04 DIAGNOSIS — I2510 Atherosclerotic heart disease of native coronary artery without angina pectoris: Secondary | ICD-10-CM | POA: Diagnosis not present

## 2022-09-04 LAB — EXERCISE TOLERANCE TEST
Angina Index: 0
Base ST Depression (mm): 0 mm
Duke Treadmill Score: 8
Estimated workload: 10
Exercise duration (min): 8 min
Exercise duration (sec): 0 s
MPHR: 166 {beats}/min
Peak HR: 160 {beats}/min
Percent HR: 96 %
RPE: 15
Rest HR: 65 {beats}/min
ST Depression (mm): 0 mm

## 2022-09-04 MED ORDER — ROPINIROLE HCL 0.5 MG PO TABS
ORAL_TABLET | ORAL | 1 refills | Status: DC
  Filled 2022-09-04 – 2022-09-13 (×3): qty 270, 90d supply, fill #0
  Filled 2022-12-06: qty 270, 90d supply, fill #1

## 2022-09-05 ENCOUNTER — Encounter: Payer: 59 | Attending: Surgery | Admitting: Skilled Nursing Facility1

## 2022-09-05 ENCOUNTER — Other Ambulatory Visit (HOSPITAL_COMMUNITY): Payer: Self-pay

## 2022-09-05 ENCOUNTER — Encounter: Payer: Self-pay | Admitting: Skilled Nursing Facility1

## 2022-09-05 ENCOUNTER — Other Ambulatory Visit: Payer: Self-pay

## 2022-09-05 ENCOUNTER — Encounter (HOSPITAL_COMMUNITY): Payer: Self-pay

## 2022-09-05 VITALS — Ht 59.0 in | Wt 174.3 lb

## 2022-09-05 DIAGNOSIS — E669 Obesity, unspecified: Secondary | ICD-10-CM | POA: Diagnosis not present

## 2022-09-05 NOTE — Progress Notes (Signed)
Bariatric Nutrition Follow-Up Visit Medical Nutrition Therapy    NUTRITION ASSESSMENT    Anthropometrics  Surgery date: 11/21/2021 Surgery type: RYGB Start weight at [facility]: 251.4 Weight today: 174.3 pounds   Body Composition Scale Date 01/09/2022 04/16/2022 09/05/2022  Current Body Weight 234.3 218 186.1 174.3  Total Body Fat % 48 46.4 42.6 40.8  Visceral Fat '20 18 15 13  '$ Fat-Free Mass % 51.9 53.5 57.3 59.1   Total Body Water % 40.4 41.2 43.1 44  Muscle-Mass lbs 26.6 26.5 26 25.9  BMI 47.4 44.0 37.6 35.1  Body Fat Displacement             Torso  lbs 69.6 62.6 49 43.9         Left Leg  lbs 13.9 12.5 9.8 8.7         Right Leg  lbs 13.9 12.5 9.8 8.7         Left Arm  lbs 6.9 6.2 4.9 4.3         Right Arm   lbs 6.9 6.2 4.9 4.3   Clinical  Medical hx: DM, hyperlipemia, discoid lupus Medications: ozempic, metformin  Labs: A1C 5.3 Notable signs/symptoms: N/A Any previous deficiencies? Vitamin D, Low iron   Lifestyle & Dietary Hx   Pt is doing phenominally! Pt states she has a really good grasp on how to feed her body.  Pt states she will eat what she what's in appropriate amounts.  Pt states she recognizes non hunger feelings and keeps them in check.  Pt state seh is going to [place] and [employer].    Pt states seh has been doing pilate's for about 1 year now.  Pt states [place] and [place] amazing.  Pt staets seh si not happy with her slow weight loss. Pt states she feels she is losoing strength in her arms Pt staets seh os going to [place] using th [employer].Pt staet seh has a cardiac stress test which went well.    Estimated daily fluid intake: 32+ oz Estimated daily protein intake: 40-60 g Supplements: multi, calcium Current average weekly physical activity: walking a mile or two four times a week, palates an hour once a week.  Pt states piliates is great: walking and kyaking and now golfing   24-Hr Dietary Recall: 1-2 pieces of fruit daily First Meal: kodiak  waffle + sometimes peanut butter or choffles (egg, meat, and cheese) Snack:  banana Second Meal: 3 meatballs + marainara + cheese Snack:   Third Meal: broccoli + pork chop + scallop potato Snack:  Beverages: water, gatorade zero, water flavorings  Post-Op Goals/ Signs/ Symptoms Using straws: no Drinking while eating: no Chewing/swallowing difficulties: no Changes in vision: no Changes to mood/headaches: no Hair loss/changes to skin/nails: no Difficulty focusing/concentrating: no Sweating: no Limb weakness: no Dizziness/lightheadedness: no Palpitations: no  Carbonated/caffeinated beverages: no N/V/D/C/Gas: no Abdominal pain: no Dumping syndrome: no    NUTRITION DIAGNOSIS  Overweight/obesity ([place]-3.3) related to past poor dietary habits and physical inactivity as evidenced by completed bariatric surgery and following dietary guidelines for continued weight loss and healthy nutrition status.     NUTRITION INTERVENTION Nutrition counseling (C-1) and education (E-2) to facilitate bariatric surgery goals, including:  The importance of consuming adequate calories as well as certain nutrients daily due to the body's need for essential vitamins, minerals, and fats The importance of daily physical activity and to reach a goal of at least 150 minutes of moderate to vigorous physical activity weekly (or as directed  by their physician) due to benefits such as increased musculature and improved lab values The importance of intuitive eating specifically learning hunger-satiety cues and understanding the importance of learning a new body: The importance of mindful eating to avoid grazing behaviors  Encouraged patient to honor their body's internal hunger and fullness cues.  Throughout the day, check in mentally and rate hunger. Stop eating when satisfied not full regardless of how much food is left on the plate.  Get more if still hungry 20-30 minutes later.  The key is to honor satisfaction so  throughout the meal, rate fullness factor and stop when comfortably satisfied not physically full. The key is to honor hunger and fullness without any feelings of guilt or shame.  Pay attention to what the internal cues are, rather than any external factors. This will enhance the confidence you have in listening to your own body and following those internal cues enabling you to increase how often you eat when you are hungry not out of appetite and stop when you are satisfied not full.  Encouraged pt to continue to eat balanced meals inclusive of non starchy vegetables 2 times a day 7 days a week Encouraged pt to choose lean protein sources: limiting beef, pork, sausage, hotdogs, and lunch meat Encourage pt to choose healthy fats such as plant based limiting animal fats Encouraged pt to continue to drink a minium 64 fluid ounces with half being plain water to satisfy proper hydration    Learning Style & Readiness for Change Teaching method utilized: Visual & Auditory  Demonstrated degree of understanding via: Teach Back  Readiness Level: action Barriers to learning/adherence to lifestyle change: none identified   RD's Notes for Next Visit Assess adherence to pt chosen goals    MONITORING & EVALUATION Dietary intake, weekly physical activity, body weight  Next Steps Patient is to follow-up in 3 months

## 2022-09-06 ENCOUNTER — Encounter: Payer: Self-pay | Admitting: Internal Medicine

## 2022-09-06 ENCOUNTER — Ambulatory Visit (INDEPENDENT_AMBULATORY_CARE_PROVIDER_SITE_OTHER): Payer: 59 | Admitting: Internal Medicine

## 2022-09-06 VITALS — BP 108/68 | HR 70 | Ht 59.0 in | Wt 174.8 lb

## 2022-09-06 DIAGNOSIS — E785 Hyperlipidemia, unspecified: Secondary | ICD-10-CM | POA: Diagnosis not present

## 2022-09-06 DIAGNOSIS — E1165 Type 2 diabetes mellitus with hyperglycemia: Secondary | ICD-10-CM

## 2022-09-06 DIAGNOSIS — E669 Obesity, unspecified: Secondary | ICD-10-CM

## 2022-09-06 LAB — POCT GLYCOSYLATED HEMOGLOBIN (HGB A1C): Hemoglobin A1C: 5.4 % (ref 4.0–5.6)

## 2022-09-06 NOTE — Progress Notes (Addendum)
Patient ID: Connie Hall, female   DOB: 23-Feb-1966, 57 y.o.   MRN: [id number]  HPI: Connie Hall is a 57 y.o.-year-old female, referred by her PCP, Dr. Renold Genta, for management of DM2, dx at least in 2012, without long-term complications and also obesity.  Last visit 6 months ago.  Interim history: She had gastric bypass (R en Y) surgery with Dr. Thermon Leyland ([place]) - 11/21/2021. She lost approximately 84 pounds since then. No increased urination, blurry vision, nausea, chest pain.   She continues to eat low-carb.  She also continues to see the nutritionist.  She is doing very well diet and weight wise.   She also continues to exercise: walking, pilates, golf - kayaking in the summer. She was off Ozempic for a period of time >> HbA1c increased >> she restarted. She feels great, has good energy.  Reviewed history: She was seen in the weight management clinic by Dr. Leafy Ro. Also tried Weight watchers, Optivia, other meal-replacement plans, w/o good results. She gained ~50 lbs in last 5 years.   At our visit from 01/2021, she was contemplating weight loss surgery, which was employed by 2 of her sisters, with success.  Reviewed HbA1c levels: Lab Results  Component Value Date   HGBA1C 5.7 (H) 06/14/2022   HGBA1C 5.3 03/01/2022   HGBA1C 6.1 (H) 11/16/2021   HGBA1C 5.8 (H) 06/06/2021   HGBA1C 6.2 (A) 01/26/2021   HGBA1C 7.1 (A) 09/27/2020   HGBA1C 6.5 (H) 05/13/2020   HGBA1C 7.0 (A) 02/05/2020   HGBA1C 6.7 (H) 04/21/2019   HGBA1C 6.4 (H) 09/09/2018   HGBA1C 5.8 (H) 03/11/2018   HGBA1C 6.2 (H) 12/25/2017   HGBA1C 6.0 (H) 08/08/2017   HGBA1C 6.1 (H) 02/07/2017   HGBA1C 6.1 (H) 07/26/2016   HGBA1C 6.4 (H) 02/02/2016   HGBA1C 6.1 (H) 08/02/2015   HGBA1C 6.3 (H) 01/18/2015   HGBA1C 6.2 (H) 07/06/2014   HGBA1C 6.0 (H) 01/05/2014   She is on: - Ozempic 0.25 >> 0.5 >> 1 mg weekly She was previously on metformin, stopped 02/2022.  Pt checks her sugars seldom: - am: 90-130 (cookie) >>  80-100 >> 90s >> 80-90s >> 85-92 - 2h after b'fast: n/c  - lunch: n/c - 2h after lunch: 100-115 >> 70-90 >> 110-115 >> n/c - dinner: n/c - 2h after dinner: up to 160 >> < or =160 >> 103 >> 90-100 >> <100 - bedtime: n/c Lowest sugar was 80 >> 88 >> 70 >> 90 >> 80 >> 70; it is unclear at which level she has hypoglycemia awareness. Highest sugar was 160 >> 160 >> 118 >> 110 >> 100.  -No CKD, last BUN/creatinine:  Lab Results  Component Value Date   BUN 19 06/14/2022   BUN 23 (H) 11/16/2021   CREATININE 0.71 06/14/2022   CREATININE 0.90 11/21/2021  She is not on ACE inhibitor or ARB.  -+ Dyslipidemia; last set of lipids: Lab Results  Component Value Date   CHOL 125 06/14/2022   HDL 41 (L) 06/14/2022   LDLCALC 63 06/14/2022   TRIG 124 06/14/2022   CHOLHDL 3.0 06/14/2022  On Lipitor 10.  - last eye exam was in 12/2021: No DR.+ history of retinal holes. Dr. Jerline Pain.  - she denies numbness and tingling in her feet. Last foot exam 02/2022.  Pt has FH of DM in father (died of pancreatic cancer), 4 sisters.  No h/o pancreatitis or FH of MTC.  She has a history of OSA.  ROS: + see HPI  I reviewed  pt's medications, allergies, PMH, social hx, family hx, and changes were documented in the history of present illness. Otherwise, unchanged from my initial visit note.  Past Medical History:  Diagnosis Date   Back pain    Complication of anesthesia    low O2 sat   Deviated nasal septum    Discoid lupus    DM (diabetes mellitus) (HCC)    Family history of pancreatic cancer    Family history of prostate cancer    History of discoid lupus erythematosus    Hyperlipidemia    Leg edema    Obesity    OSA (obstructive sleep apnea)    Restless legs syndrome (RLS)    Vitamin D deficiency    Past Surgical History:  Procedure Laterality Date   BIOPSY  07/24/2021   Procedure: BIOPSY;  Surgeon: Irving Copas., MD;  Location: [facility];  Service: Gastroenterology;;    COLONOSCOPY WITH PROPOFOL N/A 07/24/2021   Procedure: COLONOSCOPY WITH PROPOFOL;  Surgeon: Irving Copas., MD;  Location: [facility];  Service: Gastroenterology;  Laterality: N/A;   ESOPHAGOGASTRODUODENOSCOPY (EGD) WITH PROPOFOL N/A 07/24/2021   Procedure: ESOPHAGOGASTRODUODENOSCOPY (EGD) WITH PROPOFOL;  Surgeon: Rush Landmark Telford Nab., MD;  Location: [facility];  Service: Gastroenterology;  Laterality: N/A;   POLYPECTOMY  07/24/2021   Procedure: POLYPECTOMY;  Surgeon: Rush Landmark Telford Nab., MD;  Location: [facility];  Service: Gastroenterology;;   ROTATOR CUFF REPAIR  2009   left   Sebaceous cyst removal, back  03/27/2008   XI ROBOTIC ASSISTED HIATAL HERNIA REPAIR N/A 11/21/2021   Procedure: XI ROBOTIC ASSISTED HIATAL HERNIA REPAIR;  Surgeon: Felicie Morn, MD;  Location: [facility];  Service: General;  Laterality: N/A;   Social History   Socioeconomic History   Marital status: Single    Spouse name: Not on file   Number of children: 0   Years of education: Not on file   Highest education level: Not on file  Occupational History   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    Employer: [employer]  Tobacco Use   Smoking status: Former    Packs/day: 1.00    Years: 15.00    Total pack years: 15.00    Types: Cigarettes    Quit date: 08/27/1998    Years since quitting: 24.0   Smokeless tobacco: Never  Vaping Use   Vaping Use: Never used  Substance and Sexual Activity   Alcohol use: Yes    Comment: rare   Drug use: No   Sexual activity: Not on file  Other Topics Concern   Not on file  Social History Narrative   Not on file   Social Determinants of Health   Financial Resource Strain: Not on file  Food Insecurity: Not on file  Transportation Needs: Not on file  Physical Activity: Not on file  Stress: Not on file  Social Connections: Not on file  Intimate Partner Violence: Not on file   Current Outpatient Medications on File Prior to Visit  Medication Sig  Dispense Refill   ALLEGRA-D ALLERGY & CONGESTION 60-120 MG 12 hr tablet TAKE 1 TABLET BY MOUTH TWICE A DAY 180 tablet 2   atorvastatin (LIPITOR) 20 MG tablet Take 1 tablet (20 mg total) by mouth daily. 90 tablet 3   Calcium Carb-Cholecalciferol (CALCIUM 600+D) 600-10 MG-MCG TABS Take by mouth 4 (four) times daily -  before meals and at bedtime.     Cholecalciferol (VITAMIN D3) 125 MCG (5000 UT) CAPS Take 5,000 Units by mouth daily.  fluticasone (FLONASE) 50 MCG/ACT nasal spray Place 2 sprays into both nostrils daily. 16 g PRN   glucose blood test strip Use as instructed 2x a day - for One Touch Verio Reflect 200 each 3   metroNIDAZOLE (METROCREAM) 0.75 % cream Apply 1 application on the skin twice a day 45 g 11   Multiple Vitamin (MULTIVITAMIN) tablet Take 1 tablet by mouth daily.     OneTouch Delica Lancets 62V MISC Use 2x a day - for One Touch Verio Reflect 200 each 3   pantoprazole (PROTONIX) 40 MG tablet Take 1 tablet by mouth daily.     rOPINIRole (REQUIP) 0.5 MG tablet Take 1 tablet (0.5 mg total) by mouth daily in the afternoon AND 2 tablets (1 mg total) at bedtime. 270 tablet 1   Semaglutide, 1 MG/DOSE, (OZEMPIC, 1 MG/DOSE,) 4 MG/3ML SOPN Inject 1 mg into the skin once a week. 9 mL 3   No current facility-administered medications on file prior to visit.   Allergies  Allergen Reactions   Sulfa Antibiotics Hives   Sulfonamide Derivatives Hives   Family History  Problem Relation Age of Onset   Skin cancer Mother    Hyperlipidemia Mother    Hypertension Father    Stroke Father    Heart disease Father    Diabetes Father    Pancreatic cancer Father 41   Prostate cancer Father        dx in his 55s; prostatectomy   Sleep apnea Father    Obesity Father    Pancreatic cancer Sister 12       neuroendocrine tumor   Colon polyps Sister    Colon polyps Sister    Colon polyps Sister    Congestive Heart Failure Maternal Aunt    Dementia Maternal Uncle    Lung cancer Paternal Aunt     Heart attack Paternal Uncle    Pancreatic cancer Paternal Uncle 59   Testicular cancer Cousin        paternal first cousin dx in his 63s   Colon cancer Neg Hx    Esophageal cancer Neg Hx    Inflammatory bowel disease Neg Hx    Liver disease Neg Hx    Stomach cancer Neg Hx    Rectal cancer Neg Hx    PE: BP 108/68 (BP Location: Right Arm, Patient Position: Sitting, Cuff Size: Normal)   Pulse 70   Ht '4\' 11"'$  (1.499 m)   Wt 174 lb 12.8 oz (79.3 kg)   LMP 02/08/2021 (Approximate)   SpO2 99%   BMI 35.31 kg/m  Wt Readings from Last 3 Encounters:  09/06/22 174 lb 12.8 oz (79.3 kg)  09/05/22 174 lb 4.8 oz (79.1 kg)  08/07/22 179 lb 12.8 oz (81.6 kg)   Constitutional: overweight, in NAD Eyes: EOMI, no exophthalmos ENT: no thyromegaly, no cervical lymphadenopathy Cardiovascular: RRR, No MRG, + pitting LE edema B Respiratory: CTA B Musculoskeletal: no deformities Skin: no rashes Neurological: no tremor with outstretched hands  ASSESSMENT: 1.  Type 2 diabetes, non-insulin-dependent, recent diagnosis, without complications  2.  Obesity class III  3. HL  PLAN:  1. Patient with history of prediabetes but a new diagnosis of diabetes in 2021, when she had an HbA1c of 7%.  She continues on Ozempic only.  At last visit, we stopped her metformin.  Her sugars improved significantly after losing a significant amount of weight after her Roux-en-Y gastric bypass surgery.  At last visit, they were excellently controlled, usually lower than 100.  Therefore, we stopped metformin but we continued metformin to further help with weight loss.  A1c at that time was 5.3% but she had another HbA1c was lower, 3 months ago, at 5.7% (off Ozempic then) >> she restarted it since. -At today's visit, sugars are all at goal.  We discussed about possibly backing off the Trulicity, but she would want to continue the same dose for now, for further help with blood sugars but also with weight loss.  She discussed with  her surgeon and he also recommended to continue on it for longer.  I advised him to let me know if she develops low blood sugars, in which case, we will decrease the dose.  For now, we can stay on the same dose. - I suggested to:  Patient Instructions  Please continue: - Ozempic 1 mg weekly   Please return in 6 months.  - we checked her HbA1c: 5.4% (improved) - advised to check sugars at different times of the day - 1x a day, rotating check times - advised for yearly eye exams >> she is UTD - return to clinic in 6 months  2.  Obesity class II -She continues on Ozempic.  We discussed in the past that if she loses more weight, to taper the dose down.  However, at last visit, we continued to 1 mg weekly.  She tolerates it well. -Before last visit, she lost 60 pounds since her gastric bypass surgery. -Since then, she lost 24 more pounds  3. HL -Reviewed latest lipid panel from 05/2022: Fractions at goal with the exception of a slightly low HDL: Lab Results  Component Value Date   CHOL 125 06/14/2022   HDL 41 (L) 06/14/2022   LDLCALC 63 06/14/2022   TRIG 124 06/14/2022   CHOLHDL 3.0 06/14/2022  -She continues on Lipitor 20 mg daily without side effects  Philemon Kingdom, MD PhD [facility] End[facility]

## 2022-09-06 NOTE — Patient Instructions (Addendum)
Please continue: - Ozempic 1 mg weekly   Please return in 6 months.

## 2022-09-10 ENCOUNTER — Other Ambulatory Visit: Payer: Self-pay

## 2022-09-13 ENCOUNTER — Other Ambulatory Visit (HOSPITAL_COMMUNITY): Payer: Self-pay

## 2022-09-13 ENCOUNTER — Other Ambulatory Visit: Payer: Self-pay

## 2022-09-15 DIAGNOSIS — G4733 Obstructive sleep apnea (adult) (pediatric): Secondary | ICD-10-CM | POA: Diagnosis not present

## 2022-10-01 DIAGNOSIS — G4733 Obstructive sleep apnea (adult) (pediatric): Secondary | ICD-10-CM | POA: Diagnosis not present

## 2022-10-05 ENCOUNTER — Other Ambulatory Visit (HOSPITAL_BASED_OUTPATIENT_CLINIC_OR_DEPARTMENT_OTHER): Payer: Self-pay

## 2022-10-05 ENCOUNTER — Other Ambulatory Visit: Payer: Self-pay

## 2022-10-16 DIAGNOSIS — G4733 Obstructive sleep apnea (adult) (pediatric): Secondary | ICD-10-CM | POA: Diagnosis not present

## 2022-11-13 ENCOUNTER — Other Ambulatory Visit: Payer: Self-pay

## 2022-11-14 DIAGNOSIS — G4733 Obstructive sleep apnea (adult) (pediatric): Secondary | ICD-10-CM | POA: Diagnosis not present

## 2022-11-20 DIAGNOSIS — L821 Other seborrheic keratosis: Secondary | ICD-10-CM | POA: Diagnosis not present

## 2022-11-20 DIAGNOSIS — L814 Other melanin hyperpigmentation: Secondary | ICD-10-CM | POA: Diagnosis not present

## 2022-11-20 DIAGNOSIS — D485 Neoplasm of uncertain behavior of skin: Secondary | ICD-10-CM | POA: Diagnosis not present

## 2022-12-06 ENCOUNTER — Other Ambulatory Visit: Payer: Self-pay

## 2022-12-06 ENCOUNTER — Other Ambulatory Visit (HOSPITAL_COMMUNITY): Payer: Self-pay

## 2022-12-15 DIAGNOSIS — G4733 Obstructive sleep apnea (adult) (pediatric): Secondary | ICD-10-CM | POA: Diagnosis not present

## 2022-12-18 NOTE — Progress Notes (Signed)
Patient Care Team: Margaree Mackintosh, MD as PCP - General (Internal Medicine) Wendall Stade, MD as PCP - Cardiology (Cardiology)  Visit Date: 12/25/22  Subjective:    Patient ID: Connie Hall , Female   DOB: [birth year], 57 y.o.    MRN: [id number]   57 y.o. Female presents today for a 6 month follow-up. Patient has a past medical history of back pain, deviated nasal sputum, discoid lupus, diabetes mellitus, hyperlipidemia, lower extremity edema, obesity, obstructive sleep apnea, restless legs syndrome, Vitamin D deficiency.  History of Type 2 diabetes mellitus treated with semaglutide 1 mg once weekly. Diagnosed 2021 when HGBA1c was at 7%. HGBA1c at 5.6% on 12/20/22, up from 5.4% three months ago. Followed by Dr. Elvera Lennox, endocrinologist.   History of hyperlipidemia treated with atorvastatin 20 mg daily. Lipid panel normal on 12/20/22. She has been doing pilates and other exercises regularly.  History of GERD treated with pantoprazole 40 mg daily.  History of restless legs syndrome treated with ropinirole 0.5 mg daily in the afternoon and 1 mg at bedtime.  History of Vitamin D deficiency treated with Vitamin D3 5,000 units daily.  ALT elevated at 31. Creatinine, microalbumin normal.   Past Medical History:  Diagnosis Date   Back pain    Complication of anesthesia    low O2 sat   Deviated nasal septum    Discoid lupus    DM (diabetes mellitus) (HCC)    Family history of pancreatic cancer    Family history of prostate cancer    History of discoid lupus erythematosus    Hyperlipidemia    Leg edema    Obesity    OSA (obstructive sleep apnea)    Restless legs syndrome (RLS)    Vitamin D deficiency      Family History  Problem Relation Age of Onset   Skin cancer Mother    Hyperlipidemia Mother    Hypertension Father    Stroke Father    Heart disease Father    Diabetes Father    Pancreatic cancer Father 42   Prostate cancer Father        dx in his 10s; prostatectomy    Sleep apnea Father    Obesity Father    Pancreatic cancer Sister 78       neuroendocrine tumor   Colon polyps Sister    Colon polyps Sister    Colon polyps Sister    Congestive Heart Failure Maternal Aunt    Dementia Maternal Uncle    Lung cancer Paternal Aunt    Heart attack Paternal Uncle    Pancreatic cancer Paternal Uncle 17   Testicular cancer Cousin        paternal first cousin dx in his 29s   Colon cancer Neg Hx    Esophageal cancer Neg Hx    Inflammatory bowel disease Neg Hx    Liver disease Neg Hx    Stomach cancer Neg Hx    Rectal cancer Neg Hx     Social History   Social History Narrative   Not on file      Review of Systems  Constitutional:  Negative for fever and malaise/fatigue.  HENT:  Negative for congestion.   Eyes:  Negative for blurred vision.  Respiratory:  Negative for cough and shortness of breath.   Cardiovascular:  Negative for chest pain, palpitations and leg swelling.  Gastrointestinal:  Negative for vomiting.  Musculoskeletal:  Negative for back pain.  Skin:  Negative for rash.  Neurological:  Negative for loss of consciousness and headaches.        Objective:   Vitals: BP 124/78   Pulse 67   Temp 98 F (36.7 C) (Tympanic)   Ht 4\' 11"  (1.499 m)   Wt 174 lb (78.9 kg)   LMP 02/08/2021 (Approximate)   SpO2 98%   BMI 35.14 kg/m    Physical Exam Vitals and nursing note reviewed.  Constitutional:      General: She is not in acute distress.    Appearance: Normal appearance. She is not toxic-appearing.  HENT:     Head: Normocephalic and atraumatic.  Cardiovascular:     Rate and Rhythm: Normal rate and regular rhythm. No extrasystoles are present.    Pulses: Normal pulses.     Heart sounds: Normal heart sounds. No murmur heard.    No friction rub. No gallop.  Pulmonary:     Effort: Pulmonary effort is normal. No respiratory distress.     Breath sounds: Normal breath sounds. No wheezing or rales.  Skin:    General: Skin is  warm and dry.  Neurological:     Mental Status: She is alert and oriented to person, place, and time. Mental status is at baseline.  Psychiatric:        Mood and Affect: Mood normal.        Behavior: Behavior normal.        Thought Content: Thought content normal.        Judgment: Judgment normal.       Results:   Studies obtained and personally reviewed by me:  CT coronary calcium score at 304 on 07/02/22.  Labs:       Component Value Date/Time   NA 142 06/14/2022 0912   NA 140 12/25/2017 0923   K 4.5 06/14/2022 0912   CL 104 06/14/2022 0912   CO2 30 06/14/2022 0912   GLUCOSE 88 06/14/2022 0912   BUN 19 06/14/2022 0912   BUN 14 12/25/2017 0923   CREATININE 0.71 06/14/2022 0912   CALCIUM 9.7 06/14/2022 0912   PROT 6.6 12/20/2022 0920   PROT 7.0 12/25/2017 0923   ALBUMIN 4.4 11/16/2021 1303   ALBUMIN 4.0 12/25/2017 0923   AST 19 12/20/2022 0920   ALT 31 (H) 12/20/2022 0920   ALKPHOS 50 11/16/2021 1303   BILITOT 0.4 12/20/2022 0920   BILITOT 0.3 12/25/2017 0923   GFRNONAA >60 11/21/2021 1409   GFRNONAA 78 05/13/2020 0914   GFRAA 90 05/13/2020 0914     Lab Results  Component Value Date   WBC 6.2 06/14/2022   HGB 11.8 06/14/2022   HCT 37.1 06/14/2022   MCV 89.4 06/14/2022   PLT 290 06/14/2022    Lab Results  Component Value Date   CHOL 142 12/20/2022   HDL 56 12/20/2022   LDLCALC 68 12/20/2022   TRIG 97 12/20/2022   CHOLHDL 2.5 12/20/2022    Lab Results  Component Value Date   HGBA1C 5.6 12/20/2022     Lab Results  Component Value Date   TSH 2.71 06/14/2022      Assessment & Plan:   Type 2 diabetes mellitus: treated with semaglutide 1 mg once weekly. HGBA1c at 5.6% on 12/20/22, up from 5.4% three months ago. Followed by Dr. Elvera Lennox, Endocrinologist. She would like to get Dexcom meter. Have asked my CMA to handle this through [facility].  Hyperlipidemia: treated with atorvastatin 20 mg daily. Lipid panel normal on 12/20/22. She has  been doing pilates and other  exercises regularly. CT coronary calcium score at 304 on 07/02/22.  GERD: treated with pantoprazole 40 mg daily.  Restless legs syndrome: treated with ropinirole 0.5 mg daily in the afternoon and 1 mg at bedtime.  Vitamin D deficiency: treated with Vitamin D3 5,000 units daily.  Return in 6 months for health maintenance exam.No change in meds at this time    I,Alexander Ruley,acting as a scribe for Margaree Mackintosh, MD.,have documented all relevant documentation on the behalf of Margaree Mackintosh, MD,as directed by  Margaree Mackintosh, MD while in the presence of Margaree Mackintosh, MD.   I, Margaree Mackintosh, MD, have reviewed all documentation for this visit. The documentation on 12/25/22 for the exam, diagnosis, procedures, and orders are all accurate and complete.

## 2022-12-20 ENCOUNTER — Other Ambulatory Visit: Payer: 59

## 2022-12-20 DIAGNOSIS — E785 Hyperlipidemia, unspecified: Secondary | ICD-10-CM

## 2022-12-20 DIAGNOSIS — E119 Type 2 diabetes mellitus without complications: Secondary | ICD-10-CM | POA: Diagnosis not present

## 2022-12-21 LAB — HEPATIC FUNCTION PANEL
AG Ratio: 1.9 (calc) (ref 1.0–2.5)
ALT: 31 U/L — ABNORMAL HIGH (ref 6–29)
AST: 19 U/L (ref 10–35)
Albumin: 4.3 g/dL (ref 3.6–5.1)
Alkaline phosphatase (APISO): 61 U/L (ref 37–153)
Bilirubin, Direct: 0.1 mg/dL (ref 0.0–0.2)
Globulin: 2.3 g/dL (calc) (ref 1.9–3.7)
Indirect Bilirubin: 0.3 mg/dL (calc) (ref 0.2–1.2)
Total Bilirubin: 0.4 mg/dL (ref 0.2–1.2)
Total Protein: 6.6 g/dL (ref 6.1–8.1)

## 2022-12-21 LAB — LIPID PANEL
Cholesterol: 142 mg/dL (ref <200)
HDL: 56 mg/dL (ref >=50)
LDL Cholesterol (Calc): 68 mg/dL (calc)
Non-HDL Cholesterol (Calc): 86 mg/dL (calc) (ref <130)
Total CHOL/HDL Ratio: 2.5 (calc) (ref <5.0)
Triglycerides: 97 mg/dL (ref <150)

## 2022-12-21 LAB — MICROALBUMIN / CREATININE URINE RATIO
Creatinine, Urine: 113 mg/dL (ref 20–275)
Microalb Creat Ratio: 12 mg/g creat (ref <30)
Microalb, Ur: 1.3 mg/dL

## 2022-12-21 LAB — HEMOGLOBIN A1C
Hgb A1c MFr Bld: 5.6 % of total Hgb (ref <5.7)
Mean Plasma Glucose: 114 mg/dL
eAG (mmol/L): 6.3 mmol/L

## 2022-12-25 ENCOUNTER — Ambulatory Visit: Payer: 59 | Admitting: Internal Medicine

## 2022-12-25 ENCOUNTER — Other Ambulatory Visit (HOSPITAL_COMMUNITY): Payer: Self-pay

## 2022-12-25 ENCOUNTER — Other Ambulatory Visit: Payer: Self-pay

## 2022-12-25 ENCOUNTER — Encounter: Payer: Self-pay | Admitting: Internal Medicine

## 2022-12-25 VITALS — BP 124/78 | HR 67 | Temp 98.0°F | Ht 59.0 in | Wt 174.0 lb

## 2022-12-25 DIAGNOSIS — G2581 Restless legs syndrome: Secondary | ICD-10-CM | POA: Diagnosis not present

## 2022-12-25 DIAGNOSIS — R931 Abnormal findings on diagnostic imaging of heart and coronary circulation: Secondary | ICD-10-CM

## 2022-12-25 DIAGNOSIS — Z9889 Other specified postprocedural states: Secondary | ICD-10-CM | POA: Diagnosis not present

## 2022-12-25 DIAGNOSIS — H59811 Chorioretinal scars after surgery for detachment, right eye: Secondary | ICD-10-CM | POA: Diagnosis not present

## 2022-12-25 DIAGNOSIS — K219 Gastro-esophageal reflux disease without esophagitis: Secondary | ICD-10-CM

## 2022-12-25 DIAGNOSIS — Z6835 Body mass index (BMI) 35.0-35.9, adult: Secondary | ICD-10-CM

## 2022-12-25 DIAGNOSIS — E119 Type 2 diabetes mellitus without complications: Secondary | ICD-10-CM | POA: Diagnosis not present

## 2022-12-25 DIAGNOSIS — H04123 Dry eye syndrome of bilateral lacrimal glands: Secondary | ICD-10-CM | POA: Diagnosis not present

## 2022-12-25 DIAGNOSIS — H43811 Vitreous degeneration, right eye: Secondary | ICD-10-CM | POA: Diagnosis not present

## 2022-12-25 DIAGNOSIS — H18593 Other hereditary corneal dystrophies, bilateral: Secondary | ICD-10-CM | POA: Diagnosis not present

## 2022-12-25 MED ORDER — DEXCOM G7 RECEIVER DEVI
0 refills | Status: DC
  Filled 2022-12-25: qty 1, 90d supply, fill #0

## 2022-12-25 MED ORDER — DEXCOM G7 SENSOR MISC
3 refills | Status: DC
  Filled 2022-12-25: qty 1, 30d supply, fill #0

## 2022-12-25 NOTE — Patient Instructions (Addendum)
It was a pleasure to see you today. Will ask my CMA to contact  out patient Pharmacy about your request for Dex Com meter. Labs are stable. Continue diet and exercise regimen. RTC in 6 months for health maintenance exam.

## 2022-12-26 ENCOUNTER — Other Ambulatory Visit: Payer: Self-pay

## 2022-12-28 ENCOUNTER — Other Ambulatory Visit: Payer: Self-pay | Admitting: Internal Medicine

## 2022-12-28 ENCOUNTER — Other Ambulatory Visit (HOSPITAL_COMMUNITY): Payer: Self-pay

## 2022-12-28 MED ORDER — DEXCOM G7 RECEIVER DEVI
0 refills | Status: DC
  Filled 2022-12-28 – 2023-01-11 (×2): qty 1, 90d supply, fill #0

## 2022-12-28 MED ORDER — DEXCOM G7 SENSOR MISC
3 refills | Status: DC
  Filled 2022-12-28: qty 1, 10d supply, fill #0
  Filled 2023-01-11: qty 1, 30d supply, fill #0

## 2022-12-31 ENCOUNTER — Other Ambulatory Visit: Payer: Self-pay

## 2023-01-01 ENCOUNTER — Other Ambulatory Visit: Payer: Self-pay

## 2023-01-02 ENCOUNTER — Other Ambulatory Visit (HOSPITAL_COMMUNITY): Payer: Self-pay

## 2023-01-02 ENCOUNTER — Other Ambulatory Visit: Payer: Self-pay

## 2023-01-04 ENCOUNTER — Encounter: Payer: Self-pay | Admitting: Internal Medicine

## 2023-01-04 ENCOUNTER — Other Ambulatory Visit (HOSPITAL_COMMUNITY): Payer: Self-pay

## 2023-01-04 ENCOUNTER — Telehealth: Payer: Self-pay

## 2023-01-04 ENCOUNTER — Other Ambulatory Visit: Payer: Self-pay

## 2023-01-04 NOTE — Telephone Encounter (Signed)
Patient Advocate Encounter   Received notification from pt msgs that prior authorization is required for Dexcom G7 sensor  Submitted: 01/04/23 Key [id number]  Status is pending

## 2023-01-07 ENCOUNTER — Other Ambulatory Visit (HOSPITAL_COMMUNITY): Payer: Self-pay

## 2023-01-08 NOTE — Telephone Encounter (Signed)
Patient Advocate Encounter  Received a fax from [employer] regarding Prior Authorization for Dexcom G7 sensor.   Authorization has been DENIED due to      Determination letter attached to patient chart

## 2023-01-11 ENCOUNTER — Other Ambulatory Visit (HOSPITAL_COMMUNITY): Payer: Self-pay

## 2023-01-14 DIAGNOSIS — G4733 Obstructive sleep apnea (adult) (pediatric): Secondary | ICD-10-CM | POA: Diagnosis not present

## 2023-02-01 ENCOUNTER — Other Ambulatory Visit (HOSPITAL_COMMUNITY): Payer: Self-pay

## 2023-02-01 ENCOUNTER — Other Ambulatory Visit: Payer: Self-pay | Admitting: Internal Medicine

## 2023-02-01 MED ORDER — DEXCOM G7 SENSOR MISC
1 refills | Status: DC
  Filled 2023-02-01: qty 3, 30d supply, fill #0

## 2023-02-02 ENCOUNTER — Other Ambulatory Visit (HOSPITAL_COMMUNITY): Payer: Self-pay

## 2023-02-04 ENCOUNTER — Other Ambulatory Visit: Payer: Self-pay | Admitting: Internal Medicine

## 2023-02-04 DIAGNOSIS — Z1231 Encounter for screening mammogram for malignant neoplasm of breast: Secondary | ICD-10-CM

## 2023-02-06 ENCOUNTER — Other Ambulatory Visit (HOSPITAL_COMMUNITY): Payer: Self-pay

## 2023-02-06 ENCOUNTER — Other Ambulatory Visit: Payer: Self-pay

## 2023-02-08 ENCOUNTER — Ambulatory Visit
Admission: RE | Admit: 2023-02-08 | Discharge: 2023-02-08 | Disposition: A | Discharge status: Home / Self Care | Payer: 59 | Source: Ambulatory Visit | Attending: Internal Medicine | Admitting: Internal Medicine

## 2023-02-08 DIAGNOSIS — Z1231 Encounter for screening mammogram for malignant neoplasm of breast: Secondary | ICD-10-CM

## 2023-02-13 ENCOUNTER — Other Ambulatory Visit: Payer: Self-pay | Admitting: Internal Medicine

## 2023-02-13 DIAGNOSIS — R928 Other abnormal and inconclusive findings on diagnostic imaging of breast: Secondary | ICD-10-CM

## 2023-02-14 DIAGNOSIS — G4733 Obstructive sleep apnea (adult) (pediatric): Secondary | ICD-10-CM | POA: Diagnosis not present

## 2023-02-20 ENCOUNTER — Ambulatory Visit
Admission: RE | Admit: 2023-02-20 | Discharge: 2023-02-20 | Disposition: A | Discharge status: Home / Self Care | Payer: 59 | Source: Ambulatory Visit | Attending: Internal Medicine | Admitting: Internal Medicine

## 2023-02-20 DIAGNOSIS — R921 Mammographic calcification found on diagnostic imaging of breast: Secondary | ICD-10-CM | POA: Diagnosis not present

## 2023-02-20 DIAGNOSIS — R928 Other abnormal and inconclusive findings on diagnostic imaging of breast: Secondary | ICD-10-CM

## 2023-02-21 ENCOUNTER — Other Ambulatory Visit: Payer: Self-pay | Admitting: Internal Medicine

## 2023-02-21 DIAGNOSIS — R921 Mammographic calcification found on diagnostic imaging of breast: Secondary | ICD-10-CM

## 2023-02-27 ENCOUNTER — Ambulatory Visit
Admission: RE | Admit: 2023-02-27 | Discharge: 2023-02-27 | Disposition: A | Discharge status: Home / Self Care | Payer: 59 | Source: Ambulatory Visit | Attending: Internal Medicine | Admitting: Internal Medicine

## 2023-02-27 DIAGNOSIS — R921 Mammographic calcification found on diagnostic imaging of breast: Secondary | ICD-10-CM | POA: Diagnosis not present

## 2023-02-27 HISTORY — PX: BREAST BIOPSY: SHX20

## 2023-03-04 ENCOUNTER — Ambulatory Visit (INDEPENDENT_AMBULATORY_CARE_PROVIDER_SITE_OTHER): Payer: 59 | Admitting: Internal Medicine

## 2023-03-04 ENCOUNTER — Encounter: Payer: Self-pay | Admitting: Internal Medicine

## 2023-03-04 VITALS — BP 110/60 | HR 71 | Ht 59.0 in | Wt 172.2 lb

## 2023-03-04 DIAGNOSIS — E785 Hyperlipidemia, unspecified: Secondary | ICD-10-CM

## 2023-03-04 DIAGNOSIS — Z7985 Long-term (current) use of injectable non-insulin antidiabetic drugs: Secondary | ICD-10-CM | POA: Diagnosis not present

## 2023-03-04 DIAGNOSIS — E1165 Type 2 diabetes mellitus with hyperglycemia: Secondary | ICD-10-CM

## 2023-03-04 MED ORDER — ONETOUCH DELICA LANCETS 33G MISC: 3 refills | Status: AC

## 2023-03-04 MED ORDER — FREESTYLE LIBRE 3 SENSOR MISC: 1.0000 | 3 refills | Status: DC

## 2023-03-04 MED ORDER — GLUCOSE BLOOD VI STRP: ORAL_STRIP | 3 refills | Status: AC

## 2023-03-04 MED ORDER — OZEMPIC (2 MG/DOSE) 8 MG/3ML ~~LOC~~ SOPN
2.0000 mg | PEN_INJECTOR | SUBCUTANEOUS | 3 refills | Status: DC
  Filled 2023-03-04: qty 9, 84d supply, fill #0
  Filled 2023-03-07: qty 3, 28d supply, fill #0
  Filled 2023-04-05: qty 9, 84d supply, fill #0
  Filled 2023-04-10 – 2023-04-12 (×2): qty 3, 28d supply, fill #0
  Filled 2023-05-06: qty 3, 28d supply, fill #1
  Filled 2023-06-03: qty 3, 28d supply, fill #2
  Filled 2023-07-03 – 2023-07-15 (×4): qty 9, 84d supply, fill #3
  Filled 2023-09-23: qty 9, 84d supply, fill #4
  Filled 2023-12-11: qty 9, 84d supply, fill #5

## 2023-03-04 NOTE — Progress Notes (Signed)
Patient ID: Connie Hall, female   DOB: 03/16/1966, 57 y.o.   MRN: [id number]  HPI: Connie Hall is a 57 y.o.-year-old female, referred by her PCP, Dr. Lenord Fellers, for management of DM2, dx at least in 2012, without long-term complications and also obesity.  Last visit 6 months ago.  Interim history: She had gastric bypass (R en Y) surgery with Dr. Dossie Der ([facility]) - 11/21/2021. She lost approximately 86 pounds since then. No increased urination, blurry vision, nausea, chest pain.   She feels great, has good energy.  However, she is frustrated that she was not able to lose more weight since last visit and she feels she reached a plateau. She started the Dexcom CGM since last visit.  Unfortunately, she had to pay almost $350 out-of-pocket for 1 month.  Reviewed history: She was seen in the weight management clinic by Dr. Dalbert Garnet. Also tried Weight watchers, Optivia, other meal-replacement plans, w/o good results. She gained ~50 lbs in last 5 years.   At our visit from 01/2021, she was contemplating weight loss surgery, which was employed by 2 of her sisters, with success.  Reviewed HbA1c levels: Lab Results  Component Value Date   HGBA1C 5.6 12/20/2022   HGBA1C 5.4 09/06/2022   HGBA1C 5.7 (H) 06/14/2022   HGBA1C 5.3 03/01/2022   HGBA1C 6.1 (H) 11/16/2021   HGBA1C 5.8 (H) 06/06/2021   HGBA1C 6.2 (A) 01/26/2021   HGBA1C 7.1 (A) 09/27/2020   HGBA1C 6.5 (H) 05/13/2020   HGBA1C 7.0 (A) 02/05/2020   HGBA1C 6.7 (H) 04/21/2019   HGBA1C 6.4 (H) 09/09/2018   HGBA1C 5.8 (H) 03/11/2018   HGBA1C 6.2 (H) 12/25/2017   HGBA1C 6.0 (H) 08/08/2017   HGBA1C 6.1 (H) 02/07/2017   HGBA1C 6.1 (H) 07/26/2016   HGBA1C 6.4 (H) 02/02/2016   HGBA1C 6.1 (H) 08/02/2015   HGBA1C 6.3 (H) 01/18/2015   She is on: - Ozempic 0.25 >> 0.5 >> 1 mg weekly She was previously on metformin, stopped 02/2022. She was off Ozempic for a period of time but her HbA1c increased and she restarted it.  Pt checks her sugars >4x  a day with her new Dexcoom sensor:   Prev.: - am: 90-130 (cookie) >> 80-100 >> 90s >> 80-90s >> 85-92 - 2h after b'fast: n/c  - lunch: n/c - 2h after lunch: 100-115 >> 70-90 >> 110-115 >> n/c - dinner: n/c - 2h after dinner:  < or =160 >> 103 >> 90-100 >> <100 - bedtime: n/c Lowest sugar was 80 >> 88 >> 70 >> 90 >> 80 >> 70 >> 60s; it is unclear at which level she has hypoglycemia awareness. Highest sugar was 160 >> 160 >> 118 >> 110 >> 100 >> 200s.  -No CKD, last BUN/creatinine:  Lab Results  Component Value Date   BUN 19 06/14/2022   BUN 23 (H) 11/16/2021   CREATININE 0.71 06/14/2022   CREATININE 0.90 11/21/2021  She is not on ACE inhibitor or ARB.  -+ Dyslipidemia; last set of lipids: Lab Results  Component Value Date   CHOL 142 12/20/2022   HDL 56 12/20/2022   LDLCALC 68 12/20/2022   TRIG 97 12/20/2022   CHOLHDL 2.5 12/20/2022  On Lipitor 10.  - last eye exam was in 11/2022: No DR reportedly.+ history of retinal holes. Dr. Jimmey Ralph.  - she denies numbness and tingling in her feet. Last foot exam 02/2022.  Pt has FH of DM in father (died of pancreatic cancer), 4 sisters. No h/o pancreatitis or FH of  MTC.  She has a history of OSA.  ROS: + see HPI  I reviewed pt's medications, allergies, PMH, social hx, family hx, and changes were documented in the history of present illness. Otherwise, unchanged from my initial visit note.  Past Medical History:  Diagnosis Date   Back pain    Complication of anesthesia    low O2 sat   Deviated nasal septum    Discoid lupus    DM (diabetes mellitus) (HCC)    Family history of pancreatic cancer    Family history of prostate cancer    History of discoid lupus erythematosus    Hyperlipidemia    Leg edema    Obesity    OSA (obstructive sleep apnea)    Restless legs syndrome (RLS)    Vitamin D deficiency    Past Surgical History:  Procedure Laterality Date   BIOPSY  07/24/2021   Procedure: BIOPSY;  Surgeon: Lemar Lofty., MD;  Location: [facility];  Service: Gastroenterology;;   BREAST BIOPSY Right 02/27/2023   MM RT BREAST BX W LOC DEV 1ST LESION IMAGE BX SPEC STEREO GUIDE 02/27/2023 [facility]   COLONOSCOPY WITH PROPOFOL N/A 07/24/2021   Procedure: COLONOSCOPY WITH PROPOFOL;  Surgeon: Lemar Lofty., MD;  Location: [facility];  Service: Gastroenterology;  Laterality: N/A;   ESOPHAGOGASTRODUODENOSCOPY (EGD) WITH PROPOFOL N/A 07/24/2021   Procedure: ESOPHAGOGASTRODUODENOSCOPY (EGD) WITH PROPOFOL;  Surgeon: Meridee Score Netty Starring., MD;  Location: [facility];  Service: Gastroenterology;  Laterality: N/A;   POLYPECTOMY  07/24/2021   Procedure: POLYPECTOMY;  Surgeon: Meridee Score Netty Starring., MD;  Location: [facility];  Service: Gastroenterology;;   ROTATOR CUFF REPAIR  2009   left   Sebaceous cyst removal, back  03/27/2008   XI ROBOTIC ASSISTED HIATAL HERNIA REPAIR N/A 11/21/2021   Procedure: XI ROBOTIC ASSISTED HIATAL HERNIA REPAIR;  Surgeon: Quentin Ore, MD;  Location: [facility];  Service: General;  Laterality: N/A;   Social History   Socioeconomic History   Marital status: Single    Spouse name: Not on file   Number of children: 0   Years of education: Not on file   Highest education level: Bachelor's degree (e.g., BA, AB, BS)  Occupational History   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    Employer: [employer]  Tobacco Use   Smoking status: Former    Packs/day: 1.00    Years: 15.00    Additional pack years: 0.00    Total pack years: 15.00    Types: Cigarettes    Quit date: 08/27/1998    Years since quitting: 24.5   Smokeless tobacco: Never  Vaping Use   Vaping Use: Never used  Substance and Sexual Activity   Alcohol use: Yes    Comment: rare   Drug use: No   Sexual activity: Not on file  Other Topics Concern   Not on file  Social History Narrative   Not on file   Social Determinants of Health   Financial Resource Strain: Low Risk  (12/21/2022)    Overall Financial Resource Strain (CARDIA)    Difficulty of Paying Living Expenses: Not hard at all  Food Insecurity: No Food Insecurity (12/21/2022)   Hunger Vital Sign    Worried About Running Out of Food in the Last Year: Never true    Ran Out of Food in the Last Year: Never true  Transportation Needs: No Transportation Needs (12/21/2022)   PRAPARE - Administrator, Civil Service (Medical): No  Lack of Transportation (Non-Medical): No  Physical Activity: Sufficiently Active (12/21/2022)   Exercise Vital Sign    Days of Exercise per Week: 4 days    Minutes of Exercise per Session: 40 min  Stress: No Stress Concern Present (12/21/2022)   [employer] of Occupational Health - Occupational Stress Questionnaire    Feeling of Stress : Only a little  Social Connections: Moderately Integrated (12/21/2022)   Social Connection and Isolation Panel [NHANES]    Frequency of Communication with Friends and Family: More than three times a week    Frequency of Social Gatherings with Friends and Family: More than three times a week    Attends Religious Services: More than 4 times per year    Active Member of [employer] or Organizations: Yes    Attends Engineer, structural: More than 4 times per year    Marital Status: Never married  Catering manager Violence: Not on file   Current Outpatient Medications on File Prior to Visit  Medication Sig Dispense Refill   ALLEGRA-D ALLERGY & CONGESTION 60-120 MG 12 hr tablet TAKE 1 TABLET BY MOUTH TWICE A DAY 180 tablet 2   atorvastatin (LIPITOR) 20 MG tablet Take 1 tablet (20 mg total) by mouth daily. 90 tablet 3   Calcium Carb-Cholecalciferol (CALCIUM 600+D) 600-10 MG-MCG TABS Take by mouth 4 (four) times daily -  before meals and at bedtime.     Cholecalciferol (VITAMIN D3) 125 MCG (5000 UT) CAPS Take 5,000 Units by mouth daily.     Continuous Glucose Receiver (DEXCOM G7 RECEIVER) DEVI Use as directed 1 each 0   Continuous Glucose Sensor  (DEXCOM G7 SENSOR) MISC Change sensor every 10 days 3 each 1   fluticasone (FLONASE) 50 MCG/ACT nasal spray Place 2 sprays into both nostrils daily. 16 g PRN   glucose blood test strip Use as instructed 2x a day - for One Touch Verio Reflect 200 each 3   metroNIDAZOLE (METROCREAM) 0.75 % cream Apply 1 application on the skin twice a day 45 g 11   Multiple Vitamin (MULTIVITAMIN) tablet Take 1 tablet by mouth daily.     OneTouch Delica Lancets 33G MISC Use 2x a day - for One Touch Verio Reflect 200 each 3   rOPINIRole (REQUIP) 0.5 MG tablet Take 1 tablet (0.5 mg total) by mouth daily in the afternoon AND 2 tablets (1 mg total) at bedtime. 270 tablet 1   Semaglutide, 1 MG/DOSE, (OZEMPIC, 1 MG/DOSE,) 4 MG/3ML SOPN Inject 1 mg into the skin once a week. 9 mL 3   No current facility-administered medications on file prior to visit.   Allergies  Allergen Reactions   Sulfa Antibiotics Hives   Sulfonamide Derivatives Hives   Family History  Problem Relation Age of Onset   Skin cancer Mother    Hyperlipidemia Mother    Hypertension Father    Stroke Father    Heart disease Father    Diabetes Father    Pancreatic cancer Father 20   Prostate cancer Father        dx in his 54s; prostatectomy   Sleep apnea Father    Obesity Father    Pancreatic cancer Sister 43       neuroendocrine tumor   Colon polyps Sister    Colon polyps Sister    Colon polyps Sister    Congestive Heart Failure Maternal Aunt    Dementia Maternal Uncle    Lung cancer Paternal Aunt    Heart attack Paternal Uncle  Pancreatic cancer Paternal Uncle 35   Testicular cancer Cousin        paternal first cousin dx in his 76s   Colon cancer Neg Hx    Esophageal cancer Neg Hx    Inflammatory bowel disease Neg Hx    Liver disease Neg Hx    Stomach cancer Neg Hx    Rectal cancer Neg Hx    PE: BP 110/60   Pulse 71   Ht 4\' 11"  (1.499 m)   Wt 172 lb 3.2 oz (78.1 kg)   LMP 02/08/2021 (Approximate)   SpO2 99%   BMI 34.78  kg/m  Wt Readings from Last 3 Encounters:  03/04/23 172 lb 3.2 oz (78.1 kg)  12/25/22 174 lb (78.9 kg)  09/06/22 174 lb 12.8 oz (79.3 kg)   Constitutional: overweight, in NAD Eyes: EOMI, no exophthalmos ENT: no thyromegaly, no cervical lymphadenopathy Cardiovascular: RRR, No MRG, + pitting LE edema B Respiratory: CTA B Musculoskeletal: no deformities Skin: no rashes Neurological: no tremor with outstretched hands Diabetic Foot Exam - Simple   Simple Foot Form Diabetic Foot exam was performed with the following findings: Yes 03/04/2023  4:21 PM  Visual Inspection No deformities, no ulcerations, no other skin breakdown bilaterally: Yes Sensation Testing Intact to touch and monofilament testing bilaterally: Yes Pulse Check Posterior Tibialis and Dorsalis pulse intact bilaterally: Yes Comments    ASSESSMENT: 1.  Type 2 diabetes, non-insulin-dependent, recent diagnosis, without complications  2.  Obesity class III  3. HL  PLAN:  1. Patient with history of prediabetes but the new diagnosis of diabetes in 2021, when HbA1c returned 7%.  She is on Ozempic only.  She was previously on metformin, which we were able to stop after she lost a significant amount of weight after her Roux-en-Y gastric bypass surgery.  At last visit, HbA1c was 5.  4% and she had another HbA1c obtained 3 months ago which was slightly higher, at 5.6%, but still normal. -At last visit, sugars were at goal so we did not change her regimen.  We did discuss that if she develops low blood sugars let me know to send a prescription for low-dose Ozempic to her pharmacy. CGM interpretation: -At today's visit, we reviewed her CGM downloads: It appears that 94% of values are in target range (goal >70%), while 4% are higher than 180 (goal <25%), and 2% are lower than 70 (goal <4%).  The calculated average blood sugar is 105.  The projected HbA1c for the next 3 months (GMI) is 5.8%. -Reviewing the CGM trends, sugars are  excellent during the night, very stable, and they are variable throughout the day, with rare blood sugars above 180.  These usually happen after dietary indiscretions.  She has had the sensor for 1 month and she is learning what foods she can and what foods she cannot eat.  She has very high blood sugars after chips and crackers so she is learning that she cannot snack on these.  She also noticed that if she has carbs at the end of the meal, they are better tolerated than if she had them at the beginning of the meals.  We discussed that she is doing a great job adjusting her diet so for now I did not recommend a change in regimen.  I did recommend to try to switch to the freestyle libre 3 CGM, which may be cheaper for her.  I sent a prescription for this to her pharmacy. -She is frustrated about the lack  of weight loss despite adjusting her diet and she would be interested to increase the Ozempic to 2 mg weekly.  We discussed about the fact that we can increase the dose, but she has to pay attention and see if she does not have more lows afterwards.  If so, we may need to reduce the dose back to 1 mg weekly.  She agrees with the plan. - I suggested to:  Patient Instructions  Please increase: - Ozempic 2 mg weekly   Try to change to [place] 3 sensor.  Please return in 6 months.  - advised to check sugars at different times of the day - 4x a day, rotating check times - advised for yearly eye exams >> she is UTD - return to clinic in 6 months  2.  Obesity class II -She continues on Ozempic -We did discuss in the past about the possibility of decreasing her Ozempic dose, but we did not do so yet. -She initially lost 60 pounds after her gastric bypass surgery and before last visit, she lost 24 more -Only lost 2 pounds since last visit -Will try to increase the Ozempic dose for a stronger effect on her weight  3. HL -Reviewed latest lipid panel from 11/2022: All fractions at goal: Lab Results   Component Value Date   CHOL 142 12/20/2022   HDL 56 12/20/2022   LDLCALC 68 12/20/2022   TRIG 97 12/20/2022   CHOLHDL 2.5 12/20/2022  -She is on Lipitor 20 mg daily without side effects  Carlus Pavlov, MD PhD [facility]

## 2023-03-04 NOTE — Patient Instructions (Addendum)
Please increase: - Ozempic 2 mg weekly   Try to change to [place] 3 sensor.  Please return in 6 months.

## 2023-03-05 ENCOUNTER — Other Ambulatory Visit (HOSPITAL_COMMUNITY): Payer: Self-pay

## 2023-03-07 ENCOUNTER — Other Ambulatory Visit (HOSPITAL_COMMUNITY): Payer: Self-pay

## 2023-03-07 ENCOUNTER — Other Ambulatory Visit (HOSPITAL_BASED_OUTPATIENT_CLINIC_OR_DEPARTMENT_OTHER): Payer: Self-pay

## 2023-03-07 ENCOUNTER — Other Ambulatory Visit: Payer: Self-pay

## 2023-03-07 ENCOUNTER — Other Ambulatory Visit: Payer: Self-pay | Admitting: Internal Medicine

## 2023-03-07 MED ORDER — ROPINIROLE HCL 0.5 MG PO TABS
ORAL_TABLET | ORAL | 1 refills | Status: DC
  Filled 2023-03-07: qty 270, 90d supply, fill #0
  Filled 2023-04-10 – 2023-06-03 (×2): qty 270, 90d supply, fill #1

## 2023-03-08 ENCOUNTER — Other Ambulatory Visit: Payer: Self-pay

## 2023-03-16 DIAGNOSIS — G4733 Obstructive sleep apnea (adult) (pediatric): Secondary | ICD-10-CM | POA: Diagnosis not present

## 2023-04-05 ENCOUNTER — Other Ambulatory Visit: Payer: Self-pay

## 2023-04-05 ENCOUNTER — Other Ambulatory Visit (HOSPITAL_COMMUNITY): Payer: Self-pay

## 2023-04-10 ENCOUNTER — Other Ambulatory Visit (HOSPITAL_COMMUNITY): Payer: Self-pay

## 2023-04-12 ENCOUNTER — Other Ambulatory Visit (HOSPITAL_COMMUNITY): Payer: Self-pay

## 2023-04-16 DIAGNOSIS — G4733 Obstructive sleep apnea (adult) (pediatric): Secondary | ICD-10-CM | POA: Diagnosis not present

## 2023-04-19 ENCOUNTER — Ambulatory Visit (INDEPENDENT_AMBULATORY_CARE_PROVIDER_SITE_OTHER): Payer: 59 | Admitting: Primary Care

## 2023-04-19 VITALS — BP 110/68 | HR 98

## 2023-04-19 DIAGNOSIS — G4733 Obstructive sleep apnea (adult) (pediatric): Secondary | ICD-10-CM | POA: Diagnosis not present

## 2023-04-19 NOTE — Progress Notes (Signed)
@Patient  ID: Connie Hall, female    DOB: [birth year], 57 y.o.   MRN: [id number]  Chief Complaint  Patient presents with   Follow-up    Referring provider: Margaree Mackintosh, MD  HPI: 57 year old female.  Past medical history significant for obstructive sleep apnea, allergic rhinitis, discoid lupus, restless leg symptoms, obesity.  Patient of Dr. Vassie Loll, last seen on 05/24/22.  04/19/2023- interim hx Patient presents today for annual follow-up OSA. Sleeping very well. Typical bedtime is 9-9:30am and she starts her day at 5am. Denies daytime sleepiness. She is 100% compliant with CPAP use, uses travel CPAP. Nasal pillow mask causing some irritation, having issues with sizing. She needs order for CPAP supplies. DME is [facility].  Airview download 03/20/23-04/18/23 Usage 17/30 days > 4 hours Pressure 7cm h20 Air[place]aks 3.9L/min AHI 0.4   Allergies  Allergen Reactions   Sulfa Antibiotics Hives   Sulfonamide Derivatives Hives    Immunization History  Administered Date(s) Administered   Influenza Inj Mdck Quad Pf 05/12/2019   Influenza Split 05/24/2014, 06/11/2016   Influenza Whole 05/27/2009, 09/27/2009   Influenza,inj,Quad PF,6+ Mos 05/17/2020, 05/27/2022   Influenza-Unspecified 06/03/2015, 05/26/2021   PFIZER(Purple Top)SARS-COV-2 Vaccination 09/03/2019, 09/21/2019, 06/15/2020, 04/08/2021   Pneumococcal Conjugate-13 01/20/2015   Tdap 12/28/2010, 03/04/2016   Zoster Recombinant(Shingrix) 09/30/2018, 02/25/2019    Past Medical History:  Diagnosis Date   Back pain    Complication of anesthesia    low O2 sat   Deviated nasal septum    Discoid lupus    DM (diabetes mellitus) (HCC)    Family history of pancreatic cancer    Family history of prostate cancer    History of discoid lupus erythematosus    Hyperlipidemia    Leg edema    Obesity    OSA (obstructive sleep apnea)    Restless legs syndrome (RLS)    Vitamin D deficiency     Tobacco History: Social History    Tobacco Use  Smoking Status Former   Current packs/day: 0.00   Average packs/day: 1 pack/day for 15.0 years (15.0 ttl pk-yrs)   Types: Cigarettes   Start date: 08/28/1983   Quit date: 08/27/1998   Years since quitting: 24.6  Smokeless Tobacco Never   Counseling given: Not Answered   Outpatient Medications Prior to Visit  Medication Sig Dispense Refill   ALLEGRA-D ALLERGY & CONGESTION 60-120 MG 12 hr tablet TAKE 1 TABLET BY MOUTH TWICE A DAY 180 tablet 2   atorvastatin (LIPITOR) 20 MG tablet Take 1 tablet (20 mg total) by mouth daily. 90 tablet 3   Calcium Carb-Cholecalciferol (CALCIUM 600+D) 600-10 MG-MCG TABS Take by mouth 4 (four) times daily -  before meals and at bedtime.     Cholecalciferol (VITAMIN D3) 125 MCG (5000 UT) CAPS Take 5,000 Units by mouth daily.     Continuous Glucose Sensor (DEXCOM G7 SENSOR) MISC Change sensor every 10 days 3 each 1   Continuous Glucose Sensor (FREESTYLE LIBRE 3 SENSOR) MISC 1 each by Does not apply route every 14 (fourteen) days. 6 each 3   fluticasone (FLONASE) 50 MCG/ACT nasal spray Place 2 sprays into both nostrils daily. 16 g PRN   glucose blood test strip Use as instructed 1x a day - for One Touch Verio Reflect 100 each 3   metroNIDAZOLE (METROCREAM) 0.75 % cream Apply 1 application on the skin twice a day 45 g 11   Multiple Vitamin (MULTIVITAMIN) tablet Take 1 tablet by mouth daily.     OneTouch Delica Lancets  33G MISC Use 2x a day - for One Touch Verio Reveal 100 each 3   rOPINIRole (REQUIP) 0.5 MG tablet Take 1 tablet (0.5 mg total) by mouth daily in the afternoon AND 2 tablets (1 mg total) at bedtime. 270 tablet 1   Semaglutide, 2 MG/DOSE, (OZEMPIC, 2 MG/DOSE,) 8 MG/3ML SOPN Inject 2 mg into the skin once a week. 9 mL 3   No facility-administered medications prior to visit.   Review of Systems  Review of Systems  Constitutional: Negative.   HENT: Negative.    Respiratory: Negative.    Cardiovascular: Negative.    Physical  Exam  BP 110/68 (BP Location: Right Arm, Cuff Size: Normal)   Pulse 98   LMP 02/08/2021 (Approximate)   SpO2 98%  Physical Exam Constitutional:      Appearance: Normal appearance.  HENT:     Head: Normocephalic and atraumatic.  Cardiovascular:     Rate and Rhythm: Normal rate and regular rhythm.  Pulmonary:     Effort: Pulmonary effort is normal.     Breath sounds: Normal breath sounds. No wheezing or rhonchi.  Skin:    General: Skin is warm and dry.  Neurological:     General: No focal deficit present.     Mental Status: She is alert and oriented to person, place, and time. Mental status is at baseline.  Psychiatric:        Mood and Affect: Mood normal.        Behavior: Behavior normal.        Thought Content: Thought content normal.        Judgment: Judgment normal.      Lab Results:  CBC    Component Value Date/Time   WBC 6.2 06/14/2022 0912   RBC 4.15 06/14/2022 0912   HGB 11.8 06/14/2022 0912   HCT 37.1 06/14/2022 0912   PLT 290 06/14/2022 0912   MCV 89.4 06/14/2022 0912   MCH 28.4 06/14/2022 0912   MCHC 31.8 (L) 06/14/2022 0912   RDW 13.2 06/14/2022 0912   LYMPHSABS 2,201 06/14/2022 0912   MONOABS 0.5 11/23/2021 0448   EOSABS 81 06/14/2022 0912   BASOSABS 37 06/14/2022 0912    BMET    Component Value Date/Time   NA 142 06/14/2022 0912   NA 140 12/25/2017 0923   K 4.5 06/14/2022 0912   CL 104 06/14/2022 0912   CO2 30 06/14/2022 0912   GLUCOSE 88 06/14/2022 0912   BUN 19 06/14/2022 0912   BUN 14 12/25/2017 0923   CREATININE 0.71 06/14/2022 0912   CALCIUM 9.7 06/14/2022 0912   GFRNONAA >60 11/21/2021 1409   GFRNONAA 78 05/13/2020 0914   GFRAA 90 05/13/2020 0914    BNP    Component Value Date/Time   BNP 20 11/26/2017 0958    ProBNP No results found for: "PROBNP"  Imaging: No results found.   Assessment & Plan:   Obstructive sleep apnea - Severe OSA well controlled on current CPAP pressure settings. Patient reports 100% compliance  with CPAP use. Download showed patient wore CPAP 17/30 days > 4 hours last 30 days. She has a travel CPAP that she on days that are not recorded. Current pressure 7cm h20; Residual AHI 0.4/hour. No changes recommended. Renew CPAP supplies. She will check with DME about getting a different sized nasal mask.  Patient continue to wear CPAP nightly for 4 to 6 hours or longer.  FU in 1 year or sooner if needed.   Glenford Bayley, NP  04/27/2023  

## 2023-04-19 NOTE — Patient Instructions (Signed)
Excellent compliance with CPAP Sleep apnea is well controlled on current pressure settings No changes Continue to wear CPAP nightly for 4-6 hours or longer Renew CPAP supplies with DME  Follow-up 1 year with Beth NP or sooner   CPAP and BIPAP Information CPAP and BIPAP are methods that use air pressure to keep your airways open and to help you breathe well. CPAP and BIPAP use different amounts of pressure. Your health care provider will tell you whether CPAP or BIPAP would be more helpful for you. CPAP stands for "continuous positive airway pressure." With CPAP, the amount of pressure stays the same while you breathe in (inhale) and out (exhale). BIPAP stands for "bi-level positive airway pressure." With BIPAP, the amount of pressure will be higher when you inhale and lower when you exhale. This allows you to take larger breaths. CPAP or BIPAP may be used in the hospital, or your health care provider may want you to use it at home. You may need to have a sleep study before your health care provider can order a machine for you to use at home. What are the advantages? CPAP or BIPAP can be helpful if you have: Sleep apnea. Chronic obstructive pulmonary disease (COPD). Heart failure. Medical conditions that cause muscle weakness, including muscular dystrophy or amyotrophic lateral sclerosis (ALS). Other problems that cause breathing to be shallow, weak, abnormal, or difficult. CPAP and BIPAP are most commonly used for obstructive sleep apnea (OSA) to keep the airways from collapsing when the muscles relax during sleep. What are the risks? Generally, this is a safe treatment. However, problems may occur, including: Irritated skin or skin sores if the mask does not fit properly. Dry or stuffy nose or nosebleeds. Dry mouth. Feeling gassy or bloated. Sinus or lung infection if the equipment is not cleaned properly. When should CPAP or BIPAP be used? In most cases, the mask only needs to be worn  during sleep. Generally, the mask needs to be worn throughout the night and during any daytime naps. People with certain medical conditions may also need to wear the mask at other times, such as when they are awake. Follow instructions from your health care provider about when to use the machine. What happens during CPAP or BIPAP?  Both CPAP and BIPAP are provided by a small machine with a flexible plastic tube that attaches to a plastic mask that you wear. Air is blown through the mask into your nose or mouth. The amount of pressure that is used to blow the air can be adjusted on the machine. Your health care provider will set the pressure setting and help you find the best mask for you. Tips for using the mask Because the mask needs to be snug, some people feel trapped or closed-in (claustrophobic) when first using the mask. If you feel this way, you may need to get used to the mask. One way to do this is to hold the mask loosely over your nose or mouth and then gradually apply the mask more snugly. You can also gradually increase the amount of time that you use the mask. Masks are available in various types and sizes. If your mask does not fit well, talk with your health care provider about getting a different one. Some common types of masks include: Full face masks, which fit over the mouth and nose. Nasal masks, which fit over the nose. Nasal pillow or prong masks, which fit into the nostrils. If you are using a mask that  fits over your nose and you tend to breathe through your mouth, a chin strap may be applied to help keep your mouth closed. Use a skin barrier to protect your skin as told by your health care provider. Some CPAP and BIPAP machines have alarms that may sound if the mask comes off or develops a leak. If you have trouble with the mask, it is very important that you talk with your health care provider about finding a way to make the mask easier to tolerate. Do not stop using the mask.  There could be a negative impact on your health if you stop using the mask. Tips for using the machine Place your CPAP or BIPAP machine on a secure table or stand near an electrical outlet. Know where the on/off switch is on the machine. Follow instructions from your health care provider about how to set the pressure on your machine and when you should use it. Do not eat or drink while the CPAP or BIPAP machine is on. Food or fluids could get pushed into your lungs by the pressure of the CPAP or BIPAP. For home use, CPAP and BIPAP machines can be rented or purchased through home health care companies. Many different brands of machines are available. Renting a machine before purchasing may help you find out which particular machine works well for you. Your health insurance company may also decide which machine you may get. Keep the CPAP or BIPAP machine and attachments clean. Ask your health care provider for specific instructions. Check the humidifier if you have a dry stuffy nose or nosebleeds. Make sure it is working correctly. Follow these instructions at home: Take over-the-counter and prescription medicines only as told by your health care provider. Ask if you can take sinus medicine if your sinuses are blocked. Do not use any products that contain nicotine or tobacco. These products include cigarettes, chewing tobacco, and vaping devices, such as e-cigarettes. If you need help quitting, ask your health care provider. Keep all follow-up visits. This is important. Contact a health care provider if: You have redness or pressure sores on your head, face, mouth, or nose from the mask or head gear. You have trouble using the CPAP or BIPAP machine. You cannot tolerate wearing the CPAP or BIPAP mask. Someone tells you that you snore even when wearing your CPAP or BIPAP. Get help right away if: You have trouble breathing. You feel confused. Summary CPAP and BIPAP are methods that use air pressure  to keep your airways open and to help you breathe well. If you have trouble with the mask, it is very important that you talk with your health care provider about finding a way to make the mask easier to tolerate. Do not stop using the mask. There could be a negative impact to your health if you stop using the mask. Follow instructions from your health care provider about when to use the machine. This information is not intended to replace advice given to you by your health care provider. Make sure you discuss any questions you have with your health care provider. Document Revised: 03/22/2021 Document Reviewed: 07/22/2020 Elsevier Patient Education  2023 [employer].

## 2023-04-27 NOTE — Assessment & Plan Note (Signed)
-   Severe OSA well controlled on current CPAP pressure settings. Patient reports 100% compliance with CPAP use. Download showed patient wore CPAP 17/30 days > 4 hours last 30 days. She has a travel CPAP that she on days that are not recorded. Current pressure 7cm h20; Residual AHI 0.4/hour. No changes recommended. Renew CPAP supplies. She will check with DME about getting a different sized nasal mask.  Patient continue to wear CPAP nightly for 4 to 6 hours or longer.  FU in 1 year or sooner if needed.

## 2023-05-06 ENCOUNTER — Other Ambulatory Visit (HOSPITAL_COMMUNITY): Payer: Self-pay

## 2023-05-06 IMAGING — DX DG CHEST 2V
2 series · 2 of 2 positions shown · non-contrast
Comparison: 11/25/2017

CLINICAL DATA: Morbid obesity. Pre-op evaluation for bariatric
surgery.

EXAM:
CHEST - 2 VIEW

[chest pa]
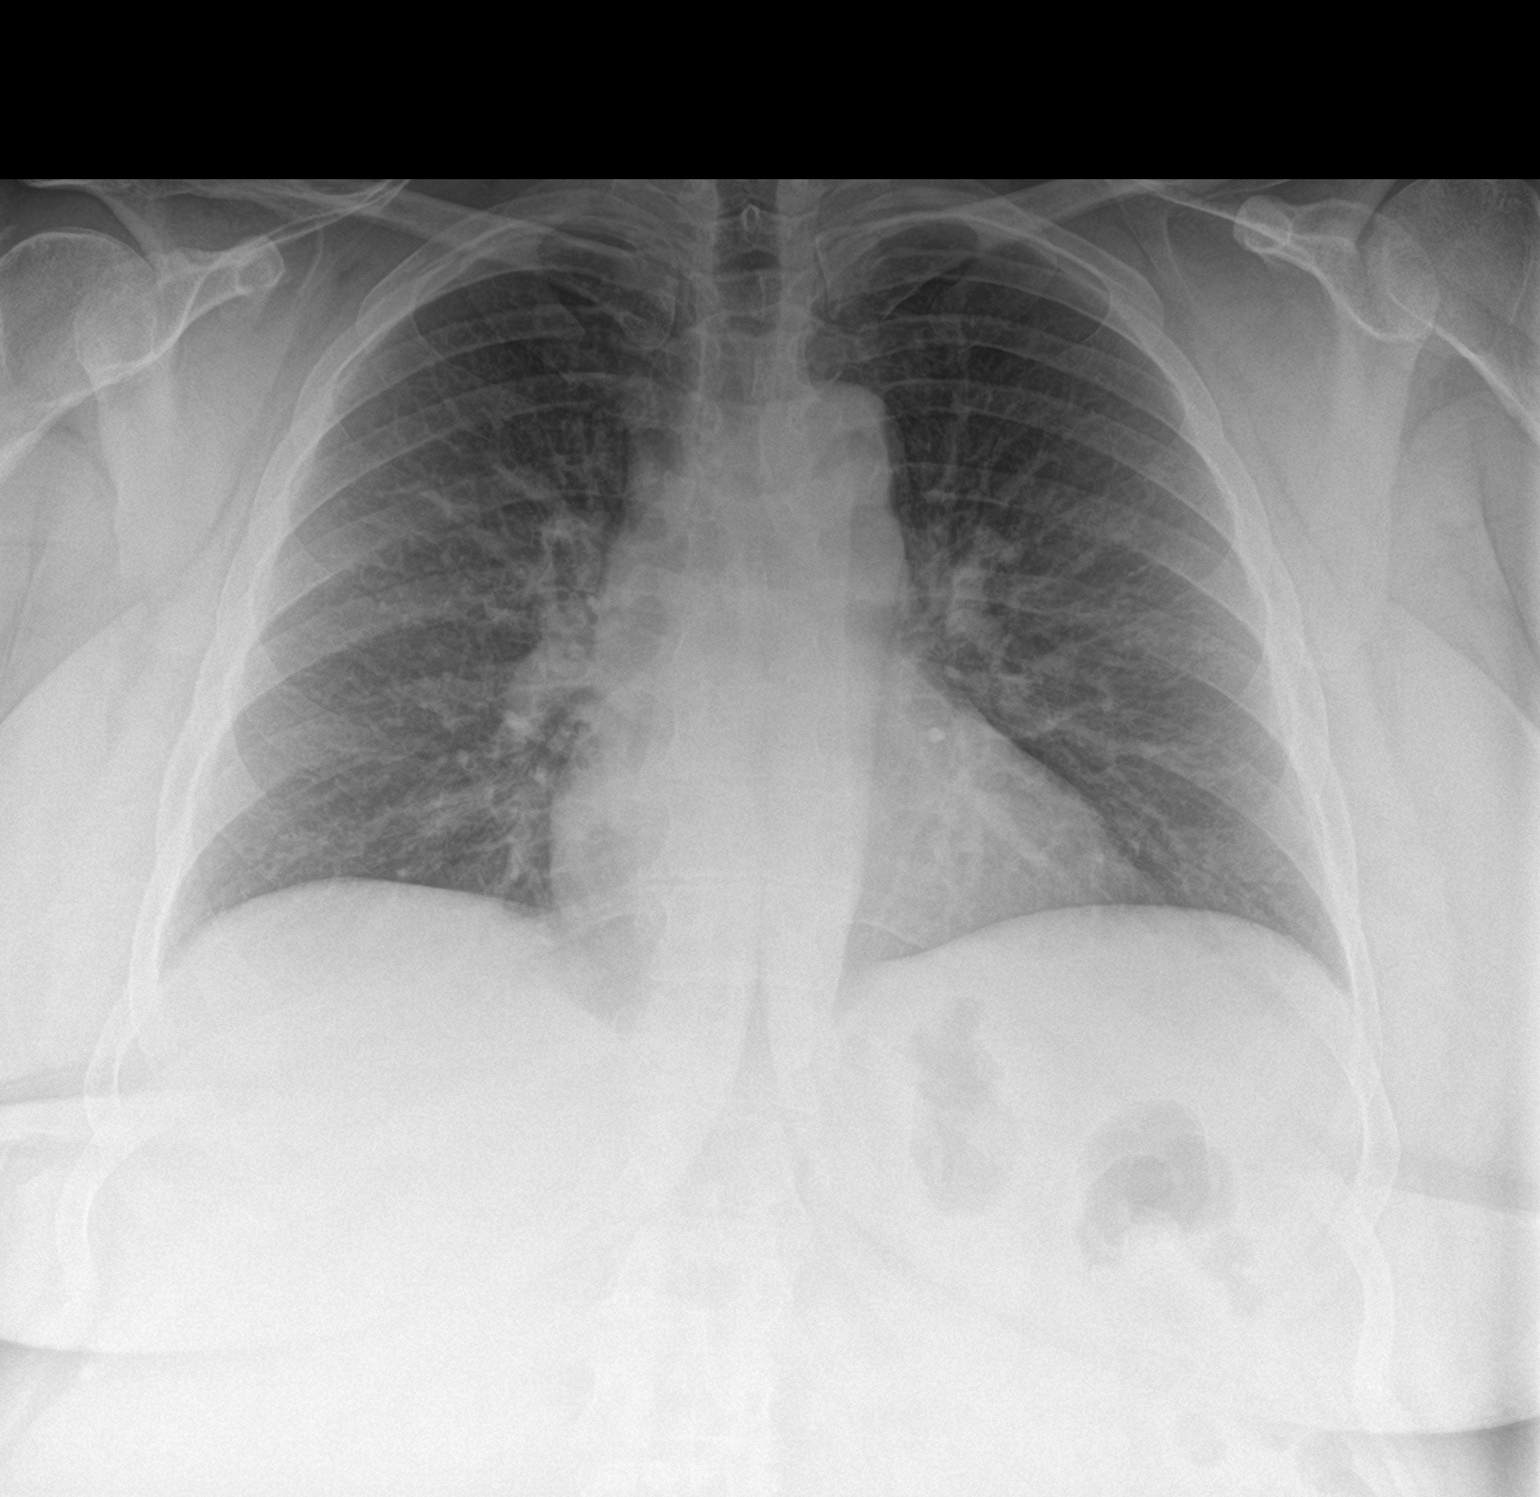

[chest lat]
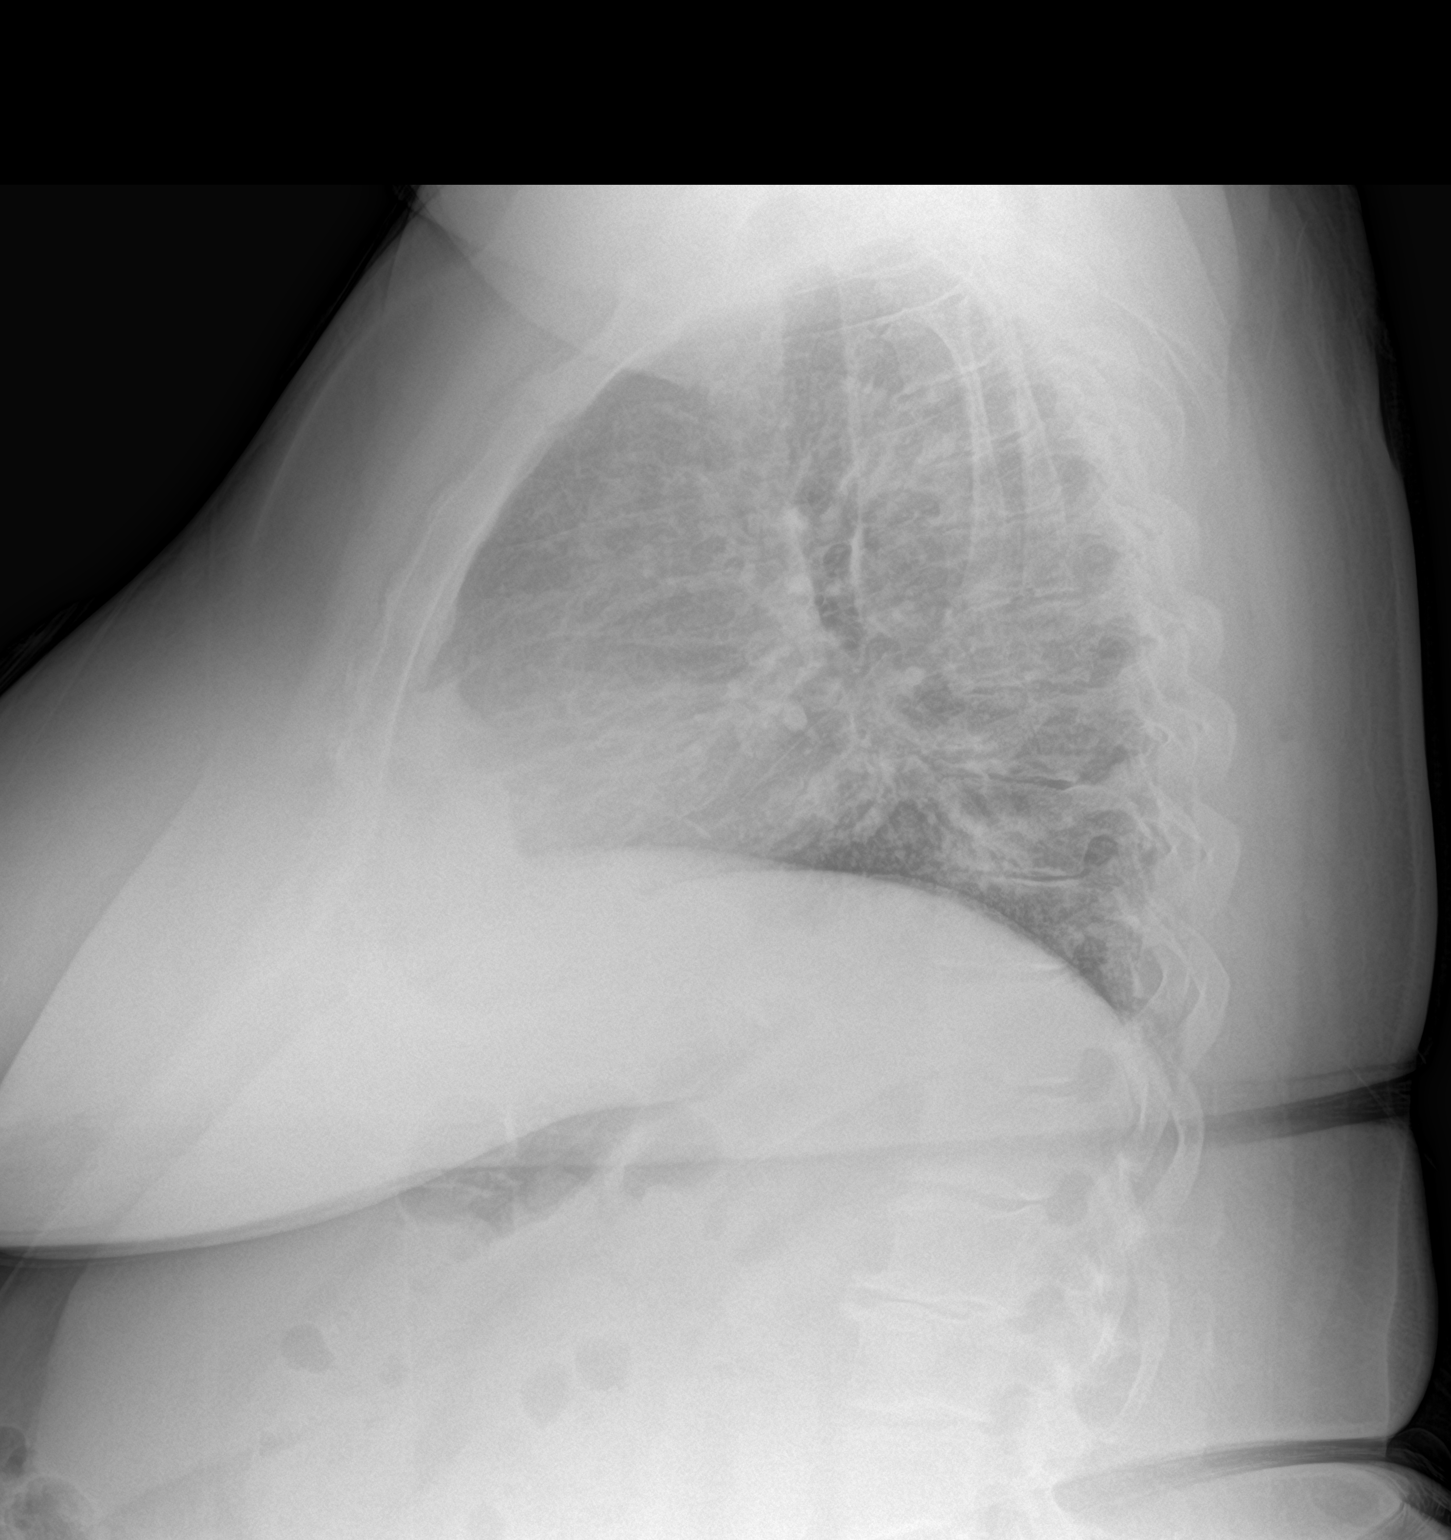

[2 of 2 positions shown; findings below may reference images not displayed]

FINDINGS: The heart size and mediastinal contours are within normal limits.
Mild aortic atherosclerotic calcification noted. both lungs are
clear. The visualized skeletal structures are unremarkable.
IMPRESSION: No active cardiopulmonary disease.

## 2023-05-08 ENCOUNTER — Encounter: Payer: Self-pay | Admitting: Skilled Nursing Facility1

## 2023-05-08 ENCOUNTER — Ambulatory Visit: Payer: 59 | Admitting: Skilled Nursing Facility1

## 2023-05-08 ENCOUNTER — Encounter: Payer: 59 | Attending: Surgery | Admitting: Skilled Nursing Facility1

## 2023-05-08 DIAGNOSIS — G4733 Obstructive sleep apnea (adult) (pediatric): Secondary | ICD-10-CM | POA: Diagnosis not present

## 2023-05-08 DIAGNOSIS — E119 Type 2 diabetes mellitus without complications: Secondary | ICD-10-CM | POA: Insufficient documentation

## 2023-05-08 DIAGNOSIS — Z9884 Bariatric surgery status: Secondary | ICD-10-CM | POA: Diagnosis not present

## 2023-05-08 NOTE — Progress Notes (Unsigned)
Bariatric Nutrition Follow-Up Visit Medical Nutrition Therapy    NUTRITION ASSESSMENT    Anthropometrics  Surgery date: 11/21/2021 Surgery type: RYGB Start weight at [facility]: 251.4 Weight today: 163.6 pounds   Body Composition Scale 01/09/2022 04/16/2022 09/05/2022 05/08/2023  Current Body Weight 218 186.1 174.3 163.6  Total Body Fat % 46.4 42.6 40.8 39.2  Visceral Fat 18 15 13 12   Fat-Free Mass % 53.5 57.3 59.1 60.7   Total Body Water % 41.2 43.1 44 44.8  Muscle-Mass lbs 26.5 26 25.9 25.6  BMI 44.0 37.6 35.1 33  Body Fat Displacement             Torso  lbs 62.6 49 43.9 39.5         Left Leg  lbs 12.5 9.8 8.7 7.9         Right Leg  lbs 12.5 9.8 8.7 7.9         Left Arm  lbs 6.2 4.9 4.3 3.9         Right Arm   lbs 6.2 4.9 4.3 3.9   Clinical  Medical hx: DM, hyperlipemia, discoid lupus Medications: ozempic, metformin  Labs: A1C 5.6 Notable signs/symptoms: N/A Any previous deficiencies? Vitamin D, Low iron   Lifestyle & Dietary Hx  Pt state she is about 1.5 years out and just wants to ensure she stays on track.  Pt states she does an IV cocktail every month. Pt states she wants to get to 150 pounds.  Pt states she goes on 4 mile walks.   Estimated daily fluid intake: 32+ oz Estimated daily protein intake: 40-60 g Supplements: multi, calcium Current average weekly physical activity: walking a mile or two four times a week, palates an hour once a week.  Pt states piliates is great: walking and kyaking and now golfing   24-Hr Dietary Recall: 1-2 pieces of fruit daily First Meal: balanced smoothie Snack:  banana Second Meal: broccoli and cheese soup Snack:   Third Meal: chicken salad and crackers Snack:  Beverages: water, gatorade zero, water flavorings  Post-Op Goals/ Signs/ Symptoms Using straws: no Drinking while eating: no Chewing/swallowing difficulties: no Changes in vision: no Changes to mood/headaches: no Hair loss/changes to skin/nails: no Difficulty  focusing/concentrating: no Sweating: no Limb weakness: no Dizziness/lightheadedness: no Palpitations: no  Carbonated/caffeinated beverages: no N/V/D/C/Gas: no Abdominal pain: no Dumping syndrome: no    NUTRITION DIAGNOSIS  Overweight/obesity ([place]-3.3) related to past poor dietary habits and physical inactivity as evidenced by completed bariatric surgery and following dietary guidelines for continued weight loss and healthy nutrition status.     NUTRITION INTERVENTION Nutrition counseling (C-1) and education (E-2) to facilitate bariatric surgery goals, including:  The importance of consuming adequate calories as well as certain nutrients daily due to the body's need for essential vitamins, minerals, and fats The importance of daily physical activity and to reach a goal of at least 150 minutes of moderate to vigorous physical activity weekly (or as directed by their physician) due to benefits such as increased musculature and improved lab values The importance of intuitive eating specifically learning hunger-satiety cues and understanding the importance of learning a new body: The importance of mindful eating to avoid grazing behaviors  Encouraged patient to honor their body's internal hunger and fullness cues.  Throughout the day, check in mentally and rate hunger. Stop eating when satisfied not full regardless of how much food is left on the plate.  Get more if still hungry 20-30 minutes later.  The key is  to honor satisfaction so throughout the meal, rate fullness factor and stop when comfortably satisfied not physically full. The key is to honor hunger and fullness without any feelings of guilt or shame.  Pay attention to what the internal cues are, rather than any external factors. This will enhance the confidence you have in listening to your own body and following those internal cues enabling you to increase how often you eat when you are hungry not out of appetite and stop when you are  satisfied not full.  Encouraged pt to continue to eat balanced meals inclusive of non starchy vegetables 2 times a day 7 days a week Encouraged pt to choose lean protein sources: limiting beef, pork, sausage, hotdogs, and lunch meat Encourage pt to choose healthy fats such as plant based limiting animal fats Encouraged pt to continue to drink a minium 64 fluid ounces with half being plain water to satisfy proper hydration    Learning Style & Readiness for Change Teaching method utilized: Visual & Auditory  Demonstrated degree of understanding via: Teach Back  Readiness Level: action Barriers to learning/adherence to lifestyle change: none identified   RD's Notes for Next Visit Assess adherence to pt chosen goals    MONITORING & EVALUATION Dietary intake, weekly physical activity, body weight  Next Steps Patient is to follow-up in 6 months

## 2023-05-17 DIAGNOSIS — G4733 Obstructive sleep apnea (adult) (pediatric): Secondary | ICD-10-CM | POA: Diagnosis not present

## 2023-06-03 ENCOUNTER — Other Ambulatory Visit: Payer: Self-pay

## 2023-06-03 ENCOUNTER — Other Ambulatory Visit (HOSPITAL_COMMUNITY): Payer: Self-pay

## 2023-06-03 ENCOUNTER — Other Ambulatory Visit: Payer: Self-pay | Admitting: Internal Medicine

## 2023-06-03 MED ORDER — ATORVASTATIN CALCIUM 20 MG PO TABS
20.0000 mg | ORAL_TABLET | Freq: Every day | ORAL | 3 refills | Status: DC
  Filled 2023-06-03: qty 90, 90d supply, fill #0
  Filled 2023-07-11 – 2023-08-29 (×3): qty 90, 90d supply, fill #1
  Filled 2023-12-11: qty 90, 90d supply, fill #2
  Filled 2024-03-02: qty 90, 90d supply, fill #3

## 2023-06-16 DIAGNOSIS — G4733 Obstructive sleep apnea (adult) (pediatric): Secondary | ICD-10-CM | POA: Diagnosis not present

## 2023-06-20 ENCOUNTER — Other Ambulatory Visit: Payer: 59

## 2023-06-25 ENCOUNTER — Encounter: Payer: 59 | Admitting: Internal Medicine

## 2023-06-27 ENCOUNTER — Encounter (HOSPITAL_COMMUNITY): Payer: Self-pay | Admitting: *Deleted

## 2023-07-03 ENCOUNTER — Encounter: Payer: Self-pay | Admitting: Pharmacist

## 2023-07-03 ENCOUNTER — Other Ambulatory Visit: Payer: Self-pay

## 2023-07-04 ENCOUNTER — Other Ambulatory Visit: Payer: Self-pay

## 2023-07-11 ENCOUNTER — Encounter: Payer: Self-pay | Admitting: Pharmacist

## 2023-07-11 ENCOUNTER — Other Ambulatory Visit (HOSPITAL_COMMUNITY): Payer: Self-pay

## 2023-07-11 ENCOUNTER — Other Ambulatory Visit: Payer: Self-pay

## 2023-07-12 ENCOUNTER — Other Ambulatory Visit: Payer: Self-pay

## 2023-07-14 ENCOUNTER — Other Ambulatory Visit: Payer: Self-pay | Admitting: Internal Medicine

## 2023-07-14 DIAGNOSIS — J00 Acute nasopharyngitis [common cold]: Secondary | ICD-10-CM

## 2023-07-15 ENCOUNTER — Other Ambulatory Visit (HOSPITAL_COMMUNITY): Payer: Self-pay

## 2023-07-15 ENCOUNTER — Other Ambulatory Visit: Payer: Self-pay

## 2023-07-15 MED ORDER — ROPINIROLE HCL 0.5 MG PO TABS
ORAL_TABLET | ORAL | 1 refills | Status: DC
  Filled 2023-07-15 – 2023-08-29 (×2): qty 270, 90d supply, fill #0
  Filled 2023-12-11: qty 270, 90d supply, fill #1

## 2023-07-15 MED ORDER — FLUTICASONE PROPIONATE 50 MCG/ACT NA SUSP
2.0000 | Freq: Every day | NASAL | 99 refills | Status: DC
  Filled 2023-07-15 – 2023-07-22 (×2): qty 16, 30d supply, fill #0
  Filled 2023-08-29: qty 16, 30d supply, fill #1
  Filled 2023-12-11: qty 48, 90d supply, fill #2
  Filled 2024-03-02: qty 48, 90d supply, fill #3
  Filled 2024-06-01: qty 48, 90d supply, fill #4

## 2023-07-15 NOTE — Progress Notes (Signed)
Patient Care Team: Margaree Mackintosh, MD as PCP - General (Internal Medicine) Wendall Stade, MD as PCP - Cardiology (Cardiology)  Visit Date: 07/22/23  Subjective:    Patient ID: Connie Hall , Female   DOB: 1966/07/28, 57 y.o.    MRN: [id number]   57 y.o. Female presents today for annual comprehensive physical exam. History of back pain, deviated nasal septum, discoid lupus, Type 2 diabetes mellitus, hyperlipidemia, leg edema, obesity, obstructive sleep apnea, restless legs syndrome, Vitamin D deficiency.  She looks and feels well.  She had robotic Roux-en-Y gastric bypass surgery with hiatal hernia repair in March 2023.  Had liver injury during surgery and JP drain was placed which was removed December 13, 2021.  Preoperative weight was 243 pounds and is now 163. She is having a BM every 3-4 days. She has been taking one capful of Miralax for two days.  History of Type 2 diabetes mellitus treated with Ozempic 2 mg weekly. HGBA1c at 5.5%.  History of hyperlipidemia treated with atorvastatin 20 mg daily. Lipid panel normal.  History of restless legs syndrome treated with Requip 0.5 mg in the afternoon and 1 mg at bedtime.  She saw Dr. Truett Perna in 2018 to discuss genetics with family history of pancreatic cancer.  Genetic screening was negative.  Father and paternal uncle died of pancreatic cancer.  Sister was diagnosed with a neuroendocrine tumor at the tail of the pancreas in 2017.   Patient is allergic to Sulfa-it causes hives  Seen by Dr. Vassie Loll for sleep apnea in September 2023.  CPAP is set at 9 cm.  Home sleep service is by [facility].  Reports she is UTD on her eye exam.  07/18/23 labs reviewed today. Glucose normal. Kidney functions normal. Electrolytes normal. Blood proteins normal. ALT elevated at 33. CBC normal. TSH at 1.68.  Pap smear last completed 02/07/16.  02/08/23 mammogram showed calcifications in right breast. Follow-up right mammogram showed indeterminate calcifications  in upper-outer quadrant of the right breast spanning 5 mm. Needle core biopsy showed benign breast tissue with sclerosing adenosis and calcifications.  Had colonoscopy and endoscopy by Dr. Meridee Score in November 2022. Upper endoscopy was for evaluation prior to bariatric surgery and colonoscopy was for colon cancer screening with history of colon cancer in family. 6 polyps were removed on colonoscopy. Pathology showed tubular adenoma, 2 fragments. 1 hyperplastic polyp. On endoscopy had acutely inflamed squamocolumnar mucosa with focal ulceration without intestinal metaplasia, dysplasia or malignancy. H. pylori stain was negative.   Social history: Single, never married.  Seldom consumes alcohol.  Does not smoke.  Has a college degree.  Has family in [place].  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  She also serves on several boards.  She likes her job.   Family history: Father with history of hypertension, stroke, coronary artery disease, kidney stones and diabetes.  Mother with history of osteoporosis.  No brothers.  4 sisters, 2 of whom have diabetes.  2 sisters have sleep apnea.  3 sisters are overweight.  1 sister is overall in good health.  Past Medical History:  Diagnosis Date   Back pain    Complication of anesthesia    low O2 sat   Deviated nasal septum    Discoid lupus    DM (diabetes mellitus) (HCC)    Family history of pancreatic cancer    Family history of prostate cancer    History of discoid lupus erythematosus  Hyperlipidemia    Leg edema    Obesity    OSA (obstructive sleep apnea)    Restless legs syndrome (RLS)    Vitamin D deficiency      Family History  Problem Relation Age of Onset   Skin cancer Mother    Hyperlipidemia Mother    Hypertension Father    Stroke Father    Heart disease Father    Diabetes Father    Pancreatic cancer Father 53   Prostate cancer Father        dx in his 87s; prostatectomy    Sleep apnea Father    Obesity Father    Pancreatic cancer Sister 56       neuroendocrine tumor   Colon polyps Sister    Colon polyps Sister    Colon polyps Sister    Congestive Heart Failure Maternal Aunt    Dementia Maternal Uncle    Lung cancer Paternal Aunt    Heart attack Paternal Uncle    Pancreatic cancer Paternal Uncle 30   Testicular cancer Cousin        paternal first cousin dx in his 54s   Colon cancer Neg Hx    Esophageal cancer Neg Hx    Inflammatory bowel disease Neg Hx    Liver disease Neg Hx    Stomach cancer Neg Hx    Rectal cancer Neg Hx     Social Hx: Single, never married.  Does not smoke.  Seldom consumes alcohol.  Has a college degree.  Has family in [place].  She is an Psychologist, educational in charge of [employer] located within [employer]     Review of Systems  Constitutional:  Negative for chills, fever, malaise/fatigue and weight loss.  HENT:  Negative for hearing loss, sinus pain and sore throat.   Respiratory:  Negative for cough, hemoptysis and shortness of breath.   Cardiovascular:  Negative for chest pain, palpitations, leg swelling and PND.  Gastrointestinal:  Positive for constipation. Negative for abdominal pain, diarrhea, heartburn, nausea and vomiting.  Genitourinary:  Negative for dysuria, frequency and urgency.  Musculoskeletal:  Negative for back pain, myalgias and neck pain.  Skin:  Negative for itching and rash.  Neurological:  Negative for dizziness, tingling, seizures and headaches.  Endo/Heme/Allergies:  Negative for polydipsia.  Psychiatric/Behavioral:  Negative for depression. The patient is not nervous/anxious.         Objective:   Vitals: BP 102/78   Pulse 72   Ht 4\' 11"  (1.499 m)   Wt 163 lb (73.9 kg)   LMP 02/08/2021 (Approximate)   SpO2 98%   BMI 32.92 kg/m    Physical Exam Vitals and nursing note reviewed.  Constitutional:      General: She is not in acute distress.    Appearance: Normal  appearance. She is not ill-appearing or toxic-appearing.  HENT:     Head: Normocephalic and atraumatic.     Right Ear: Hearing, tympanic membrane, ear canal and external ear normal.     Left Ear: Hearing, tympanic membrane, ear canal and external ear normal.     Mouth/Throat:     Pharynx: Oropharynx is clear.  Eyes:     Extraocular Movements: Extraocular movements intact.     Pupils: Pupils are equal, round, and reactive to light.  Neck:     Thyroid: No thyroid mass, thyromegaly or thyroid tenderness.     Vascular: No carotid bruit.  Cardiovascular:     Rate and Rhythm: Normal rate and  regular rhythm. No extrasystoles are present.    Heart sounds: Normal heart sounds. No murmur heard.    No friction rub. No gallop.     Comments: DP pulses normal. Pulmonary:     Effort: Pulmonary effort is normal.     Breath sounds: Normal breath sounds. No decreased breath sounds, wheezing, rhonchi or rales.  Chest:     Chest wall: No mass.  Breasts:    Right: No mass.     Left: No mass.  Abdominal:     Palpations: Abdomen is soft. There is no hepatomegaly, splenomegaly or mass.     Tenderness: There is no abdominal tenderness.     Hernia: No hernia is present.  Genitourinary:    Comments: Bimanual exam normal. Musculoskeletal:     Cervical back: Normal range of motion.     Right lower leg: No edema.     Left lower leg: No edema.  Feet:     Comments: No lesions. Lymphadenopathy:     Cervical: No cervical adenopathy.     Upper Body:     Right upper body: No supraclavicular adenopathy.     Left upper body: No supraclavicular adenopathy.  Skin:    General: Skin is warm and dry.  Neurological:     General: No focal deficit present.     Mental Status: She is alert and oriented to person, place, and time. Mental status is at baseline.     Sensory: Sensation is intact.     Motor: Motor function is intact. No weakness.     Deep Tendon Reflexes: Reflexes are normal and symmetric.   Psychiatric:        Attention and Perception: Attention normal.        Mood and Affect: Mood normal.        Speech: Speech normal.        Behavior: Behavior normal.        Thought Content: Thought content normal.        Cognition and Memory: Cognition normal.        Judgment: Judgment normal.       Results:   Studies obtained and personally reviewed by me:  Pap smear last completed 02/07/16.  02/08/23 mammogram showed calcifications in right breast. Follow-up right mammogram showed indeterminate calcifications in upper-outer quadrant of the right breast spanning 5 mm. Needle core biopsy showed benign breast tissue with sclerosing adenosis and calcifications.  Had colonoscopy and endoscopy by Dr. Meridee Score in November 2022. Upper endoscopy was for evaluation prior to bariatric surgery and colonoscopy was for colon cancer screening with history of colon cancer in family. 6 polyps were removed on colonoscopy. Pathology showed tubular adenoma, 2 fragments. 1 hyperplastic polyp. On endoscopy had acutely inflamed squamocolumnar mucosa with focal ulceration without intestinal metaplasia, dysplasia or malignancy. H. pylori stain was negative.   Labs:       Component Value Date/Time   NA 143 07/18/2023 0944   NA 140 12/25/2017 0923   K 4.8 07/18/2023 0944   CL 106 07/18/2023 0944   CO2 28 07/18/2023 0944   GLUCOSE 81 07/18/2023 0944   BUN 11 07/18/2023 0944   BUN 14 12/25/2017 0923   CREATININE 0.66 07/18/2023 0944   CALCIUM 9.5 07/18/2023 0944   PROT 6.5 07/18/2023 0944   PROT 7.0 12/25/2017 0923   ALBUMIN 4.4 11/16/2021 1303   ALBUMIN 4.0 12/25/2017 0923   AST 25 07/18/2023 0944   ALT 33 (H) 07/18/2023 0944   ALKPHOS 50 11/16/2021  1303   BILITOT 0.5 07/18/2023 0944   BILITOT 0.3 12/25/2017 0923   GFRNONAA >60 11/21/2021 1409   GFRNONAA 78 05/13/2020 0914   GFRAA 90 05/13/2020 0914     Lab Results  Component Value Date   WBC 5.8 07/18/2023   HGB 12.7 07/18/2023   HCT  38.2 07/18/2023   MCV 91.4 07/18/2023   PLT 265 07/18/2023    Lab Results  Component Value Date   CHOL 117 07/18/2023   HDL 50 07/18/2023   LDLCALC 51 07/18/2023   TRIG 79 07/18/2023   CHOLHDL 2.3 07/18/2023    Lab Results  Component Value Date   HGBA1C 5.5 07/18/2023     Lab Results  Component Value Date   TSH 1.68 07/18/2023      Assessment & Plan:   Constipation: can continue taking Miralax. Recommended Dulcolax.  Type 2 diabetes mellitus: treated with Ozempic 2 mg weekly. HGBA1c at 5.5%.  Hyperlipidemia: treated with atorvastatin 20 mg daily. Lipid panel normal.  Restless legs syndrome: treated with Requip 0.5 mg in the afternoon and 1 mg at bedtime.  Sleep apnea: treated with CPAP and seen by Dr. Vassie Loll.  Reports she is UTD on her eye exam. We will get the date for this.  Urinalysis normal today.  Pap smear last completed 02/07/16. Pelvic exam deferred today at patient's request.  02/08/23 mammogram showed calcifications in right breast. Follow-up right mammogram showed indeterminate calcifications in upper-outer quadrant of the right breast spanning 5 mm. Needle core biopsy showed benign breast tissue with sclerosing adenosis and calcifications.  Had colonoscopy and endoscopy by Dr. Meridee Score in November 2022. Upper endoscopy was for evaluation prior to bariatric surgery and colonoscopy was for colon cancer screening with history of colon cancer in family. 6 polyps were removed on colonoscopy. Pathology showed tubular adenoma, 2 fragments. 1 hyperplastic polyp. On endoscopy had acutely inflamed squamocolumnar mucosa with focal ulceration without intestinal metaplasia, dysplasia or malignancy. H. pylori stain was negative.   Vaccine counseling: UTD on tetanus, shingles vaccines. Administered pneumococcal 20 vaccine today.  Return in 12 months or as needed.    I,Alexander Ruley,acting as a Neurosurgeon for Margaree Mackintosh, MD.,have documented all relevant documentation on  the behalf of Margaree Mackintosh, MD,as directed by  Margaree Mackintosh, MD while in the presence of Margaree Mackintosh, MD.   I, Margaree Mackintosh, MD, have reviewed all documentation for this visit. The documentation on 07/27/23 for the exam, diagnosis, procedures, and orders are all accurate and complete.

## 2023-07-18 ENCOUNTER — Other Ambulatory Visit: Payer: 59

## 2023-07-18 DIAGNOSIS — E785 Hyperlipidemia, unspecified: Secondary | ICD-10-CM

## 2023-07-18 DIAGNOSIS — Z0000 Encounter for general adult medical examination without abnormal findings: Secondary | ICD-10-CM

## 2023-07-18 DIAGNOSIS — G2581 Restless legs syndrome: Secondary | ICD-10-CM

## 2023-07-18 DIAGNOSIS — Z800 Family history of malignant neoplasm of digestive organs: Secondary | ICD-10-CM

## 2023-07-18 DIAGNOSIS — Z6835 Body mass index (BMI) 35.0-35.9, adult: Secondary | ICD-10-CM | POA: Diagnosis not present

## 2023-07-18 DIAGNOSIS — K219 Gastro-esophageal reflux disease without esophagitis: Secondary | ICD-10-CM | POA: Diagnosis not present

## 2023-07-18 DIAGNOSIS — E119 Type 2 diabetes mellitus without complications: Secondary | ICD-10-CM | POA: Diagnosis not present

## 2023-07-19 LAB — CBC WITH DIFFERENTIAL/PLATELET
Absolute Lymphocytes: 1821 {cells}/uL (ref 850–3900)
Absolute Monocytes: 249 {cells}/uL (ref 200–950)
Basophils Absolute: 52 {cells}/uL (ref 0–200)
Basophils Relative: 0.9 %
Eosinophils Absolute: 81 {cells}/uL (ref 15–500)
Eosinophils Relative: 1.4 %
HCT: 38.2 % (ref 35.0–45.0)
Hemoglobin: 12.7 g/dL (ref 11.7–15.5)
MCH: 30.4 pg (ref 27.0–33.0)
MCHC: 33.2 g/dL (ref 32.0–36.0)
MCV: 91.4 fL (ref 80.0–100.0)
MPV: 10.1 fL (ref 7.5–12.5)
Monocytes Relative: 4.3 %
Neutro Abs: 3596 {cells}/uL (ref 1500–7800)
Neutrophils Relative %: 62 %
Platelets: 265 10*3/uL (ref 140–400)
RBC: 4.18 10*6/uL (ref 3.80–5.10)
RDW: 12 % (ref 11.0–15.0)
Total Lymphocyte: 31.4 %
WBC: 5.8 10*3/uL (ref 3.8–10.8)

## 2023-07-19 LAB — COMPLETE METABOLIC PANEL WITH GFR
AG Ratio: 2 (calc) (ref 1.0–2.5)
ALT: 33 U/L — ABNORMAL HIGH (ref 6–29)
AST: 25 U/L (ref 10–35)
Albumin: 4.3 g/dL (ref 3.6–5.1)
Alkaline phosphatase (APISO): 48 U/L (ref 37–153)
BUN: 11 mg/dL (ref 7–25)
CO2: 28 mmol/L (ref 20–32)
Calcium: 9.5 mg/dL (ref 8.6–10.4)
Chloride: 106 mmol/L (ref 98–110)
Creat: 0.66 mg/dL (ref 0.50–1.03)
Globulin: 2.2 g/dL (ref 1.9–3.7)
Glucose, Bld: 81 mg/dL (ref 65–99)
Potassium: 4.8 mmol/L (ref 3.5–5.3)
Sodium: 143 mmol/L (ref 135–146)
Total Bilirubin: 0.5 mg/dL (ref 0.2–1.2)
Total Protein: 6.5 g/dL (ref 6.1–8.1)
eGFR: 104 mL/min/{1.73_m2} (ref >=60)

## 2023-07-19 LAB — LIPID PANEL
Cholesterol: 117 mg/dL (ref <200)
HDL: 50 mg/dL (ref >=50)
LDL Cholesterol (Calc): 51 mg/dL
Non-HDL Cholesterol (Calc): 67 mg/dL (ref <130)
Total CHOL/HDL Ratio: 2.3 (calc) (ref <5.0)
Triglycerides: 79 mg/dL (ref <150)

## 2023-07-19 LAB — HEMOGLOBIN A1C
Hgb A1c MFr Bld: 5.5 %{Hb} (ref <5.7)
Mean Plasma Glucose: 111 mg/dL
eAG (mmol/L): 6.2 mmol/L

## 2023-07-19 LAB — TSH: TSH: 1.68 m[IU]/L

## 2023-07-22 ENCOUNTER — Other Ambulatory Visit: Payer: Self-pay

## 2023-07-22 ENCOUNTER — Ambulatory Visit: Payer: 59 | Admitting: Internal Medicine

## 2023-07-22 ENCOUNTER — Encounter: Payer: Self-pay | Admitting: Internal Medicine

## 2023-07-22 VITALS — BP 102/78 | HR 72 | Ht 59.0 in | Wt 163.0 lb

## 2023-07-22 DIAGNOSIS — K5901 Slow transit constipation: Secondary | ICD-10-CM | POA: Diagnosis not present

## 2023-07-22 DIAGNOSIS — E785 Hyperlipidemia, unspecified: Secondary | ICD-10-CM

## 2023-07-22 DIAGNOSIS — E119 Type 2 diabetes mellitus without complications: Secondary | ICD-10-CM | POA: Diagnosis not present

## 2023-07-22 DIAGNOSIS — R931 Abnormal findings on diagnostic imaging of heart and coronary circulation: Secondary | ICD-10-CM | POA: Diagnosis not present

## 2023-07-22 DIAGNOSIS — Z23 Encounter for immunization: Secondary | ICD-10-CM

## 2023-07-22 DIAGNOSIS — Z0000 Encounter for general adult medical examination without abnormal findings: Secondary | ICD-10-CM

## 2023-07-22 DIAGNOSIS — Z9889 Other specified postprocedural states: Secondary | ICD-10-CM | POA: Diagnosis not present

## 2023-07-22 DIAGNOSIS — G2581 Restless legs syndrome: Secondary | ICD-10-CM | POA: Diagnosis not present

## 2023-07-22 LAB — POCT URINALYSIS DIP (CLINITEK)
Bilirubin, UA: NEGATIVE
Blood, UA: NEGATIVE
Glucose, UA: NEGATIVE mg/dL
Ketones, POC UA: NEGATIVE mg/dL
Leukocytes, UA: NEGATIVE
Nitrite, UA: NEGATIVE
POC PROTEIN,UA: NEGATIVE
Spec Grav, UA: 1.01
Urobilinogen, UA: 0.2 U/dL
pH, UA: 6.5

## 2023-07-27 NOTE — Patient Instructions (Addendum)
As always, it was a pleasure to see you today.  May take Dulcolax sparingly if needed for constipation and continue taking MiraLAX daily.  Labs are stable.  Pneumococcal 20 vaccine given today.  Return in 1 year or as needed.  Continue current medications.

## 2023-08-05 IMAGING — MG MM DIGITAL SCREENING BILAT W/ TOMO AND CAD
8 of 14 series · 8 of 40 positions shown · non-contrast
Comparison: Previous exam(s).

CLINICAL DATA: Screening.

EXAM:
DIGITAL SCREENING BILATERAL MAMMOGRAM WITH TOMOSYNTHESIS AND CAD
TECHNIQUE: Bilateral screening digital craniocaudal and mediolateral oblique
mammograms were obtained. Bilateral screening digital breast
tomosynthesis was performed. The images were evaluated with
computer-aided detection.

[L MLO synth-2D (1 of 2)]
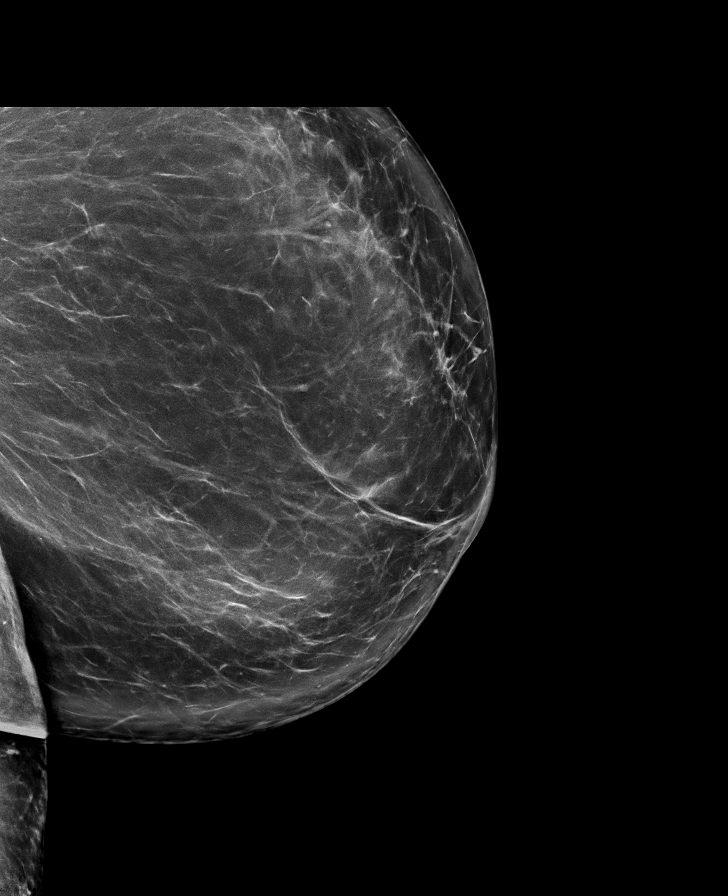

[L CC synth-2D]
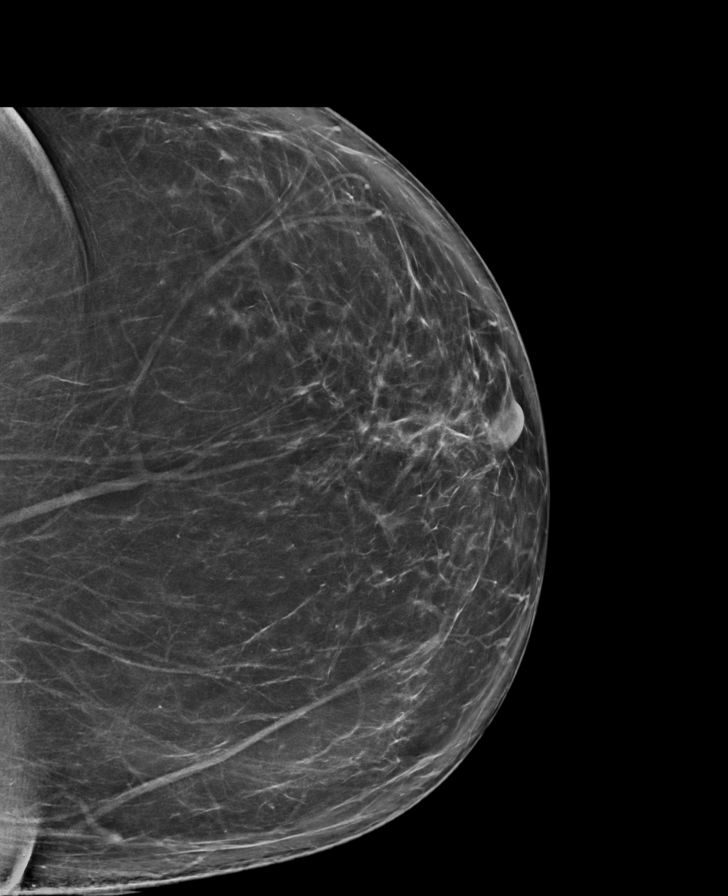

[L CV synth-2D]
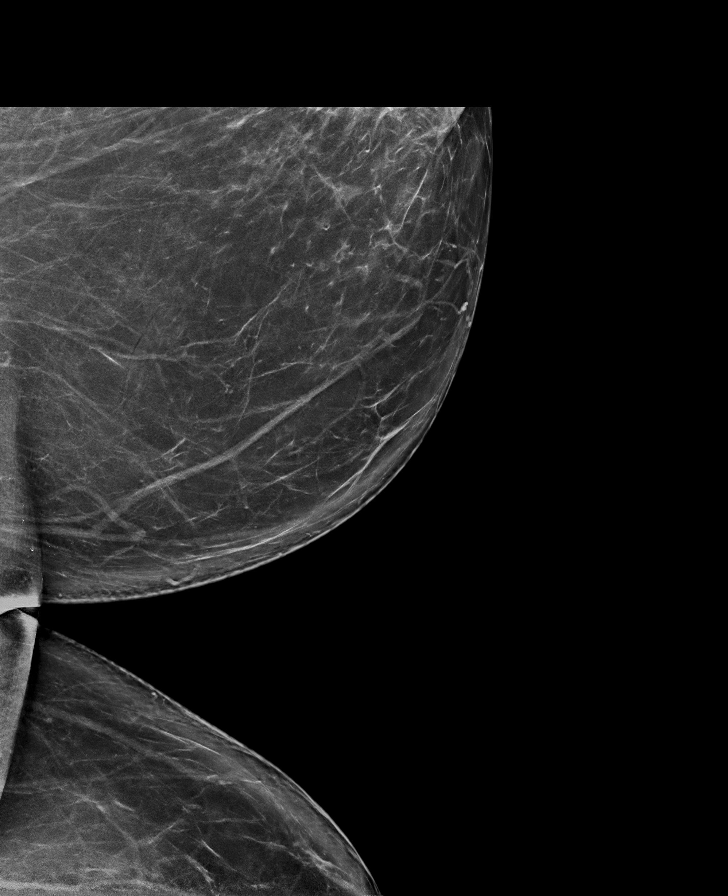

[R MLO synth-2D (1 of 2)]
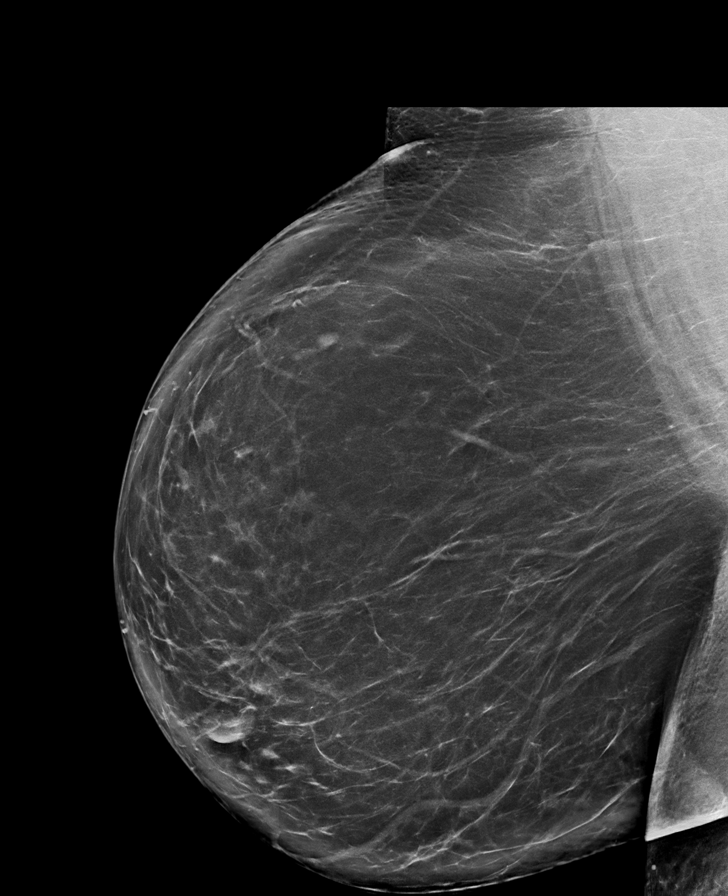

[L MLO synth-2D (2 of 2)]
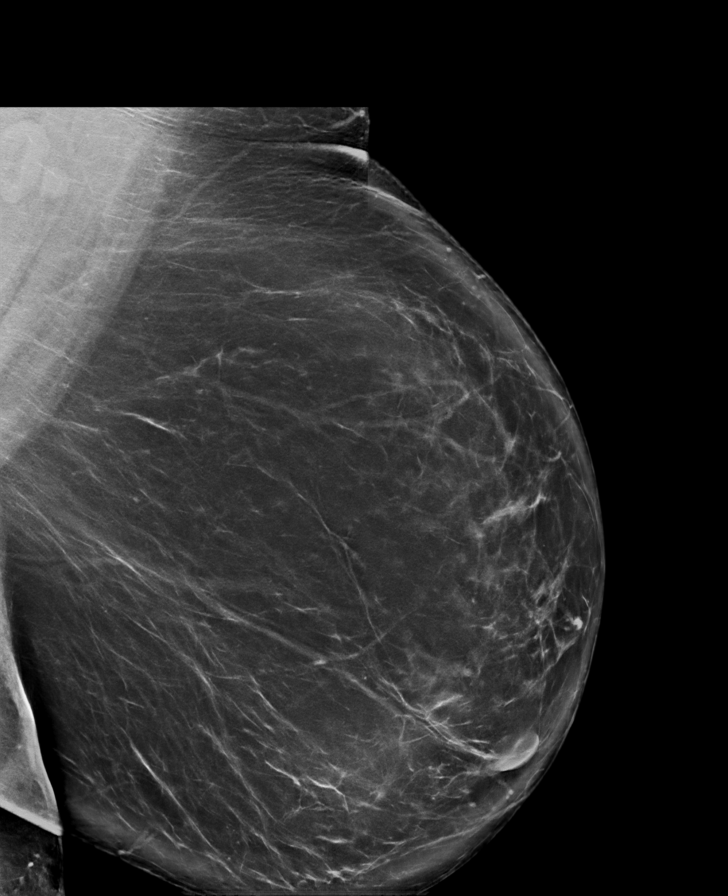

[R CC synth-2D]
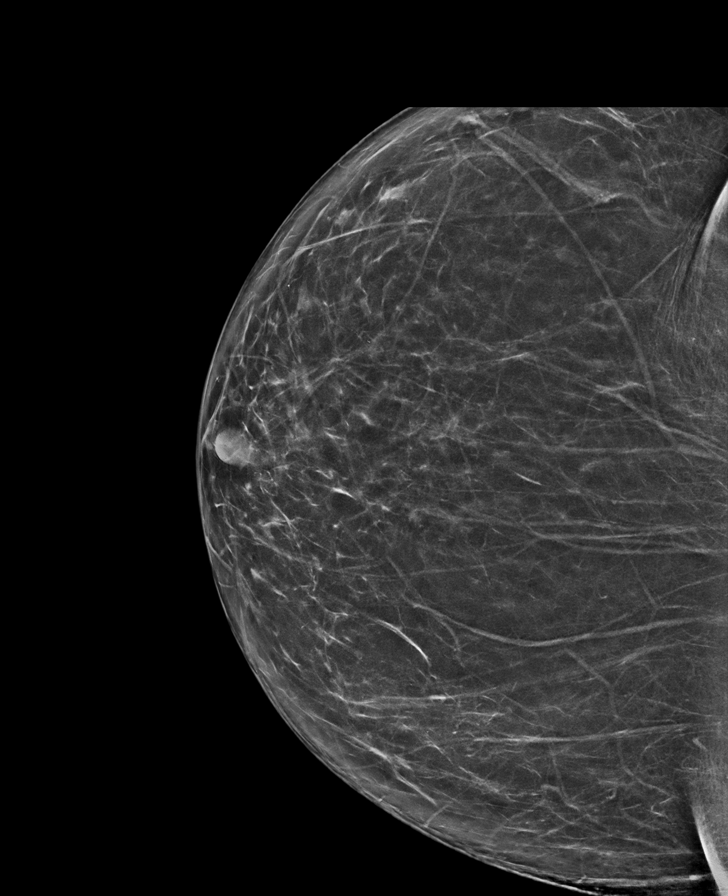

[R MLO synth-2D (2 of 2)]
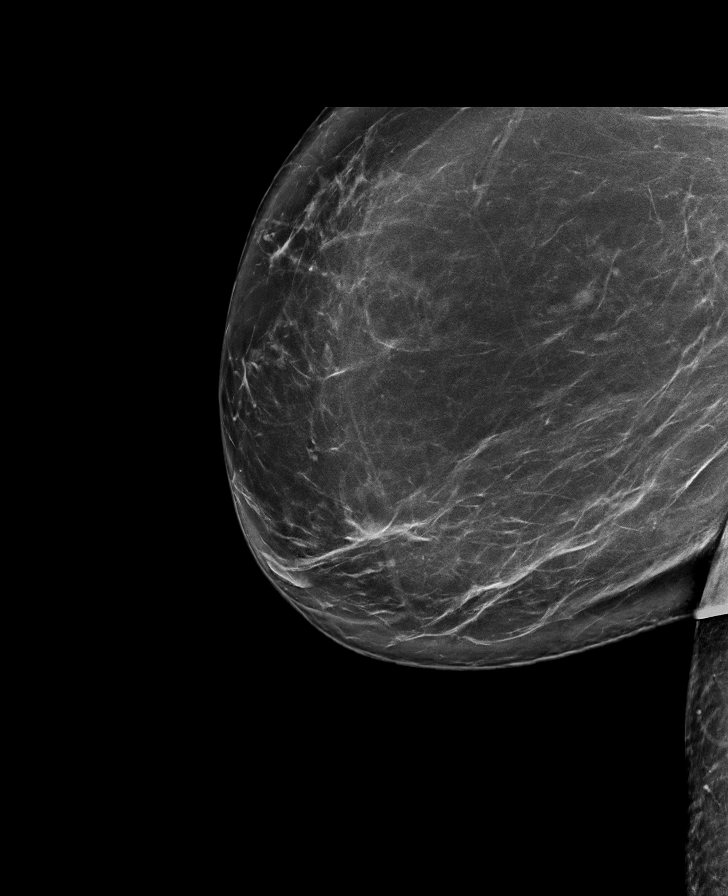

[R MLO tomo · tomo slice 55/108.0]
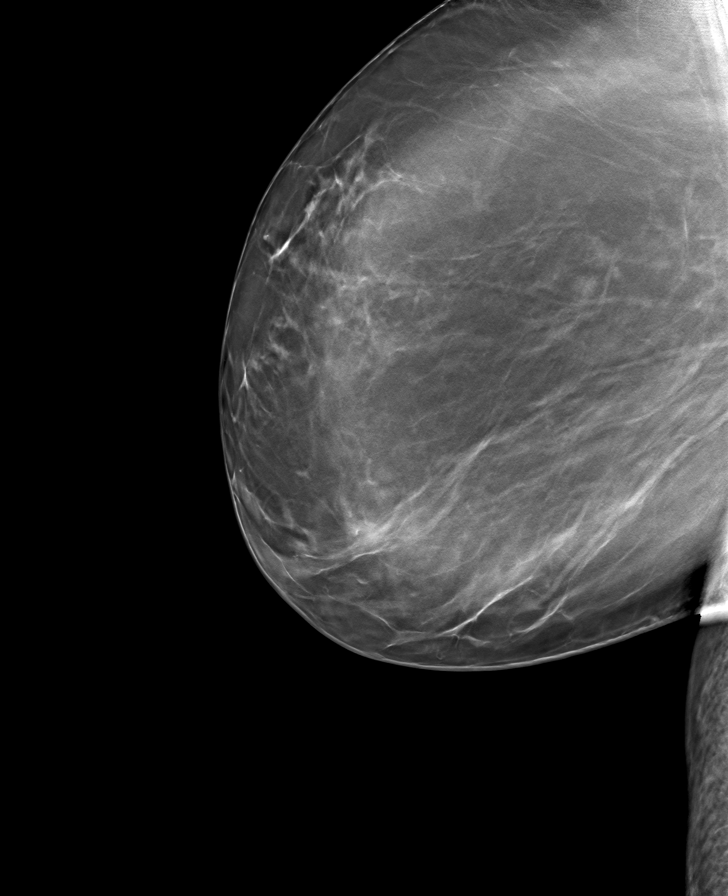

[8 of 40 positions shown; findings below may reference images not displayed]

ACR Breast Density Category b: There are scattered areas of
fibroglandular density.
FINDINGS: There are no findings suspicious for malignancy.
IMPRESSION: No mammographic evidence of malignancy. A result letter of this
screening mammogram will be mailed directly to the patient.

RECOMMENDATION:
Screening mammogram in one year. (Code:51-O-LD2)

BI-RADS CATEGORY  1: Negative.

## 2023-08-08 DIAGNOSIS — G4733 Obstructive sleep apnea (adult) (pediatric): Secondary | ICD-10-CM | POA: Diagnosis not present

## 2023-08-09 DIAGNOSIS — Z9884 Bariatric surgery status: Secondary | ICD-10-CM | POA: Diagnosis not present

## 2023-08-29 ENCOUNTER — Other Ambulatory Visit (HOSPITAL_BASED_OUTPATIENT_CLINIC_OR_DEPARTMENT_OTHER): Payer: Self-pay

## 2023-08-29 ENCOUNTER — Other Ambulatory Visit (HOSPITAL_COMMUNITY): Payer: Self-pay

## 2023-08-29 ENCOUNTER — Other Ambulatory Visit: Payer: Self-pay

## 2023-09-02 ENCOUNTER — Other Ambulatory Visit (HOSPITAL_COMMUNITY): Payer: Self-pay

## 2023-09-02 ENCOUNTER — Encounter (HOSPITAL_COMMUNITY): Payer: Self-pay

## 2023-09-05 ENCOUNTER — Encounter: Payer: Self-pay | Admitting: Internal Medicine

## 2023-09-05 ENCOUNTER — Ambulatory Visit (INDEPENDENT_AMBULATORY_CARE_PROVIDER_SITE_OTHER): Payer: 59 | Admitting: Internal Medicine

## 2023-09-05 VITALS — BP 120/64 | HR 77 | Ht 59.0 in | Wt 158.2 lb

## 2023-09-05 DIAGNOSIS — E1165 Type 2 diabetes mellitus with hyperglycemia: Secondary | ICD-10-CM

## 2023-09-05 DIAGNOSIS — Z7985 Long-term (current) use of injectable non-insulin antidiabetic drugs: Secondary | ICD-10-CM

## 2023-09-05 DIAGNOSIS — E66811 Obesity, class 1: Secondary | ICD-10-CM

## 2023-09-05 DIAGNOSIS — E785 Hyperlipidemia, unspecified: Secondary | ICD-10-CM | POA: Diagnosis not present

## 2023-09-05 MED ORDER — FREESTYLE LIBRE 3 PLUS SENSOR MISC: 1.0000 | 3 refills | Status: DC

## 2023-09-05 NOTE — Patient Instructions (Addendum)
 Please continue: - Ozempic 2 mg weekly   Try again to get the [place] CGM.  Please return in 6 months.

## 2023-09-05 NOTE — Progress Notes (Signed)
 Patient ID: Connie Hall, female   DOB: Feb 28, 1966, 58 y.o.   MRN: [id number]  HPI: Connie Hall is a 58 y.o.-year-old female, referred by her PCP, Dr. Perri, for management of DM2, dx at least in 2012, without long-term complications and also obesity.  Last visit 6 months ago.  Interim history: She had gastric bypass (R en Y) surgery In 10/2021. She lost >100 pounds afterwards. No increased urination, blurry vision, nausea, chest pain.   She feels great, has good energy. She is now going to Pilates.  Reviewed history: She was seen in the weight management clinic by Dr. Verdon. Also tried Weight watchers, Optivia, other meal-replacement plans, w/o good results. She gained ~50 lbs in 5 years.  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  Reviewed HbA1c levels: Lab Results  Component Value Date   HGBA1C 5.5 07/18/2023   HGBA1C 5.6 12/20/2022   HGBA1C 5.4 09/06/2022   HGBA1C 5.7 (H) 06/14/2022   HGBA1C 5.3 03/01/2022   HGBA1C 6.1 (H) 11/16/2021   HGBA1C 5.8 (H) 06/06/2021   HGBA1C 6.2 (A) 01/26/2021   HGBA1C 7.1 (A) 09/27/2020   HGBA1C 6.5 (H) 05/13/2020   HGBA1C 7.0 (A) 02/05/2020   HGBA1C 6.7 (H) 04/21/2019   HGBA1C 6.4 (H) 09/09/2018   HGBA1C 5.8 (H) 03/11/2018   HGBA1C 6.2 (H) 12/25/2017   HGBA1C 6.0 (H) 08/08/2017   HGBA1C 6.1 (H) 02/07/2017   HGBA1C 6.1 (H) 07/26/2016   HGBA1C 6.4 (H) 02/02/2016   HGBA1C 6.1 (H) 08/02/2015   She is on: - Ozempic  0.25 >> 0.5 >> 1 >> 2 mg weekly She was previously on metformin , stopped 02/2022. She was off Ozempic  for a period of time but her HbA1c increased and she restarted it.  Pt checks her sugars 1x a week (CGM not covered): - am:  70s-100 - 2h after b'fast: n/c - lunch: n/c - 2h after lunch: 120-150 - dinner: n/c - 2h after dinner: 120-150 - bedtime: n/c  Previously:   Lowest sugar was 80 >> 70 >> 60s; it is unclear at which level she has hypoglycemia awareness. Highest sugar was 110 >> 100 >>  200s.  -No CKD, last BUN/creatinine:  Lab Results  Component Value Date   BUN 11 07/18/2023   BUN 19 06/14/2022   CREATININE 0.66 07/18/2023   CREATININE 0.71 06/14/2022   Lab Results  Component Value Date   MICRALBCREAT 12 12/20/2022   MICRALBCREAT 4 06/06/2021   MICRALBCREAT 9 05/13/2020   MICRALBCREAT 8 09/09/2018   MICRALBCREAT 7 03/11/2018   MICRALBCREAT 3 08/08/2017   MICRALBCREAT 12 02/07/2017   MICRALBCREAT 7.1 01/20/2015  She is not on ACE inhibitor or ARB.  -+ Dyslipidemia; last set of lipids: Lab Results  Component Value Date   CHOL 117 07/18/2023   HDL 50 07/18/2023   LDLCALC 51 07/18/2023   TRIG 79 07/18/2023   CHOLHDL 2.3 07/18/2023  On Lipitor 10.  - last eye exam was in 11/2022: No DR reportedly.+ history of retinal holes. Dr. Kennyth.  - she denies numbness and tingling in her feet. Last foot exam 02/2023.  Pt has FH of DM in father (died of pancreatic cancer), 4 sisters. No h/o pancreatitis or FH of MTC.  She has a history of OSA.  ROS: + see HPI  I reviewed pt's medications, allergies, PMH, social hx, family hx, and changes were documented in the history of present illness. Otherwise, unchanged from my initial visit note.  Past Medical History:  Diagnosis Date  Back pain    Complication of anesthesia    low O2 sat   Deviated nasal septum    Discoid lupus    DM (diabetes mellitus) (HCC)    Family history of pancreatic cancer    Family history of prostate cancer    History of discoid lupus erythematosus    Hyperlipidemia    Leg edema    Obesity    OSA (obstructive sleep apnea)    Restless legs syndrome (RLS)    Vitamin D  deficiency    Past Surgical History:  Procedure Laterality Date   BIOPSY  07/24/2021   Procedure: BIOPSY;  Surgeon: Wilhelmenia Aloha Raddle., MD;  Location: [facility];  Service: Gastroenterology;;   BREAST BIOPSY Right 02/27/2023   MM RT BREAST BX W LOC DEV 1ST LESION IMAGE BX SPEC STEREO GUIDE 02/27/2023 [facility]  MAMMOGRAPHY   COLONOSCOPY WITH PROPOFOL  N/A 07/24/2021   Procedure: COLONOSCOPY WITH PROPOFOL ;  Surgeon: Wilhelmenia Aloha Raddle., MD;  Location: [facility];  Service: Gastroenterology;  Laterality: N/A;   ESOPHAGOGASTRODUODENOSCOPY (EGD) WITH PROPOFOL  N/A 07/24/2021   Procedure: ESOPHAGOGASTRODUODENOSCOPY (EGD) WITH PROPOFOL ;  Surgeon: Wilhelmenia Aloha Raddle., MD;  Location: [facility];  Service: Gastroenterology;  Laterality: N/A;   POLYPECTOMY  07/24/2021   Procedure: POLYPECTOMY;  Surgeon: Wilhelmenia Aloha Raddle., MD;  Location: [facility];  Service: Gastroenterology;;   ROTATOR CUFF REPAIR  2009   left   Sebaceous cyst removal, back  03/27/2008   XI ROBOTIC ASSISTED HIATAL HERNIA REPAIR N/A 11/21/2021   Procedure: XI ROBOTIC ASSISTED HIATAL HERNIA REPAIR;  Surgeon: Lyndel Deward PARAS, MD;  Location: [facility];  Service: General;  Laterality: N/A;   Social History   Socioeconomic History   Marital status: Single    Spouse name: Not on file   Number of children: 0   Years of education: Not on file   Highest education level: Bachelor's degree (e.g., BA, AB, BS)  Occupational History   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    Employer:   Tobacco Use   Smoking status: Former    Current packs/day: 0.00    Average packs/day: 1 pack/day for 15.0 years (15.0 ttl pk-yrs)    Types: Cigarettes    Start date: 08/28/1983    Quit date: 08/27/1998    Years since quitting: 25.0   Smokeless tobacco: Never  Vaping Use   Vaping status: Never Used  Substance and Sexual Activity   Alcohol use: Yes    Comment: rare   Drug use: No   Sexual activity: Not on file  Other Topics Concern   Not on file  Social History Narrative   Not on file   Social Drivers of Health   Financial Resource Strain: Low Risk  (12/21/2022)   Overall Financial Resource Strain (CARDIA)    Difficulty of Paying Living Expenses: Not hard at all  Food Insecurity: No Food Insecurity (12/21/2022)   Hunger Vital  Sign    Worried About Running Out of Food in the Last Year: Never true    Ran Out of Food in the Last Year: Never true  Transportation Needs: No Transportation Needs (12/21/2022)   PRAPARE - Administrator, Civil Service (Medical): No    Lack of Transportation (Non-Medical): No  Physical Activity: Sufficiently Active (12/21/2022)   Exercise Vital Sign    Days of Exercise per Week: 4 days    Minutes of Exercise per Session: 40 min  Stress: No Stress Concern Present (12/21/2022)   [employer] of Occupational  Health - Occupational Stress Questionnaire    Feeling of Stress : Only a little  Social Connections: Moderately Integrated (12/21/2022)   Social Connection and Isolation Panel [NHANES]    Frequency of Communication with Friends and Family: More than three times a week    Frequency of Social Gatherings with Friends and Family: More than three times a week    Attends Religious Services: More than 4 times per year    Active Member of [employer] or Organizations: Yes    Attends Engineer, Structural: More than 4 times per year    Marital Status: Never married  Catering Manager Violence: Not on file   Current Outpatient Medications on File Prior to Visit  Medication Sig Dispense Refill   ALLEGRA-D ALLERGY & CONGESTION 60-120 MG 12 hr tablet TAKE 1 TABLET BY MOUTH TWICE A DAY 180 tablet 2   atorvastatin  (LIPITOR) 20 MG tablet Take 1 tablet (20 mg total) by mouth daily. 90 tablet 3   Calcium  Carb-Cholecalciferol (CALCIUM  600+D) 600-10 MG-MCG TABS Take by mouth 4 (four) times daily -  before meals and at bedtime.     Cholecalciferol (VITAMIN D3) 125 MCG (5000 UT) CAPS Take 5,000 Units by mouth daily.     Continuous Glucose Sensor (FREESTYLE LIBRE 3 SENSOR) MISC 1 each by Does not apply route every 14 (fourteen) days. 6 each 3   fluticasone  (FLONASE ) 50 MCG/ACT nasal spray Place 2 sprays into both nostrils daily. 16 g PRN   glucose blood test strip Use as instructed 1x a day  - for One Touch Verio Reflect 100 each 3   metroNIDAZOLE  (METROCREAM ) 0.75 % cream Apply 1 application on the skin twice a day 45 g 11   Multiple Vitamin (MULTIVITAMIN) tablet Take 1 tablet by mouth daily.     OneTouch Delica Lancets 33G MISC Use 2x a day - for One Touch Verio Reveal 100 each 3   rOPINIRole  (REQUIP ) 0.5 MG tablet Take 1 tablet (0.5 mg total) by mouth daily in the afternoon AND 2 tablets (1 mg total) at bedtime. 270 tablet 1   Semaglutide , 2 MG/DOSE, (OZEMPIC , 2 MG/DOSE,) 8 MG/3ML SOPN Inject 2 mg into the skin once a week. 9 mL 3   No current facility-administered medications on file prior to visit.   Allergies  Allergen Reactions   Sulfa Antibiotics Hives   Sulfonamide Derivatives Hives   Family History  Problem Relation Age of Onset   Skin cancer Mother    Hyperlipidemia Mother    Hypertension Father    Stroke Father    Heart disease Father    Diabetes Father    Pancreatic cancer Father 71   Prostate cancer Father        dx in his 85s; prostatectomy   Sleep apnea Father    Obesity Father    Pancreatic cancer Sister 24       neuroendocrine tumor   Colon polyps Sister    Colon polyps Sister    Colon polyps Sister    Congestive Heart Failure Maternal Aunt    Dementia Maternal Uncle    Lung cancer Paternal Aunt    Heart attack Paternal Uncle    Pancreatic cancer Paternal Uncle 18   Testicular cancer Cousin        paternal first cousin dx in his 31s   Colon cancer Neg Hx    Esophageal cancer Neg Hx    Inflammatory bowel disease Neg Hx    Liver disease Neg Hx  Stomach cancer Neg Hx    Rectal cancer Neg Hx    PE: BP 120/64   Pulse 77   Ht 4' 11 (1.499 m)   Wt 158 lb 3.2 oz (71.8 kg)   LMP 02/08/2021 (Approximate)   SpO2 97%   BMI 31.95 kg/m  Wt Readings from Last 3 Encounters:  09/05/23 158 lb 3.2 oz (71.8 kg)  07/22/23 163 lb (73.9 kg)  05/08/23 163 lb 9.6 oz (74.2 kg)   Constitutional: overweight, in NAD Eyes: EOMI, no exophthalmos ENT:  no thyromegaly, no cervical lymphadenopathy Cardiovascular: RRR, No MRG, + pitting LE edema B Respiratory: CTA B Musculoskeletal: no deformities Skin: no rashes Neurological: no tremor with outstretched hands  ASSESSMENT: 1.  Type 2 diabetes, non-insulin -dependent, recent diagnosis, without complications  2.  Obesity class I  3. HL  PLAN:  1. Patient with history of prediabetes but a new diagnosis is in 2021, when HbA1c returned 7.0%.  She is on Ozempic  only, which we increased at last visit to 2 mg weekly.  At that time, sugars were excellent during the night, very stable, but variable throughout the day, with rare blood sugars above 180 after dietary indiscretions.  Latest HbA1c was 5.5%, decreased, 1.5 months ago. -At last visit I suggested to change from the Dexcom CGM to the [place] 3 CGM due to cost.  However, she was not able to obtain this due to lack of coverage.  As of now, she is not checking sugars consistently.  We discussed about checking every day or every other day, rotating check times, but it would be helpful for her to have a CGM for immediate input about her blood sugars and dietary changes.  I did send a prescription for the freestyle libre 3+ to her pharmacy in case this is covered this year.  Otherwise, for now, we will continue the current regimen as she is doing very well with it. - I suggested to:  Patient Instructions  Please continue: - Ozempic  2 mg weekly   Try again to get the Libre CGM.  Please return in 6 months.  - advised to check sugars at different times of the day - 4x a day, rotating check times - advised for yearly eye exams >> she is UTD - return to clinic in 6 months  2.  Obesity class I -She continues on Ozempic  -We did discuss in the past about the possibility of decreasing her Ozempic  dose, but we did not do so yet. -She initially lost 60 pounds after her gastric bypass surgery and then lost 24 more. -However, before last visit, she only  lost 2 pounds since the previous visit.  We increased her Ozempic  dose at that time. -She lost 14 pounds since last visit -Her goal weight is 140 pounds.  3. HL -Reviewed latest lipid panel from 06/2023: Fractions at goal: Lab Results  Component Value Date   CHOL 117 07/18/2023   HDL 50 07/18/2023   LDLCALC 51 07/18/2023   TRIG 79 07/18/2023   CHOLHDL 2.3 07/18/2023  -She continues Lipitor 20 mg daily without side effects  Lela Fendt, MD PhD [facility]

## 2023-09-23 ENCOUNTER — Other Ambulatory Visit: Payer: Self-pay

## 2023-09-23 ENCOUNTER — Other Ambulatory Visit (INDEPENDENT_AMBULATORY_CARE_PROVIDER_SITE_OTHER): Payer: Self-pay

## 2023-09-23 ENCOUNTER — Encounter: Payer: Self-pay | Admitting: Orthopedic Surgery

## 2023-09-23 ENCOUNTER — Ambulatory Visit (INDEPENDENT_AMBULATORY_CARE_PROVIDER_SITE_OTHER): Payer: 59 | Admitting: Orthopedic Surgery

## 2023-09-23 DIAGNOSIS — M25512 Pain in left shoulder: Secondary | ICD-10-CM

## 2023-09-23 DIAGNOSIS — G8929 Other chronic pain: Secondary | ICD-10-CM

## 2023-09-23 DIAGNOSIS — M7502 Adhesive capsulitis of left shoulder: Secondary | ICD-10-CM | POA: Diagnosis not present

## 2023-09-23 NOTE — Progress Notes (Unsigned)
Office Visit Note   Patient: Connie Hall           Date of Birth: [birth year]           MRN: [id number] Visit Date: 09/23/2023 Requested by: Connie Mackintosh, MD [address] [place],  [place] [id number] PCP: Connie Mackintosh, MD  Subjective: Chief Complaint  Patient presents with   Left Shoulder - Pain    HPI: Connie Hall is a 58 y.o. female who presents to the office reporting left shoulder pain.  Been going on several months.  Patient states she "cannot do things".  She wants to get back to doing Pilates.  Hard for her to do arm circles and push-ups and she does report some pain on the left-hand side when she sleeping.  Denies any popping or grinding.  She does office work.  She has done physical therapy.  She likes to exercise 4-5 times a week.  Has taken sporadic over-the-counter medication for the problem.  She does work full-time at the office..                ROS: All systems reviewed are negative as they relate to the chief complaint within the history of present illness.  Patient denies fevers or chills.  Assessment & Plan: Visit Diagnoses:  1. 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  She does have a little bit of diminished external rotation on the left compared to the right.  Her AC joint also has some crepitus on exam but is not particular symptomatic to palpation.  Plan at this time is ultrasound-guided injection into the glenohumeral joint with continuation of her exercise routine in about 3 to 4 days.  Come back in 6 weeks for clinical recheck to evaluate how well the injection did as well as to evaluate her motion and the [facility] joint.  Follow-Up Instructions: No follow-ups on file.   Orders:  Orders Placed This Encounter  Procedures   XR Shoulder Left   US Guided Needle Placement - No Linked Charges   No orders of the defined types were placed in this encounter.     Procedures: Large Joint Inj: L glenohumeral on  09/23/2023 7:17 PM Indications: diagnostic evaluation and pain Details: 22 G 3.5 in needle, ultrasound-guided posterior approach  Arthrogram: No  Medications: 9 mL bupivacaine 0.5 %; 40 mg methylPREDNISolone acetate 40 MG/ML; 5 mL lidocaine 1 % Outcome: tolerated well, no immediate complications Procedure, treatment alternatives, risks and benefits explained, specific risks discussed. Consent was given by the patient. Immediately prior to procedure a time out was called to verify the correct patient, procedure, equipment, support staff and site/side marked as required. Patient was prepped and draped in the usual sterile fashion.       Clinical Data: No additional findings.  Objective: Vital Signs: LMP 02/08/2021 (Approximate)   Physical Exam:  Constitutional: Patient appears well-developed HEENT:  Head: Normocephalic Eyes:EOM are normal Neck: Normal range of motion Cardiovascular: Normal rate Pulmonary/chest: Effort normal Neurologic: Patient is alert Skin: Skin is warm Psychiatric: Patient has normal mood and affect  Ortho Exam: Ortho exam demonstrates range of motion on the left of 50/95/170.  Range of motion on the right is 70/110/180.  Rotator cuff strength is intact bilaterally to infraspinatus oops Mason subscap muscle testing.  No masses lymphadenopathy or skin changes noted in either shoulder girdle region.  AC joint on the left has a little bit more grinding with  crossarm adduction but no discrete pain.  Negative O'Brien's testing right and left-hand side.  Most of her pain is primarily anterior.  Motor or sensory function in both hands intact.  Specialty Comments:  No specialty comments available.  Imaging: No results found.   PMFS History: Patient Active Problem List   Diagnosis Date Noted   Encounter for preoperative examination for general surgical procedure 06/15/2021   Morbid obesity (HCC) 06/15/2021   Colon cancer screening 06/15/2021   Complication of  anesthesia 06/15/2021   Impaired glucose tolerance 09/15/2017   Genetic testing 11/21/2016   Family history of pancreatic cancer    Family history of prostate cancer    Carpal tunnel syndrome 05/11/2011   Dependent edema 05/11/2011   Class 3 obesity 05/11/2011   Obstructive sleep apnea 11/03/2009   Allergic rhinitis 10/21/2009   LUPUS ERYTHEMATOSUS, DISCOID 10/21/2009   HYPERSOMNIA 10/21/2009   Vitamin D deficiency 10/20/2009   Hyperlipidemia 10/20/2009   RESTLESS LEG SYNDROME 10/20/2009   Past Medical History:  Diagnosis Date   Back pain    Complication of anesthesia    low O2 sat   Deviated nasal septum    Discoid lupus    DM (diabetes mellitus) (HCC)    Family history of pancreatic cancer    Family history of prostate cancer    History of discoid lupus erythematosus    Hyperlipidemia    Leg edema    Obesity    OSA (obstructive sleep apnea)    Restless legs syndrome (RLS)    Vitamin D deficiency     Family History  Problem Relation Age of Onset   Skin cancer Mother    Hyperlipidemia Mother    Hypertension Father    Stroke Father    Heart disease Father    Diabetes Father    Pancreatic cancer Father 37   Prostate cancer Father        dx in his 64s; prostatectomy   Sleep apnea Father    Obesity Father    Pancreatic cancer Sister 60       neuroendocrine tumor   Colon polyps Sister    Colon polyps Sister    Colon polyps Sister    Congestive Heart Failure Maternal Aunt    Dementia Maternal Uncle    Lung cancer Paternal Aunt    Heart attack Paternal Uncle    Pancreatic cancer Paternal Uncle 5   Testicular cancer Cousin        paternal first cousin dx in his 7s   Colon cancer Neg Hx    Esophageal cancer Neg Hx    Inflammatory bowel disease Neg Hx    Liver disease Neg Hx    Stomach cancer Neg Hx    Rectal cancer Neg Hx     Past Surgical History:  Procedure Laterality Date   BIOPSY  07/24/2021   Procedure: BIOPSY;  Surgeon: Meridee Score, Netty Starring., MD;   Location: [facility];  Service: Gastroenterology;;   BREAST BIOPSY Right 02/27/2023   MM RT BREAST BX W LOC DEV 1ST LESION IMAGE BX SPEC STEREO GUIDE 02/27/2023 [facility] MAMMOGRAPHY   COLONOSCOPY WITH PROPOFOL N/A 07/24/2021   Procedure: COLONOSCOPY WITH PROPOFOL;  Surgeon: Lemar Lofty., MD;  Location: [facility];  Service: Gastroenterology;  Laterality: N/A;   ESOPHAGOGASTRODUODENOSCOPY (EGD) WITH PROPOFOL N/A 07/24/2021   Procedure: ESOPHAGOGASTRODUODENOSCOPY (EGD) WITH PROPOFOL;  Surgeon: Meridee Score Netty Starring., MD;  Location: [facility];  Service: Gastroenterology;  Laterality: N/A;   POLYPECTOMY  07/24/2021   Procedure: POLYPECTOMY;  Surgeon: Lemar Lofty., MD;  Location: [facility];  Service: Gastroenterology;;   ROTATOR CUFF REPAIR  2009   left   Sebaceous cyst removal, back  03/27/2008   XI ROBOTIC ASSISTED HIATAL HERNIA REPAIR N/A 11/21/2021   Procedure: XI ROBOTIC ASSISTED HIATAL HERNIA REPAIR;  Surgeon: Quentin Ore, MD;  Location: [facility];  Service: General;  Laterality: N/A;   Social History   Occupational History   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

## 2023-09-25 ENCOUNTER — Encounter: Payer: Self-pay | Admitting: Orthopedic Surgery

## 2023-09-25 MED ORDER — LIDOCAINE HCL 1 % IJ SOLN
5.0000 mL | INTRAMUSCULAR | Status: AC | PRN
Start: 2023-09-23 — End: 2023-09-23
  Administered 2023-09-23: 5 mL

## 2023-09-25 MED ORDER — METHYLPREDNISOLONE ACETATE 40 MG/ML IJ SUSP
40.0000 mg | INTRAMUSCULAR | Status: AC | PRN
Start: 2023-09-23 — End: 2023-09-23
  Administered 2023-09-23: 40 mg via INTRA_ARTICULAR

## 2023-09-25 MED ORDER — BUPIVACAINE HCL 0.5 % IJ SOLN
9.0000 mL | INTRAMUSCULAR | Status: AC | PRN
Start: 2023-09-23 — End: 2023-09-23
  Administered 2023-09-23: 9 mL via INTRA_ARTICULAR

## 2023-11-04 ENCOUNTER — Ambulatory Visit (INDEPENDENT_AMBULATORY_CARE_PROVIDER_SITE_OTHER): Payer: 59 | Admitting: Orthopedic Surgery

## 2023-11-04 ENCOUNTER — Encounter: Payer: Self-pay | Admitting: Orthopedic Surgery

## 2023-11-04 DIAGNOSIS — M7502 Adhesive capsulitis of left shoulder: Secondary | ICD-10-CM | POA: Diagnosis not present

## 2023-11-04 NOTE — Progress Notes (Unsigned)
 Office Visit Note   Patient: Connie Hall           Date of Birth: 09-17-1965           MRN: [id number] Visit Date: 11/04/2023 Requested by: Margaree Mackintosh, MD [address] [place],  [place] [address] PCP: Margaree Mackintosh, MD  Subjective: Chief Complaint  Patient presents with   Other    Follow up shoulder after GH injection    HPI: Connie Hall is a 58 y.o. female who presents to the office reporting left shoulder pain.  Patient has a history of adhesive capsulitis.  Injection performed 09/23/2023.  She has had about 100% relief from that injection.  She feels great with no problems.  She is looking to make sure that she can start using a 3 pound weight to help build up some muscle strength.  Currently not taking any medication..                ROS: All systems reviewed are negative as they relate to the chief complaint within the history of present illness.  Patient denies fevers or chills.  Assessment & Plan: Visit Diagnoses:  1. Adhesive capsulitis of left shoulder     Plan: Impression is improvement and resolution of early adhesive capsulitis from glenohumeral joint injection.  I think she is okay to start doing 3 pound weights but I would also continue stretching primarily of that shoulder joint to make sure this adhesive capsulitis does not recur.  She will come back to see [place] as needed.  Follow-Up Instructions: No follow-ups on file.   Orders:  No orders of the defined types were placed in this encounter.  No orders of the defined types were placed in this encounter.     Procedures: No procedures performed   Clinical Data: No additional findings.  Objective: Vital Signs: LMP 02/08/2021 (Approximate)   Physical Exam:  Constitutional: Patient appears well-developed HEENT:  Head: Normocephalic Eyes:EOM are normal Neck: Normal range of motion Cardiovascular: Normal rate Pulmonary/chest: Effort normal Neurologic: Patient is alert Skin: Skin is  warm Psychiatric: Patient has normal mood and affect  Ortho Exam: Ortho exam demonstrates symmetric range of motion both shoulders of 60/90/160.  Good rotator cuff strength infraspinatus supraspinatus and subscap muscle testing.  No coarse grinding or crepitus present in that left shoulder with passive range of motion.  Specialty Comments:  No specialty comments available.  Imaging: No results found.   PMFS History: Patient Active Problem List   Diagnosis Date Noted   Encounter for preoperative examination for general surgical procedure 06/15/2021   Morbid obesity (HCC) 06/15/2021   Colon cancer screening 06/15/2021   Complication of anesthesia 06/15/2021   Impaired glucose tolerance 09/15/2017   Genetic testing 11/21/2016   Family history of pancreatic cancer    Family history of prostate cancer    Carpal tunnel syndrome 05/11/2011   Dependent edema 05/11/2011   Class 3 obesity 05/11/2011   Obstructive sleep apnea 11/03/2009   Allergic rhinitis 10/21/2009   LUPUS ERYTHEMATOSUS, DISCOID 10/21/2009   HYPERSOMNIA 10/21/2009   Vitamin D deficiency 10/20/2009   Hyperlipidemia 10/20/2009   RESTLESS LEG SYNDROME 10/20/2009   Past Medical History:  Diagnosis Date   Back pain    Complication of anesthesia    low O2 sat   Deviated nasal septum    Discoid lupus    DM (diabetes mellitus) (HCC)    Family history of pancreatic cancer    Family history of prostate cancer  History of discoid lupus erythematosus    Hyperlipidemia    Leg edema    Obesity    OSA (obstructive sleep apnea)    Restless legs syndrome (RLS)    Vitamin D deficiency     Family History  Problem Relation Age of Onset   Skin cancer Mother    Hyperlipidemia Mother    Hypertension Father    Stroke Father    Heart disease Father    Diabetes Father    Pancreatic cancer Father 4   Prostate cancer Father        dx in his 11s; prostatectomy   Sleep apnea Father    Obesity Father    Pancreatic cancer  Sister 30       neuroendocrine tumor   Colon polyps Sister    Colon polyps Sister    Colon polyps Sister    Congestive Heart Failure Maternal Aunt    Dementia Maternal Uncle    Lung cancer Paternal Aunt    Heart attack Paternal Uncle    Pancreatic cancer Paternal Uncle 47   Testicular cancer Cousin        paternal first cousin dx in his 66s   Colon cancer Neg Hx    Esophageal cancer Neg Hx    Inflammatory bowel disease Neg Hx    Liver disease Neg Hx    Stomach cancer Neg Hx    Rectal cancer Neg Hx     Past Surgical History:  Procedure Laterality Date   BIOPSY  07/24/2021   Procedure: BIOPSY;  Surgeon: Meridee Score, Netty Starring., MD;  Location: [facility];  Service: Gastroenterology;;   BREAST BIOPSY Right 02/27/2023   MM RT BREAST BX W LOC DEV 1ST LESION IMAGE BX SPEC STEREO GUIDE 02/27/2023 [facility]   COLONOSCOPY WITH PROPOFOL N/A 07/24/2021   Procedure: COLONOSCOPY WITH PROPOFOL;  Surgeon: Lemar Lofty., MD;  Location: [facility];  Service: Gastroenterology;  Laterality: N/A;   ESOPHAGOGASTRODUODENOSCOPY (EGD) WITH PROPOFOL N/A 07/24/2021   Procedure: ESOPHAGOGASTRODUODENOSCOPY (EGD) WITH PROPOFOL;  Surgeon: Meridee Score Netty Starring., MD;  Location: [facility];  Service: Gastroenterology;  Laterality: N/A;   POLYPECTOMY  07/24/2021   Procedure: POLYPECTOMY;  Surgeon: Meridee Score Netty Starring., MD;  Location: [facility];  Service: Gastroenterology;;   ROTATOR CUFF REPAIR  2009   left   Sebaceous cyst removal, back  03/27/2008   XI ROBOTIC ASSISTED HIATAL HERNIA REPAIR N/A 11/21/2021   Procedure: XI ROBOTIC ASSISTED HIATAL HERNIA REPAIR;  Surgeon: Quentin Ore, MD;  Location: [facility];  Service: General;  Laterality: N/A;   Social History   Occupational History   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    Employer: [employer]  Tobacco Use   Smoking status: Former    Current packs/day: 0.00    Average packs/day: 1 pack/day for 15.0 years (15.0 ttl  pk-yrs)    Types: Cigarettes    Start date: 08/28/1983    Quit date: 08/27/1998    Years since quitting: 25.2   Smokeless tobacco: Never  Vaping Use   Vaping status: Never Used  Substance and Sexual Activity   Alcohol use: Yes    Comment: rare   Drug use: No   Sexual activity: Not on file

## 2023-11-19 DIAGNOSIS — D485 Neoplasm of uncertain behavior of skin: Secondary | ICD-10-CM | POA: Diagnosis not present

## 2023-11-21 DIAGNOSIS — G4733 Obstructive sleep apnea (adult) (pediatric): Secondary | ICD-10-CM | POA: Diagnosis not present

## 2023-11-22 ENCOUNTER — Telehealth: Payer: Self-pay | Admitting: Internal Medicine

## 2023-11-22 NOTE — Telephone Encounter (Signed)
 Copied from CRM [id number]. Topic: Referral - Request for Referral >> Nov 22, 2023  9:56 AM Carlatta H wrote: Did the patient discuss referral with their provider in the last year? No (If No - schedule appointment) (If Yes - send message)  Appointment offered? No  Type of order/referral and detailed reason for visit: Lump under left arm  Preference of office, provider, location: Mammogram with ultrasound//The breast of Connie Hall//  If referral order, have you been seen by this specialty before? No (If Yes, this issue or another issue? When? Where?  Can we respond through MyChart? Yes

## 2023-11-29 NOTE — Telephone Encounter (Signed)
 Pt has appointment on 4/15.  Office visit has to be done first to [place] location to put in referral.

## 2023-12-09 NOTE — Progress Notes (Signed)
 Patient Care Team: Sylvan Evener, MD as PCP - General (Internal Medicine) Loyde Rule, MD as PCP - Cardiology (Cardiology)  Visit Date: 12/10/23  Subjective:   Chief Complaint  Patient presents with   Mass    Left armpit  Patient ZO:XWRUEA Ryles,Female DOB:01/17/1966,57 y.o. VWU:[id number]   58 y.o. Female presents today for acute visit with an Axillary Mass, Left. She says that she noticed a lump under her left arm 1-2 months ago which she thought may have been an ingrown hair, so went to Dermatology where she saw Dr. Daved Eriksson who suggested that it may be a swollen lymph-node and could be visualized on her mammogram upcoming in June, but she wanted this to be evaluated sooner due to her hx of abnormal mammogram last year. Denies recent illness.  Past Medical History:  Diagnosis Date   Back pain    Complication of anesthesia    low O2 sat   Deviated nasal septum    Discoid lupus    DM (diabetes mellitus) (HCC)    Family history of pancreatic cancer    Family history of prostate cancer    History of discoid lupus erythematosus    Hyperlipidemia    Leg edema    Obesity    OSA (obstructive sleep apnea)    Restless legs syndrome (RLS)    Vitamin D  deficiency     Allergies  Allergen Reactions   Sulfa Antibiotics Hives   Sulfonamide Derivatives Hives    Family History  Problem Relation Age of Onset   Skin cancer Mother    Hyperlipidemia Mother    Hypertension Father    Stroke Father    Heart disease Father    Diabetes Father    Pancreatic cancer Father 10   Prostate cancer Father        dx in his 37s; prostatectomy   Sleep apnea Father    Obesity Father    Pancreatic cancer Sister 18       neuroendocrine tumor   Colon polyps Sister    Colon polyps Sister    Colon polyps Sister    Congestive Heart Failure Maternal Aunt    Dementia Maternal Uncle    Lung cancer Paternal Aunt    Heart attack Paternal Uncle    Pancreatic cancer Paternal Uncle 33   Testicular  cancer Cousin        paternal first cousin dx in his 41s   Colon cancer Neg Hx    Esophageal cancer Neg Hx    Inflammatory bowel disease Neg Hx    Liver disease Neg Hx    Stomach cancer Neg Hx    Rectal cancer Neg Hx    Social History   Social History Narrative   Not on file  Works as an Psychologist, educational in Administrator, 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 Going on a cruise to the [place] areas Review of Systems  Constitutional:        (+) Mass, left armpit  All other systems reviewed and are negative.    Objective:  Vitals: BP 102/80   Pulse (!) 58   Temp (!) 96.4 F (35.8 C) (Temporal)   LMP 02/08/2021 (Approximate)   SpO2 99%   Physical Exam Vitals and nursing note reviewed.  Constitutional:      General: She is not in acute distress.    Appearance: Normal appearance. She is not toxic-appearing.  HENT:     Head: Normocephalic and atraumatic.  Pulmonary:  Effort: Pulmonary effort is normal.  Chest:     Comments: Upper left outer quadrant thickening of breast noted Fibrocystic changes of breasts noted  Area of quarter sized thickening in left axilla Skin:    General: Skin is warm and dry.  Neurological:     Mental Status: She is alert and oriented to person, place, and time. Mental status is at baseline.  Psychiatric:        Mood and Affect: Mood normal.        Behavior: Behavior normal.        Thought Content: Thought content normal.        Judgment: Judgment normal.     Results:  Studies Obtained And Personally Reviewed By Me: Labs:     Component Value Date/Time   NA 143 07/18/2023 0944   NA 140 12/25/2017 0923   K 4.8 07/18/2023 0944   CL 106 07/18/2023 0944   CO2 28 07/18/2023 0944   GLUCOSE 81 07/18/2023 0944   BUN 11 07/18/2023 0944   BUN 14 12/25/2017 0923   CREATININE 0.66 07/18/2023 0944   CALCIUM  9.5 07/18/2023 0944   PROT 6.5 07/18/2023 0944   PROT 7.0 12/25/2017 0923   ALBUMIN 4.4 11/16/2021 1303   ALBUMIN 4.0 12/25/2017  0923   AST 25 07/18/2023 0944   ALT 33 (H) 07/18/2023 0944   ALKPHOS 50 11/16/2021 1303   BILITOT 0.5 07/18/2023 0944   BILITOT 0.3 12/25/2017 0923   GFRNONAA >60 11/21/2021 1409   GFRNONAA 78 05/13/2020 0914   GFRAA 90 05/13/2020 0914    Lab Results  Component Value Date   WBC 5.8 07/18/2023   HGB 12.7 07/18/2023   HCT 38.2 07/18/2023   MCV 91.4 07/18/2023   PLT 265 07/18/2023   Lab Results  Component Value Date   CHOL 117 07/18/2023   HDL 50 07/18/2023   LDLCALC 51 07/18/2023   TRIG 79 07/18/2023   CHOLHDL 2.3 07/18/2023   Lab Results  Component Value Date   HGBA1C 5.5 07/18/2023    Lab Results  Component Value Date   TSH 1.68 07/18/2023   Assessment & Plan:   Orders Placed This Encounter  Procedures   MM 3D DIAGNOSTIC MAMMOGRAM UNILATERAL LEFT BREAST   Meds ordered this encounter  Medications   azithromycin  (ZITHROMAX ) 250 MG tablet    Sig: Take 2 tablets on day 1, then 1 tablet daily on days 2 through 5    Dispense:  6 tablet    Refill:  0  Axillary Mass, Left: she noticed a lump under her left arm 1-2 months ago, went to Dermatology where she saw Dr. Daved Eriksson who suggested that it may be a swollen lymph-node, though she has not been ill recently. On exam there is a palpable quarter-sized hardened area of her left axilla, no fullness appreciated in left breast. She does have an upcoming mammogram in June, however due to her hx of abnormal mammogram last year she wanted this to be evaluated sooner.  Travel Advise: upcoming travel, she requests an antibiotics in case she becomes ill. Sending in 250 mg Azithromycin  - take 2 tablets on Day 1 and 1 tablet on Days 2-5.   I,Emily Lagle,acting as a Neurosurgeon for Sylvan Evener, MD.,have documented all relevant documentation on the behalf of Sylvan Evener, MD,as directed by  Sylvan Evener, MD while in the presence of Sylvan Evener, MD.   I, Sylvan Evener, MD, have reviewed all documentation for this visit.  The documentation  on 12/24/23 for the exam, diagnosis, procedures, and orders are all accurate and complete.

## 2023-12-10 ENCOUNTER — Ambulatory Visit: Payer: Self-pay | Admitting: Internal Medicine

## 2023-12-10 ENCOUNTER — Other Ambulatory Visit: Payer: Self-pay

## 2023-12-10 VITALS — BP 102/80 | HR 58 | Temp 96.4°F

## 2023-12-10 DIAGNOSIS — R2232 Localized swelling, mass and lump, left upper limb: Secondary | ICD-10-CM | POA: Diagnosis not present

## 2023-12-10 MED ORDER — AZITHROMYCIN 250 MG PO TABS
ORAL_TABLET | ORAL | 0 refills | Status: AC
  Filled 2023-12-10: qty 6, 5d supply, fill #0

## 2023-12-11 ENCOUNTER — Other Ambulatory Visit (HOSPITAL_COMMUNITY): Payer: Self-pay

## 2023-12-11 ENCOUNTER — Other Ambulatory Visit: Payer: Self-pay

## 2023-12-11 ENCOUNTER — Other Ambulatory Visit: Payer: Self-pay | Admitting: Internal Medicine

## 2023-12-11 DIAGNOSIS — R2232 Localized swelling, mass and lump, left upper limb: Secondary | ICD-10-CM

## 2023-12-24 ENCOUNTER — Encounter: Payer: Self-pay | Admitting: Internal Medicine

## 2023-12-24 NOTE — Patient Instructions (Signed)
 We have ordered diagnostic mammogram . Please call them for appt.

## 2023-12-26 ENCOUNTER — Ambulatory Visit
Admission: RE | Admit: 2023-12-26 | Discharge: 2023-12-26 | Disposition: A | Discharge status: Home / Self Care | Source: Ambulatory Visit | Attending: Internal Medicine | Admitting: Internal Medicine

## 2023-12-26 DIAGNOSIS — R2232 Localized swelling, mass and lump, left upper limb: Secondary | ICD-10-CM

## 2023-12-26 DIAGNOSIS — M79622 Pain in left upper arm: Secondary | ICD-10-CM | POA: Diagnosis not present

## 2023-12-26 DIAGNOSIS — N644 Mastodynia: Secondary | ICD-10-CM | POA: Diagnosis not present

## 2023-12-31 DIAGNOSIS — H524 Presbyopia: Secondary | ICD-10-CM | POA: Diagnosis not present

## 2023-12-31 DIAGNOSIS — H5213 Myopia, bilateral: Secondary | ICD-10-CM | POA: Diagnosis not present

## 2023-12-31 DIAGNOSIS — H43811 Vitreous degeneration, right eye: Secondary | ICD-10-CM | POA: Diagnosis not present

## 2023-12-31 DIAGNOSIS — H04123 Dry eye syndrome of bilateral lacrimal glands: Secondary | ICD-10-CM | POA: Diagnosis not present

## 2023-12-31 DIAGNOSIS — H52223 Regular astigmatism, bilateral: Secondary | ICD-10-CM | POA: Diagnosis not present

## 2023-12-31 DIAGNOSIS — H18593 Other hereditary corneal dystrophies, bilateral: Secondary | ICD-10-CM | POA: Diagnosis not present

## 2023-12-31 DIAGNOSIS — H59811 Chorioretinal scars after surgery for detachment, right eye: Secondary | ICD-10-CM | POA: Diagnosis not present

## 2023-12-31 LAB — HM DIABETES EYE EXAM

## 2024-01-14 ENCOUNTER — Other Ambulatory Visit: Payer: 59

## 2024-01-14 DIAGNOSIS — E119 Type 2 diabetes mellitus without complications: Secondary | ICD-10-CM

## 2024-01-14 DIAGNOSIS — E785 Hyperlipidemia, unspecified: Secondary | ICD-10-CM

## 2024-01-14 LAB — HEMOGLOBIN A1C
Hgb A1c MFr Bld: 5.4 % (ref <5.7)
Mean Plasma Glucose: 108 mg/dL
eAG (mmol/L): 6 mmol/L

## 2024-01-14 LAB — HEPATIC FUNCTION PANEL
AG Ratio: 1.7 (calc) (ref 1.0–2.5)
ALT: 28 U/L (ref 6–29)
AST: 21 U/L (ref 10–35)
Albumin: 4.4 g/dL (ref 3.6–5.1)
Alkaline phosphatase (APISO): 48 U/L (ref 37–153)
Bilirubin, Direct: 0.2 mg/dL (ref 0.0–0.2)
Globulin: 2.6 g/dL (ref 1.9–3.7)
Indirect Bilirubin: 0.5 mg/dL (ref 0.2–1.2)
Total Bilirubin: 0.7 mg/dL (ref 0.2–1.2)
Total Protein: 7 g/dL (ref 6.1–8.1)

## 2024-01-14 LAB — LIPID PANEL
Cholesterol: 131 mg/dL (ref <200)
HDL: 63 mg/dL (ref >=50)
LDL Cholesterol (Calc): 54 mg/dL
Non-HDL Cholesterol (Calc): 68 mg/dL (ref <130)
Total CHOL/HDL Ratio: 2.1 (calc) (ref <5.0)
Triglycerides: 65 mg/dL (ref <150)

## 2024-01-15 NOTE — Progress Notes (Signed)
 Patient Care Team: Sylvan Evener, MD as PCP - General (Internal Medicine) Loyde Rule, MD as PCP - Cardiology (Cardiology)  Visit Date: 01/21/24  Subjective:   Chief Complaint  Patient presents with  . Diabetes  . Hyperlipidemia  Patient WG:Connie Hall,Female DOB:09/01/1965,57 y.o. YQM:[id number]   57 y.o.Female presents today for 6 months follow-up for Hyperlipidemia; Impaired Glucose Tolerance; Obesity. Seen for annual visit on 07/22/2023 and an acute visit on 12/10/2023 for an left axillary mass. Diagnostic mammogram was normal.   History of Hyperlipidemia treated with  Lipitor 20 mg daily. 01/14/2024 Lipid Panel: WNL. 2023 Coronary Calcium  Score: 304.   History of Impaired Glucose Tolerance treated with Ozempic  2 mg injected weekly. 01/14/2024 HgbA1c 5.4. Followed by Dr. Ned Balint, Endocrinologist who she saw in January. UTD on eye exam 12/31/2023. Microalbumin/Creatinine collected today.   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 Prior to her bypass, weighed 243 lbs on 2/23/22023, since then has lost 95 pounds, today weighing 148 lbs w/ BMI 29.89. Discussed B12 as she has heard she may become deficient due to her bypass.   Past Medical History:  Diagnosis Date  . Back pain   . Complication of anesthesia    low O2 sat  . Deviated nasal septum   . Discoid lupus   . DM (diabetes mellitus) (HCC)   . Family history of pancreatic cancer   . Family history of prostate cancer   . History of discoid lupus erythematosus   . Hyperlipidemia   . Leg edema   . Obesity   . OSA (obstructive sleep apnea)   . Restless legs syndrome (RLS)   . Vitamin D  deficiency     Allergies  Allergen Reactions  . Sulfa Antibiotics Hives  . Sulfonamide Derivatives Hives    Family History  Problem Relation Age of Onset  . Skin cancer Mother   . Hyperlipidemia Mother   . Hypertension Father   . Stroke Father   . Heart disease Father   . Diabetes Father   . Pancreatic cancer  Father 2  . Prostate cancer Father        dx in his 56s; prostatectomy  . Sleep apnea Father   . Obesity Father   . Pancreatic cancer Sister 67       neuroendocrine tumor  . Colon polyps Sister   . Colon polyps Sister   . Colon polyps Sister   . Congestive Heart Failure Maternal Aunt   . Dementia Maternal Uncle   . Lung cancer Paternal Aunt   . Heart attack Paternal Uncle   . Pancreatic cancer Paternal Uncle 36  . Testicular cancer Cousin        paternal first cousin dx in his 30s  . Colon cancer Neg Hx   . Esophageal cancer Neg Hx   . Inflammatory bowel disease Neg Hx   . Liver disease Neg Hx   . Stomach cancer Neg Hx   . Rectal cancer Neg Hx    Social History   Social History Narrative  . Not on file  Has been exercising more, doing more pilates and walking. Review of Systems  Constitutional:  Negative for fever and malaise/fatigue.  HENT:  Negative for congestion.   Eyes:  Negative for blurred vision.  Respiratory:  Negative for cough and shortness of breath.   Cardiovascular:  Negative for chest pain, palpitations and leg swelling.  Gastrointestinal:  Negative for vomiting.  Musculoskeletal:  Negative for back pain.  Skin:  Negative  for rash.  Neurological:  Negative for loss of consciousness and headaches.     Objective:  Vitals: BP 102/70   Pulse 93   Ht 4\' 11"  (1.499 m)   Wt 148 lb (67.1 kg)   LMP 02/08/2021 (Approximate)   SpO2 97%   BMI 29.89 kg/m   Physical Exam Vitals and nursing note reviewed.  Constitutional:      General: She is not in acute distress.    Appearance: Normal appearance. She is not toxic-appearing.  HENT:     Head: Normocephalic and atraumatic.  Pulmonary:     Effort: Pulmonary effort is normal.  Skin:    General: Skin is warm and dry.  Neurological:     Mental Status: She is alert and oriented to person, place, and time. Mental status is at baseline.  Psychiatric:        Mood and Affect: Mood normal.        Behavior:  Behavior normal.        Thought Content: Thought content normal.        Judgment: Judgment normal.    Results:  Studies Obtained And Personally Reviewed By Me:  Coronary Calcium  Score 07/03/2022   Ascending Aorta: Normal caliber. Aortic atherosclerosis.   Aortic Valve Calcium  score: 0   Mitral annular calcification: none   Pericardium: Normal.   Coronary arteries: Normal origins.   Coronary Calcium  Score:   Left main: 0   Left anterior descending artery: 110   Left circumflex artery: 0   Right coronary artery: 193   Total: 304   Percentile: 99th for age, sex, and race matched control.   IMPRESSION: 1. Coronary calcium  score of 304. This was 99th percentile for age, gender, and race matched controls.   2. Aortic atherosclerosis.   Non-cardiac: OVER-READ INTERPRETATION  CT CHEST    FINDINGS: No significant noncardiac vascular findings. Visualized mediastinum and hilar regions demonstrate no lymphadenopathy or masses. Visualized lungs show no evidence of pulmonary edema, consolidation, pneumothorax, nodule or pleural fluid. Visualized upper abdomen shows evidence of prior gastric surgery. Visualized bony structures are unremarkable.   IMPRESSION: No significant extracardiac findings.   Labs:     Component Value Date/Time   NA 143 07/18/2023 0944   NA 140 12/25/2017 0923   K 4.8 07/18/2023 0944   CL 106 07/18/2023 0944   CO2 28 07/18/2023 0944   GLUCOSE 81 07/18/2023 0944   BUN 11 07/18/2023 0944   BUN 14 12/25/2017 0923   CREATININE 0.66 07/18/2023 0944   CALCIUM  9.5 07/18/2023 0944   PROT 7.0 01/14/2024 1135   PROT 7.0 12/25/2017 0923   ALBUMIN 4.4 11/16/2021 1303   ALBUMIN 4.0 12/25/2017 0923   AST 21 01/14/2024 1135   ALT 28 01/14/2024 1135   ALKPHOS 50 11/16/2021 1303   BILITOT 0.7 01/14/2024 1135   BILITOT 0.3 12/25/2017 0923   GFRNONAA >60 11/21/2021 1409   GFRNONAA 78 05/13/2020 0914   GFRAA 90 05/13/2020 0914    Lab Results   Component Value Date   WBC 5.8 07/18/2023   HGB 12.7 07/18/2023   HCT 38.2 07/18/2023   MCV 91.4 07/18/2023   PLT 265 07/18/2023   Lab Results  Component Value Date   CHOL 131 01/14/2024   HDL 63 01/14/2024   LDLCALC 54 01/14/2024   TRIG 65 01/14/2024   CHOLHDL 2.1 01/14/2024   Lab Results  Component Value Date   HGBA1C 5.4 01/14/2024    Lab Results  Component Value Date  TSH 1.68 07/18/2023   Assessment & Plan:   Orders Placed This Encounter  Procedures  . Microalbumin / creatinine urine ratio  . Vitamin B12  Hyperlipidemia treated with  Lipitor 20 mg daily. 01/14/2024 Lipid Panel: WNL. 2023 Coronary Calcium  Score: 304.   Impaired Glucose Tolerance treated with Ozempic  2 mg injected weekly. 01/14/2024 HgbA1c 5.4. Followed by Dr. Ned Balint, Endocrinologist who she saw in January. UTD on eye exam 12/31/2023. Microalbumin/Creatinine collected today.   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 Prior to her bypass, weighed 243 lbs on 2/23/22023, since then has lost 95 pounds, today weighing 148 lbs w/ BMI 29.89. Discussed B12 as she has heard she may become deficient due to her bypass - ordering today.      I,Emily Lagle,acting as a Neurosurgeon for Sylvan Evener, MD.,have documented all relevant documentation on the behalf of Sylvan Evener, MD,as directed by  Sylvan Evener, MD while in the presence of Sylvan Evener, MD.   ***

## 2024-01-21 ENCOUNTER — Encounter: Payer: Self-pay | Admitting: Internal Medicine

## 2024-01-21 ENCOUNTER — Ambulatory Visit (INDEPENDENT_AMBULATORY_CARE_PROVIDER_SITE_OTHER): Admitting: Podiatry

## 2024-01-21 ENCOUNTER — Encounter: Payer: Self-pay | Admitting: Podiatry

## 2024-01-21 ENCOUNTER — Ambulatory Visit: Payer: 59 | Admitting: Internal Medicine

## 2024-01-21 VITALS — BP 102/70 | HR 93 | Ht 59.0 in | Wt 148.0 lb

## 2024-01-21 DIAGNOSIS — M2012 Hallux valgus (acquired), left foot: Secondary | ICD-10-CM

## 2024-01-21 DIAGNOSIS — R7302 Impaired glucose tolerance (oral): Secondary | ICD-10-CM | POA: Diagnosis not present

## 2024-01-21 DIAGNOSIS — E119 Type 2 diabetes mellitus without complications: Secondary | ICD-10-CM | POA: Diagnosis not present

## 2024-01-21 DIAGNOSIS — Z8249 Family history of ischemic heart disease and other diseases of the circulatory system: Secondary | ICD-10-CM

## 2024-01-21 DIAGNOSIS — Z6829 Body mass index (BMI) 29.0-29.9, adult: Secondary | ICD-10-CM

## 2024-01-21 DIAGNOSIS — M7752 Other enthesopathy of left foot: Secondary | ICD-10-CM

## 2024-01-21 DIAGNOSIS — E669 Obesity, unspecified: Secondary | ICD-10-CM

## 2024-01-21 DIAGNOSIS — Z8639 Personal history of other endocrine, nutritional and metabolic disease: Secondary | ICD-10-CM

## 2024-01-21 DIAGNOSIS — Z9889 Other specified postprocedural states: Secondary | ICD-10-CM | POA: Diagnosis not present

## 2024-01-21 DIAGNOSIS — E785 Hyperlipidemia, unspecified: Secondary | ICD-10-CM

## 2024-01-21 DIAGNOSIS — R5383 Other fatigue: Secondary | ICD-10-CM | POA: Diagnosis not present

## 2024-01-21 DIAGNOSIS — E786 Lipoprotein deficiency: Secondary | ICD-10-CM

## 2024-01-21 MED ORDER — DEXAMETHASONE SODIUM PHOSPHATE 120 MG/30ML IJ SOLN
2.0000 mg | Freq: Once | INTRAMUSCULAR | Status: AC
Start: 2024-01-21 — End: 2024-01-21
  Administered 2024-01-21: 2 mg via INTRA_ARTICULAR

## 2024-01-21 NOTE — Progress Notes (Signed)
 He presents today chief complaint of pain to the first metatarsal phalangeal joint of the left foot.  She states that she was doing really well for quite some time symptoms started back about 2 weeks ago she started aching and throbbing.  She is about to go on a trip for 2-week [place] vacation and she would like to have this injected to see if we can get any relief from her symptoms.  Current A1c is at 5.3.  Objective: Vital signs stable oriented x 3 there is no erythema Dem salines drainage or she has mild fluctuance around the hypertrophic medial condyle of the head of the first metatarsal with some fluctuance on palpation.  She also has some tenderness on palpation of the tibial sesamoid left.  No crepitation on range of motion no pain on range of motion.  Assessment: Capsulitis bursitis first metatarsophalangeal joint left foot.  Plan: I injected the area today 6 mg of dexamethasone  and local anesthetic.  Follow-up with her on an as-needed basis.

## 2024-01-21 NOTE — Addendum Note (Signed)
 Addended by: Pheobe Brass E on: 01/21/2024 10:06 AM   Modules accepted: Orders, Level of Service

## 2024-01-22 ENCOUNTER — Ambulatory Visit: Payer: Self-pay | Admitting: Internal Medicine

## 2024-01-22 LAB — VITAMIN B12: Vitamin B-12: 968 pg/mL (ref 200–1100)

## 2024-01-22 LAB — MICROALBUMIN / CREATININE URINE RATIO
Creatinine, Urine: 105 mg/dL (ref 20–275)
Microalb Creat Ratio: 2 mg/g{creat} (ref <30)
Microalb, Ur: 0.2 mg/dL

## 2024-01-24 ENCOUNTER — Encounter: Payer: Self-pay | Admitting: Internal Medicine

## 2024-01-24 NOTE — Patient Instructions (Addendum)
 It was a pleasure to see you today. B12 level drawn and result is normal. CPE due in 6 months. Hgb AIC is excellent as is lipid panel. No change in medications.

## 2024-03-02 ENCOUNTER — Other Ambulatory Visit: Payer: Self-pay | Admitting: Internal Medicine

## 2024-03-02 ENCOUNTER — Other Ambulatory Visit (HOSPITAL_COMMUNITY): Payer: Self-pay

## 2024-03-02 ENCOUNTER — Other Ambulatory Visit: Payer: Self-pay

## 2024-03-02 MED ORDER — OZEMPIC (2 MG/DOSE) 8 MG/3ML ~~LOC~~ SOPN
2.0000 mg | PEN_INJECTOR | SUBCUTANEOUS | 3 refills | Status: AC
  Filled 2024-03-02: qty 9, 84d supply, fill #0
  Filled 2024-06-01: qty 9, 84d supply, fill #1
  Filled 2024-08-20: qty 9, 84d supply, fill #2

## 2024-03-02 NOTE — Telephone Encounter (Signed)
 Refill request complete

## 2024-03-03 ENCOUNTER — Other Ambulatory Visit: Payer: Self-pay

## 2024-03-04 ENCOUNTER — Other Ambulatory Visit (HOSPITAL_COMMUNITY): Payer: Self-pay

## 2024-03-04 ENCOUNTER — Other Ambulatory Visit: Payer: Self-pay

## 2024-03-04 MED ORDER — ROPINIROLE HCL 0.5 MG PO TABS
ORAL_TABLET | ORAL | 1 refills | Status: DC
  Filled 2024-03-04: qty 270, 90d supply, fill #0
  Filled 2024-06-01: qty 270, 90d supply, fill #1

## 2024-03-05 ENCOUNTER — Other Ambulatory Visit: Payer: Self-pay

## 2024-03-05 ENCOUNTER — Other Ambulatory Visit (HOSPITAL_COMMUNITY): Payer: Self-pay

## 2024-03-05 ENCOUNTER — Encounter: Payer: Self-pay | Admitting: Internal Medicine

## 2024-03-05 ENCOUNTER — Ambulatory Visit: Payer: 59 | Admitting: Internal Medicine

## 2024-03-05 VITALS — BP 118/74 | HR 85 | Ht 59.0 in | Wt 145.6 lb

## 2024-03-05 DIAGNOSIS — Z7985 Long-term (current) use of injectable non-insulin antidiabetic drugs: Secondary | ICD-10-CM | POA: Diagnosis not present

## 2024-03-05 DIAGNOSIS — E1165 Type 2 diabetes mellitus with hyperglycemia: Secondary | ICD-10-CM

## 2024-03-05 DIAGNOSIS — G4733 Obstructive sleep apnea (adult) (pediatric): Secondary | ICD-10-CM | POA: Diagnosis not present

## 2024-03-05 DIAGNOSIS — E785 Hyperlipidemia, unspecified: Secondary | ICD-10-CM

## 2024-03-05 DIAGNOSIS — E663 Overweight: Secondary | ICD-10-CM

## 2024-03-05 MED ORDER — FREESTYLE LIBRE 3 PLUS SENSOR MISC
1.0000 | 3 refills | Status: DC
  Filled 2024-03-05: qty 6, 90d supply, fill #0
  Filled 2024-03-05: qty 2, 30d supply, fill #1
  Filled 2024-03-05: qty 1, 15d supply, fill #0

## 2024-03-05 NOTE — Patient Instructions (Addendum)
 Please decrease: - Ozempic  1.5 mg weekly  72 clicks = 2 mg  54 clicks = 1.5 mg 36 clicks = 1 mg  Please return in 6 months.

## 2024-03-05 NOTE — Progress Notes (Signed)
 Patient ID: Connie Hall, female   DOB: 1966-05-18, 58 y.o.   MRN: [id number]  HPI: Malayshia All is a 58 y.o.-year-old female, referred by her PCP, Dr. Perri, for management of DM2, dx at least in 2012, without long-term complications and also obesity.  Last visit 6 months ago.  Interim history: She had gastric bypass (R en Y) surgery In 10/2021. She lost >100 pounds afterwards.  She lost 13 pounds since last visit. No increased urination, blurry vision, nausea, chest pain.   She feels great, has good energy. She started Pilates 2.5 years ago - continue once a day and she also exercises at home.  Reviewed history: She was seen in the weight management clinic by Dr. Verdon. Also tried Weight watchers, Optivia, other meal-replacement plans, w/o good results. She gained ~50 lbs in 5 years.  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  Reviewed HbA1c levels: Lab Results  Component Value Date   HGBA1C 5.4 01/14/2024   HGBA1C 5.5 07/18/2023   HGBA1C 5.6 12/20/2022   HGBA1C 5.4 09/06/2022   HGBA1C 5.7 (H) 06/14/2022   HGBA1C 5.3 03/01/2022   HGBA1C 6.1 (H) 11/16/2021   HGBA1C 5.8 (H) 06/06/2021   HGBA1C 6.2 (A) 01/26/2021   HGBA1C 7.1 (A) 09/27/2020   HGBA1C 6.5 (H) 05/13/2020   HGBA1C 7.0 (A) 02/05/2020   HGBA1C 6.7 (H) 04/21/2019   HGBA1C 6.4 (H) 09/09/2018   HGBA1C 5.8 (H) 03/11/2018   HGBA1C 6.2 (H) 12/25/2017   HGBA1C 6.0 (H) 08/08/2017   HGBA1C 6.1 (H) 02/07/2017   HGBA1C 6.1 (H) 07/26/2016   HGBA1C 6.4 (H) 02/02/2016   She is on: - Ozempic  0.25 >> 0.5 >> 1 >> 2 mg weekly She was previously on metformin , stopped 02/2022. She was off Ozempic  for a period of time but her HbA1c increased and she restarted it.  Pt checks her sugars 4 times a week with her CGM-she only used it for the last 2 weeks as it is usually expensive:  Previously: - am:  70s-100 - 2h after b'fast: n/c - lunch: n/c - 2h after lunch: 120-150 - dinner: n/c - 2h after dinner:  120-150 - bedtime: n/c  Previously:   Lowest sugar was 80 >> 70 >> 60s >> 40; it is unclear at which level she has hypoglycemia awareness. Highest sugar was 110 >> 100 >> 200s >> 190s (doughnut)  -No CKD, last BUN/creatinine:  Lab Results  Component Value Date   BUN 11 07/18/2023   BUN 19 06/14/2022   CREATININE 0.66 07/18/2023   CREATININE 0.71 06/14/2022   Lab Results  Component Value Date   MICRALBCREAT 2 01/21/2024   MICRALBCREAT 12 12/20/2022   MICRALBCREAT 4 06/06/2021   MICRALBCREAT 9 05/13/2020   MICRALBCREAT 8 09/09/2018   MICRALBCREAT 7 03/11/2018   MICRALBCREAT 3 08/08/2017   MICRALBCREAT 12 02/07/2017   MICRALBCREAT 7.1 01/20/2015  She is not on ACE inhibitor or ARB.  -+ Dyslipidemia; last set of lipids: Lab Results  Component Value Date   CHOL 131 01/14/2024   HDL 63 01/14/2024   LDLCALC 54 01/14/2024   TRIG 65 01/14/2024   CHOLHDL 2.1 01/14/2024  On Lipitor 10.  - last eye exam was on 12/31/2023: No DR. She has a history of retinal holes. Dr. Kennyth.  - she denies numbness and tingling in her feet. Last diabetic foot exam 02/2023.  She saw podiatry (Dr. Verta) 1.5 months ago but a diabetic foot exam was unfortunately not done at that time. She  had capsulitis of the first metatarsophalangeal joint of the left foot and had a steroid injection.  She was advised to return to podiatry prn.  Pt has FH of DM in father (died of pancreatic cancer), 4 sisters. No h/o pancreatitis or FH of MTC.  She has a history of OSA.  ROS: + see HPI  I reviewed pt's medications, allergies, PMH, social hx, family hx, and changes were documented in the history of present illness. Otherwise, unchanged from my initial visit note.  Past Medical History:  Diagnosis Date   Back pain    Complication of anesthesia    low O2 sat   Deviated nasal septum    Discoid lupus    DM (diabetes mellitus) (HCC)    Family history of pancreatic cancer    Family history of prostate cancer     History of discoid lupus erythematosus    Hyperlipidemia    Leg edema    Obesity    OSA (obstructive sleep apnea)    Restless legs syndrome (RLS)    Vitamin D  deficiency    Past Surgical History:  Procedure Laterality Date   BIOPSY  07/24/2021   Procedure: BIOPSY;  Surgeon: Wilhelmenia Aloha Raddle., MD;  Location: [facility];  Service: Gastroenterology;;   BREAST BIOPSY Right 02/27/2023   MM RT BREAST BX W LOC DEV 1ST LESION IMAGE BX SPEC STEREO GUIDE 02/27/2023 [facility]   COLONOSCOPY WITH PROPOFOL  N/A 07/24/2021   Procedure: COLONOSCOPY WITH PROPOFOL ;  Surgeon: Wilhelmenia Aloha Raddle., MD;  Location: [facility];  Service: Gastroenterology;  Laterality: N/A;   ESOPHAGOGASTRODUODENOSCOPY (EGD) WITH PROPOFOL  N/A 07/24/2021   Procedure: ESOPHAGOGASTRODUODENOSCOPY (EGD) WITH PROPOFOL ;  Surgeon: Wilhelmenia Aloha Raddle., MD;  Location: [facility];  Service: Gastroenterology;  Laterality: N/A;   POLYPECTOMY  07/24/2021   Procedure: POLYPECTOMY;  Surgeon: Wilhelmenia Aloha Raddle., MD;  Location: [facility];  Service: Gastroenterology;;   ROTATOR CUFF REPAIR  2009   left   Sebaceous cyst removal, back  03/27/2008   XI ROBOTIC ASSISTED HIATAL HERNIA REPAIR N/A 11/21/2021   Procedure: XI ROBOTIC ASSISTED HIATAL HERNIA REPAIR;  Surgeon: Lyndel Deward PARAS, MD;  Location: [facility];  Service: General;  Laterality: N/A;   Social History   Socioeconomic History   Marital status: Single    Spouse name: Not on file   Number of children: 0   Years of education: Not on file   Highest education level: Bachelor's degree (e.g., BA, AB, BS)  Occupational History   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    Employer:   Tobacco Use   Smoking status: Former    Current packs/day: 0.00    Average packs/day: 1 pack/day for 15.0 years (15.0 ttl pk-yrs)    Types: Cigarettes    Start date: 08/28/1983    Quit date: 08/27/1998    Years since quitting: 25.5   Smokeless tobacco: Never   Vaping Use   Vaping status: Never Used  Substance and Sexual Activity   Alcohol use: Yes    Comment: rare   Drug use: No   Sexual activity: Not on file  Other Topics Concern   Not on file  Social History Narrative   Not on file   Social Drivers of Health   Financial Resource Strain: Low Risk  (12/06/2023)   Overall Financial Resource Strain (CARDIA)    Difficulty of Paying Living Expenses: Not hard at all  Food Insecurity: No Food Insecurity (12/06/2023)   Hunger Vital Sign    Worried About  Running Out of Food in the Last Year: Never true    Ran Out of Food in the Last Year: Never true  Transportation Needs: No Transportation Needs (12/06/2023)   PRAPARE - Administrator, Civil Service (Medical): No    Lack of Transportation (Non-Medical): No  Physical Activity: Sufficiently Active (12/06/2023)   Exercise Vital Sign    Days of Exercise per Week: 4 days    Minutes of Exercise per Session: 40 min  Stress: No Stress Concern Present (12/06/2023)   [employer] of Occupational Health - Occupational Stress Questionnaire    Feeling of Stress : Only a little  Social Connections: Moderately Integrated (12/06/2023)   Social Connection and Isolation Panel    Frequency of Communication with Friends and Family: More than three times a week    Frequency of Social Gatherings with Friends and Family: More than three times a week    Attends Religious Services: More than 4 times per year    Active Member of [employer] or Organizations: Yes    Attends Engineer, structural: More than 4 times per year    Marital Status: Never married  Catering manager Violence: Not on file   Current Outpatient Medications on File Prior to Visit  Medication Sig Dispense Refill   ALLEGRA-D ALLERGY & CONGESTION 60-120 MG 12 hr tablet TAKE 1 TABLET BY MOUTH TWICE A DAY 180 tablet 2   atorvastatin  (LIPITOR) 20 MG tablet Take 1 tablet (20 mg total) by mouth daily. 90 tablet 3   Calcium   Carb-Cholecalciferol (CALCIUM  600+D) 600-10 MG-MCG TABS Take by mouth 4 (four) times daily -  before meals and at bedtime.     Cholecalciferol (VITAMIN D3) 125 MCG (5000 UT) CAPS Take 5,000 Units by mouth daily.     Continuous Glucose Sensor (FREESTYLE LIBRE 3 PLUS SENSOR) MISC 1 each by Does not apply route every 14 (fourteen) days. 6 each 3   fluticasone  (FLONASE ) 50 MCG/ACT nasal spray Place 2 sprays into both nostrils daily. 16 g PRN   glucose blood test strip Use as instructed 1x a day - for One Touch Verio Reflect 100 each 3   Multiple Vitamin (MULTIVITAMIN) tablet Take 1 tablet by mouth daily.     OneTouch Delica Lancets 33G MISC Use 2x a day - for One Touch Verio Reveal 100 each 3   rOPINIRole  (REQUIP ) 0.5 MG tablet Take 1 tablet (0.5 mg total) by mouth daily in the afternoon AND 2 tablets (1 mg total) at bedtime. 270 tablet 1   Semaglutide , 2 MG/DOSE, (OZEMPIC , 2 MG/DOSE,) 8 MG/3ML SOPN Inject 2 mg into the skin once a week. 9 mL 3   No current facility-administered medications on file prior to visit.   Allergies  Allergen Reactions   Sulfa Antibiotics Hives   Sulfonamide Derivatives Hives   Family History  Problem Relation Age of Onset   Skin cancer Mother    Hyperlipidemia Mother    Hypertension Father    Stroke Father    Heart disease Father    Diabetes Father    Pancreatic cancer Father 41   Prostate cancer Father        dx in his 40s; prostatectomy   Sleep apnea Father    Obesity Father    Pancreatic cancer Sister 44       neuroendocrine tumor   Colon polyps Sister    Colon polyps Sister    Colon polyps Sister    Congestive Heart Failure Maternal Aunt  Dementia Maternal Uncle    Lung cancer Paternal Aunt    Heart attack Paternal Uncle    Pancreatic cancer Paternal Uncle 80   Testicular cancer Cousin        paternal first cousin dx in his 47s   Colon cancer Neg Hx    Esophageal cancer Neg Hx    Inflammatory bowel disease Neg Hx    Liver disease Neg Hx     Stomach cancer Neg Hx    Rectal cancer Neg Hx    PE: BP 118/74   Pulse 85   Ht 4' 11 (1.499 m)   Wt 145 lb 9.6 oz (66 kg)   LMP 02/08/2021 (Approximate)   SpO2 98%   BMI 29.41 kg/m  Wt Readings from Last 3 Encounters:  03/05/24 145 lb 9.6 oz (66 kg)  01/21/24 148 lb (67.1 kg)  09/05/23 158 lb 3.2 oz (71.8 kg)   Constitutional: overweight, in NAD Eyes: EOMI, no exophthalmos ENT: no thyromegaly, no cervical lymphadenopathy Cardiovascular: RRR, No MRG, + pitting LE edema B Respiratory: CTA B Musculoskeletal: no deformities Skin: no rashes Neurological: no tremor with outstretched hands Diabetic Foot Exam - Simple   Simple Foot Form Diabetic Foot exam was performed with the following findings: Yes 03/05/2024  3:48 PM  Visual Inspection No deformities, no ulcerations, no other skin breakdown bilaterally: Yes Sensation Testing Intact to touch and monofilament testing bilaterally: Yes Pulse Check Posterior Tibialis and Dorsalis pulse intact bilaterally: Yes Comments + B pitting edema    ASSESSMENT: 1.  Type 2 diabetes, non-insulin -dependent,  without complications  2.  Overweight  3. HL  PLAN:  1. Patient with history of prediabetes but a new diagnosis of diabetes in 2021, when HbA1c returned 7.0%.  She continues on weekly GLP-1 receptor agonist, with excellent blood sugars.  She was previously on Dexcom but she came up due to cost.  I suggested a libre 3+ CGM.  She was not checking sugars consistently at that time.  We did not change the regimen otherwise.  Latest HbA1c is from 1.5 months ago: 5.4%, at goal, decreased from 5.5% at last visit. CGM interpretation: -At today's visit, we reviewed her CGM downloads: It appears that 89% of values are in target range (goal >70%), while 3% are higher than 180 (goal <25%), and 8% are lower than 70 (goal <4%).  The calculated average blood sugar is 97.  The projected HbA1c for the next 3 months (GMI) is 5.6%. -Reviewing the CGM  trends, sugars appear to be fluctuating mainly within the target range but they are quite low between meals and she still has hyperglycemic spikes after certain meals, especially with high glycemic index.  We discussed about the importance of stopping high glycemic index meals, like the nuts, but also other processed foods like crackers and chips, which are causing a higher blood sugar spike.  To avoid the lows, I also recommended to decrease the Ozempic  dose to 1.5 mg weekly.  We discussed about how to dial up this dose on the 2 mg pen. I recommended to try this dose for a month, and if she is doing well with it, to dial down the dose to 1 mg weekly.  She agrees with this plan. - I suggested to:  Patient Instructions  Please decrease: - Ozempic  1.5 mg weekly  72 clicks = 2 mg  54 clicks = 1.5 mg 36 clicks = 1 mg  Please return in 6 months.  - advised to check sugars  at different times of the day - 1x a day, rotating check times - advised for yearly eye exams >> she is UTD - return to clinic in 6 months  2.  Overweight - Continues on Ozempic .  We did discuss in the past about the possibility of decreasing her Ozempic  dose but did not do so yet as she was still above her target weight (140 pounds) at last visit. -She initially lost 60 pounds after her gastric bypass surgery and then lost ~40 more before last visit - Since last visit, she lost another 13 pounds - will start decreasing her Ozempic  dose now  3. HL - Latest lipid panel was reviewed from 12/2023: Fractions at goal: Lab Results  Component Value Date   CHOL 131 01/14/2024   HDL 63 01/14/2024   LDLCALC 54 01/14/2024   TRIG 65 01/14/2024   CHOLHDL 2.1 01/14/2024  -She is on Lipitor 20 mg daily without side effects  Lela Fendt, MD PhD [facility] Endocrinology

## 2024-03-06 ENCOUNTER — Other Ambulatory Visit: Payer: Self-pay

## 2024-03-09 ENCOUNTER — Other Ambulatory Visit: Payer: Self-pay

## 2024-03-10 ENCOUNTER — Encounter: Payer: Self-pay | Admitting: Skilled Nursing Facility1

## 2024-03-10 ENCOUNTER — Encounter: Attending: Surgery | Admitting: Skilled Nursing Facility1

## 2024-03-10 DIAGNOSIS — E669 Obesity, unspecified: Secondary | ICD-10-CM | POA: Insufficient documentation

## 2024-03-10 NOTE — Progress Notes (Unsigned)
 Bariatric Nutrition Follow-Up Visit Medical Nutrition Therapy    NUTRITION ASSESSMENT    Anthropometrics  Surgery date: 11/21/2021 Surgery type: RYGB Start weight at [facility]: 251.4 Weight today: 146 pounds   Body Composition Scale 04/16/2022 09/05/2022 05/08/2023 03/10/2024  Current Body Weight 186.1 174.3 163.6 146  Total Body Fat % 42.6 40.8 39.2 35.5  Visceral Fat 15 13 12 10   Fat-Free Mass % 57.3 59.1 60.7 64.4   Total Body Water  % 43.1 44 44.8 46.7  Muscle-Mass lbs 26 25.9 25.6 25.5  BMI 37.6 35.1 33 29.4  Body Fat Displacement             Torso  lbs 49 43.9 39.5 31.9         Left Leg  lbs 9.8 8.7 7.9 6.3         Right Leg  lbs 9.8 8.7 7.9 6.3         Left Arm  lbs 4.9 4.3 3.9 3.1         Right Arm   lbs 4.9 4.3 3.9 3.1   Clinical  Medical hx: DM, hyperlipemia, discoid lupus Medications: ozempic , metformin   Labs: A1C 5.6 Notable signs/symptoms: N/A Any previous deficiencies? Vitamin D , Low iron   Lifestyle & Dietary Hx  Pt states she is very active, travels all over the [place]  and the world and has plenty of energy to do it all!   Estimated daily fluid intake: 32+ oz Estimated daily protein intake: 40-60 g Supplements: multi, calcium  Current average weekly physical activity: walking a mile or two four times a week, palates an hour once a week.  Pt states piliates is great: walking and kyaking and now golfing   24-Hr Dietary Recall: 1-2 pieces of fruit daily First Meal: chaboni smoothie drink  Snack:  2 pieces cheese + handful tricuits Second Meal: salad with chicken, eggs, cheese + thousand  Snack: nuts or chicken meat sticks Third Meal: salad with olives and nuts and chicken Snack:  Beverages: water , gatorade zero, water  flavorings  Post-Op Goals/ Signs/ Symptoms Using straws: no Drinking while eating: no Chewing/swallowing difficulties: no Changes in vision: no Changes to mood/headaches: no Hair loss/changes to skin/nails: no Difficulty  focusing/concentrating: no Sweating: no Limb weakness: no Dizziness/lightheadedness: no Palpitations: no  Carbonated/caffeinated beverages: no N/V/D/C/Gas: no Abdominal pain: no Dumping syndrome: no    NUTRITION DIAGNOSIS  Overweight/obesity ([place]-3.3) related to past poor dietary habits and physical inactivity as evidenced by completed bariatric surgery and following dietary guidelines for continued weight loss and healthy nutrition status.     NUTRITION INTERVENTION: continued  Nutrition counseling (C-1) and education (E-2) to facilitate bariatric surgery goals, including:  The importance of consuming adequate calories as well as certain nutrients daily due to the body's need for essential vitamins, minerals, and fats The importance of daily physical activity and to reach a goal of at least 150 minutes of moderate to vigorous physical activity weekly (or as directed by their physician) due to benefits such as increased musculature and improved lab values The importance of intuitive eating specifically learning hunger-satiety cues and understanding the importance of learning a new body: The importance of mindful eating to avoid grazing behaviors  Encouraged patient to honor their body's internal hunger and fullness cues.  Throughout the day, check in mentally and rate hunger. Stop eating when satisfied not full regardless of how much food is left on the plate.  Get more if still hungry 20-30 minutes later.  The key is to honor satisfaction  so throughout the meal, rate fullness factor and stop when comfortably satisfied not physically full. The key is to honor hunger and fullness without any feelings of guilt or shame.  Pay attention to what the internal cues are, rather than any external factors. This will enhance the confidence you have in listening to your own body and following those internal cues enabling you to increase how often you eat when you are hungry not out of appetite and stop when  you are satisfied not full.  Encouraged pt to continue to eat balanced meals inclusive of non starchy vegetables 2 times a day 7 days a week Encouraged pt to choose lean protein sources: limiting beef, pork, sausage, hotdogs, and lunch meat Encourage pt to choose healthy fats such as plant based limiting animal fats Encouraged pt to continue to drink a minium 64 fluid ounces with half being plain water  to satisfy proper hydration  Creation of balanced and diverse meals to increase the intake of nutrient-rich foods that provide essential vitamins, minerals, fiber, and phytonutrients  Variety of Fruits and Vegetables:  Aim for a colorful array of fruits and vegetables to ensure a wide range of nutrients. Include a mix of leafy greens, berries, citrus fruits, cruciferous vegetables, and more. Whole Grains: Choose whole grains over refined grains. Examples include brown rice, quinoa, oats, whole wheat, and barley. Lean Proteins: Include lean sources of protein, such as poultry, fish, tofu, legumes, beans, lentils, and low-fat dairy products. Limit red and processed meats. Healthy Fats: Incorporate sources of healthy fats, including avocados, nuts, seeds, and olive oil. Limit saturated and trans fats found in fried and processed foods. Dairy or Dairy Alternatives: Choose low-fat or fat-free dairy products, or plant-based alternatives like almond or soy milk. Portion Control: Be mindful of portion sizes to avoid overeating. Pay attention to hunger and satisfaction cues. Limit Added Sugars: Minimize the consumption of sugary beverages, snacks, and desserts. Check food labels for added sugars and opt for natural sources of sweetness such as whole fruits. Hydration: Drink plenty of water  throughout the day. Limit sugary drinks and excessive caffeine intake. Moderate Sodium Intake: Reduce the consumption of high-sodium foods. Use herbs and spices for flavor instead of excessive salt. Meal  Planning and Preparation: Plan and prepare meals ahead of time to make healthier choices more convenient. Include a mix of food groups in each meal. Limit Processed Foods: Minimize the intake of highly processed and packaged foods that are often high in added sugars, salt, and unhealthy fats. Regular Physical Activity: Combine a healthy diet with regular physical activity for overall well-being. Aim for at least 150 minutes of moderate-intensity aerobic exercise per week, along with strength training. Moderation and Balance: Enjoy treats and indulgent foods in moderation, emphasizing balance rather than strict restriction.  Ensure you are eating every 3-5 hours daily to ensure your weight and muscle mass is stable without atrophy.    Learning Style & Readiness for Change Teaching method utilized: Visual & Auditory  Demonstrated degree of understanding via: Teach Back  Readiness Level: action Barriers to learning/adherence to lifestyle change: none identified   RD's Notes for Next Visit Assess adherence to pt chosen goals    MONITORING & EVALUATION Dietary intake, weekly physical activity, body weight  Next Steps Patient is to follow-up in 6 months

## 2024-03-11 ENCOUNTER — Other Ambulatory Visit (HOSPITAL_COMMUNITY): Payer: Self-pay

## 2024-03-11 ENCOUNTER — Telehealth: Payer: Self-pay | Admitting: Pharmacy Technician

## 2024-03-11 NOTE — Telephone Encounter (Signed)
 Pharmacy Patient Advocate Encounter   Received notification from Onbase that prior authorization for FreeStyle Libre 3 Plus Sensor is required/requested.   Insurance verification completed.   The patient is insured through [facility] .   Prior Authorization form/request asks a question that requires your assistance. Please see the question below and advise accordingly. The PA will not be submitted until the necessary information is received.   **PA will be denied if there are no chart notes that show either the patient takes insulin , or she has frequent hypoglycemic events or unawareness. I could not find supporting chart notes. Does the patient have either one of these?**

## 2024-03-16 ENCOUNTER — Encounter (HOSPITAL_BASED_OUTPATIENT_CLINIC_OR_DEPARTMENT_OTHER): Payer: Self-pay | Admitting: Pulmonary Disease

## 2024-03-16 ENCOUNTER — Ambulatory Visit (HOSPITAL_BASED_OUTPATIENT_CLINIC_OR_DEPARTMENT_OTHER): Admitting: Pulmonary Disease

## 2024-03-16 VITALS — BP 102/63 | HR 78 | Ht 59.0 in | Wt 143.0 lb

## 2024-03-16 DIAGNOSIS — G4733 Obstructive sleep apnea (adult) (pediatric): Secondary | ICD-10-CM

## 2024-03-16 NOTE — Progress Notes (Signed)
   Subjective:    Patient ID: Connie Hall, female    DOB: July 18, 1966, 58 y.o.   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  History of Present Illness Connie Hall is a 58 year old female with sleep apnea and restless leg syndrome who presents for follow-up of her CPAP therapy and restless leg symptoms.  She has lost 112 pounds, allowing her to adjust her CPAP pressure to 6.5. Her sleep quality has improved significantly, and she feels great, describing the CPAP as her 'blanket'. She uses a [employer] for travel and requests a prescription for supplies for this device.  She continues to experience symptoms of restless leg syndrome despite trying magnesium. She takes ropinirole , typically two low-dose tablets at night, and occasionally a half tablet during the day if symptoms are severe. Her father and four sisters also have restless leg syndrome. She monitors her iron levels regularly and takes a multivitamin with iron due to her history of bariatric surgery. Her recent blood work, including B12 levels, is within normal ranges. Her restless leg symptoms are manageable with her current medication regimen.   DL shows pr 7 cm, no residual events, great compliance >7h/ night    Significant tests/ events reviewed NPSG 2011:  AHI 44/hr  Review of Systems  neg for any significant sore throat, dysphagia, itching, sneezing, nasal congestion or excess/ purulent secretions, fever, chills, sweats, unintended wt loss, pleuritic or exertional cp, hempoptysis, orthopnea pnd or change in chronic leg swelling. Also denies presyncope, palpitations, heartburn, abdominal pain, nausea, vomiting, diarrhea or change in bowel or urinary habits, dysuria,hematuria, rash, arthralgias, visual  complaints, headache, numbness weakness or ataxia.      Objective:   Physical Exam  Gen. Pleasant, obese, in no distress ENT - no lesions, no post nasal drip Neck: No JVD, no thyromegaly, no carotid bruits Lungs: no use of accessory muscles, no dullness to percussion, decreased without rales or rhonchi  Cardiovascular: Rhythm regular, heart sounds  normal, no murmurs or gallops, no peripheral edema Musculoskeletal: No deformities, no cyanosis or clubbing , no tremors       Assessment & Plan:   Assessment and Plan Assessment & Plan Obstructive Sleep Apnea Obstructive sleep apnea is well-managed with CPAP therapy. Significant weight loss of 112 pounds may have reduced the need for high CPAP pressure. Current CPAP pressure is set at 6.5, and apnea events are minimal. She prefers to continue using CPAP despite potential reduced need due to weight loss. No new sleep study is desired. - Continue CPAP therapy at 7 cm pressure. - Prescribe [employer] supplies for travel use. - Update prescription for CPAP supplies through [facility].  Restless Legs Syndrome Restless legs syndrome persists with symptoms managed by ropinirole . She takes two low-dose tablets at night and occasionally a half tablet during the day. Magnesium supplementation was ineffective. Iron levels and B12 are monitored regularly, and blood work is within normal range. Current guidelines suggest reducing reliance on ropinirole  due to potential augmentation, but she is on a low dose and it is effective for her. - Continue current ropinirole  regimen as prescribed by Dr. Perri. - Monitor iron levels and B12 regularly.

## 2024-03-16 NOTE — Patient Instructions (Signed)
  VISIT SUMMARY: Today, we discussed your progress with CPAP therapy for sleep apnea and your ongoing management of restless leg syndrome. You have successfully lost 112 pounds, which has allowed you to adjust your CPAP pressure to 6.5, significantly improving your sleep quality. We also reviewed your current treatment for restless leg syndrome, which includes ropinirole  and regular monitoring of your iron and B12 levels.  YOUR PLAN: -OBSTRUCTIVE SLEEP APNEA: Obstructive sleep apnea is a condition where your airway becomes blocked during sleep, causing breathing pauses. Your condition is well-managed with CPAP therapy at a pressure of 6.5, and your sleep quality has improved. We will continue your current CPAP therapy and prescribe supplies for your [employer] for travel. Your prescription for CPAP supplies will be updated through [facility].  -RESTLESS LEGS SYNDROME: Restless legs syndrome is a condition that causes an uncontrollable urge to move your legs, usually due to an uncomfortable sensation. Your symptoms are managed with ropinirole , which you take at night and occasionally during the day. We will continue your current medication regimen and regularly monitor your iron and B12 levels to ensure they remain within normal ranges.  INSTRUCTIONS: Continue using your CPAP therapy at a pressure of 6.5 and follow up with [facility] for your updated CPAP supplies prescription. Maintain your current ropinirole  regimen for restless leg syndrome and keep monitoring your iron and B12 levels regularly.                      Contains text generated by Abridge.                                 Contains text generated by Abridge.

## 2024-06-01 ENCOUNTER — Other Ambulatory Visit: Payer: Self-pay | Admitting: Internal Medicine

## 2024-06-01 ENCOUNTER — Other Ambulatory Visit: Payer: Self-pay

## 2024-06-01 ENCOUNTER — Other Ambulatory Visit (HOSPITAL_COMMUNITY): Payer: Self-pay

## 2024-06-01 MED ORDER — ATORVASTATIN CALCIUM 20 MG PO TABS
20.0000 mg | ORAL_TABLET | Freq: Every day | ORAL | 3 refills | Status: AC
  Filled 2024-06-01: qty 90, 90d supply, fill #0
  Filled 2024-08-20: qty 90, 90d supply, fill #1

## 2024-06-11 ENCOUNTER — Other Ambulatory Visit (HOSPITAL_COMMUNITY): Payer: Self-pay

## 2024-06-29 ENCOUNTER — Encounter: Payer: Self-pay | Admitting: Radiology

## 2024-07-17 NOTE — Progress Notes (Signed)
 "  Annual Comprehensive Physical Exam   Patient Care Team: Otho Michalik, Ronal PARAS, MD as PCP - General (Internal Medicine) Delford Maude BROCKS, MD as PCP - Cardiology (Cardiology)  Visit Date: 07/28/24   Chief Complaint  Patient presents with   Annual Exam   Subjective:  Patient: Connie Hall, Female DOB: 1966-05-02, 58 y.o. MRN: [id number] Vitals:   07/28/24 1446  BP: 110/80   Connie Hall is a 58 y.o. Female who presents today for her Annual Comprehensive Physical Exam. Patient has Vitamin D  deficiency; Hyperlipidemia; Obstructive sleep apnea; RESTLESS LEG SYNDROME; Allergic rhinitis; LUPUS ERYTHEMATOSUS, DISCOID; HYPERSOMNIA; Carpal tunnel syndrome; Dependent edema; Class 3 obesity (HCC); Family history of pancreatic cancer; Family history of prostate cancer; Genetic testing; Impaired glucose tolerance; Encounter for preoperative examination for general surgical procedure; Morbid obesity (HCC); Colon cancer screening; and Complication of anesthesia on their problem list.  History of Diabetes, Mellitus  Type II treated with Ozempic  2 mg weekly. 07/21/2024 HgbA1c 5.4%  History of  Hyperlipidemia treated with Atorvastatin  20 mg daily. 07/21/2024 Lipid panel normal.    History of restless legs syndrome treated with Requip  0.5 mg in the afternoon and 1 mg at bedtime.   She saw Dr. Cloretta in 2018 to discuss genetics with family history of pancreatic cancer.  Genetic screening was negative.  Father and paternal uncle died of pancreatic cancer.  Sister was diagnosed with a neuroendocrine tumor at the tail of the pancreas in 2017.   Patient is allergic to Sulfa-it causes hives   Seen by Dr. Jude for sleep apnea in September 2023.  CPAP is set at 9 cm.  Home sleep service is by [facility].  Benign breast biopsy 2024.right breast sclerosing adenosis  Labs 07/21/2024 WNL   12/26/2023 Mammogram No mammographic or sonographic abnormality seen in the left axilla at site of patient's pain. Return to  routine screening mammogram which will be due in July of 2025.   07/24/2021 Colonoscopy Hemorrhoids found on digital rectal exam. The examined portion of the ileum was normal. Six, 2 to 8 mm polyps in the rectum, at the recto- sigmoid colon and in the cecum. Resected and retrieved. Pathology found to be tubular adenoma and hyperplastic polyp.  Diverticulosis in the recto- sigmoid colon, in the sigmoid colon and in the descending colon. Normal mucosa in the entire examined colon otherwise. Non- bleeding non- thrombosed external and internal hemorrhoids. Repeat in 5 years    Vaccine counseling: Covid-19 vaccine due. Hep B vaccine discontinued.  Health Maintenance  Topic Date Due   Cervical Cancer Screening (HPV/Pap Cotest)  02/07/2019   COVID-19 Vaccine (5 - 2025-26 season) 04/27/2024   Hepatitis B Vaccines 19-59 Average Risk (1 of 3 - 19+ 3-dose series) 07/28/2025 (Originally 03/23/1987)   OPHTHALMOLOGY EXAM  12/30/2024   HEMOGLOBIN A1C  01/18/2025   Mammogram  02/19/2025   FOOT EXAM  03/05/2025   Diabetic kidney evaluation - eGFR measurement  07/21/2025   Diabetic kidney evaluation - Urine ACR  07/21/2025   DTaP/Tdap/Td (3 - Td or Tdap) 03/04/2026   Colonoscopy  07/25/2031   Pneumococcal Vaccine: 50+ Years  Completed   Influenza Vaccine  Completed   Zoster Vaccines- Shingrix  Completed   HPV VACCINES  Aged Out   Meningococcal B Vaccine  Aged Out   Hepatitis C Screening  Discontinued   HIV Screening  Discontinued    Review of Systems  Constitutional:  Negative for fever and malaise/fatigue.  HENT:  Negative for congestion.   Eyes:  Negative  for blurred vision.  Respiratory:  Negative for cough and shortness of breath.   Cardiovascular:  Negative for chest pain, palpitations and leg swelling.  Gastrointestinal:  Negative for vomiting.  Musculoskeletal:  Negative for back pain.  Skin:  Negative for rash.  Neurological:  Negative for loss of consciousness and headaches.   Objective:   Vitals: body mass index is 29.69 kg/m. Today's Vitals   07/28/24 1446  BP: 110/80  Pulse: 79  SpO2: 99%  Weight: 147 lb (66.7 kg)  Height: 4' 11 (1.499 m)  PainSc: 0-No pain   Physical Exam Vitals and nursing note reviewed. Exam conducted with a chaperone present (Araceli Iola, CMA).  Constitutional:      General: She is not in acute distress.    Appearance: Normal appearance. She is not ill-appearing or toxic-appearing.  HENT:     Head: Normocephalic and atraumatic.     Right Ear: Hearing, tympanic membrane, ear canal and external ear normal.     Left Ear: Hearing, tympanic membrane, ear canal and external ear normal.     Mouth/Throat:     Pharynx: Oropharynx is clear.  Eyes:     Extraocular Movements: Extraocular movements intact.     Pupils: Pupils are equal, round, and reactive to light.  Neck:     Thyroid : No thyroid  mass, thyromegaly or thyroid  tenderness.     Vascular: No carotid bruit.  Cardiovascular:     Rate and Rhythm: Normal rate and regular rhythm. No extrasystoles are present.    Pulses:          Dorsalis pedis pulses are 2+ on the right side and 2+ on the left side.     Heart sounds: Normal heart sounds. No murmur heard.    No friction rub. No gallop.  Pulmonary:     Effort: Pulmonary effort is normal.     Breath sounds: Normal breath sounds. No decreased breath sounds, wheezing, rhonchi or rales.  Chest:     Chest wall: No mass.  Abdominal:     Palpations: Abdomen is soft. There is no hepatomegaly, splenomegaly or mass.     Tenderness: There is no abdominal tenderness.     Hernia: No hernia is present.  Musculoskeletal:     Cervical back: Normal range of motion.     Right lower leg: No edema.     Left lower leg: No edema.  Lymphadenopathy:     Cervical: No cervical adenopathy.     Upper Body:     Right upper body: No supraclavicular adenopathy.     Left upper body: No supraclavicular adenopathy.  Skin:    General: Skin is warm and dry.   Neurological:     General: No focal deficit present.     Mental Status: She is alert and oriented to person, place, and time. Mental status is at baseline.     Sensory: Sensation is intact.     Motor: Motor function is intact. No weakness.     Deep Tendon Reflexes: Reflexes are normal and symmetric.  Psychiatric:        Attention and Perception: Attention normal.        Mood and Affect: Mood normal.        Speech: Speech normal.        Behavior: Behavior normal.        Thought Content: Thought content normal.        Cognition and Memory: Cognition normal.        Judgment: Judgment  normal.     Current Outpatient Medications  Medication Instructions   ALLEGRA-D ALLERGY & CONGESTION 60-120 MG 12 hr tablet TAKE 1 TABLET BY MOUTH TWICE A DAY   atorvastatin  (LIPITOR) 20 mg, Oral, Daily   Calcium  Carb-Cholecalciferol (CALCIUM  600+D) 600-10 MG-MCG TABS 3 times daily before meals & bedtime   Continuous Glucose Sensor (FREESTYLE LIBRE 3 PLUS SENSOR) MISC Use as directed every 15 (fifteen) days.   fluticasone  (FLONASE ) 50 MCG/ACT nasal spray 2 sprays, Each Nare, Daily   glucose blood test strip Use as instructed 1x a day - for One Touch Verio Reflect   Multiple Vitamin (MULTIVITAMIN) tablet 1 tablet, Daily   OneTouch Delica Lancets 33G MISC Use 2x a day - for One Touch Verio Reveal   Ozempic  (2 MG/DOSE) 2 mg, Subcutaneous, Weekly   rOPINIRole  (REQUIP ) 0.5 MG tablet Take 1 tablet (0.5 mg total) by mouth daily in the afternoon AND 2 tablets (1 mg total) at bedtime.   Vitamin D3 5,000 Units, Daily   Past Medical History:  Diagnosis Date   Allergy    Back pain    Complication of anesthesia    low O2 sat   Deviated nasal septum    Discoid lupus    DM (diabetes mellitus) (HCC)    Family history of pancreatic cancer    Family history of prostate cancer    History of discoid lupus erythematosus    Hyperlipidemia    Leg edema    Obesity    OSA (obstructive sleep apnea)    Restless legs  syndrome (RLS)    Sleep apnea    Vitamin D  deficiency    Medical/Surgical History Narrative:  Allergic/Intolerant to:  Allergies  Allergen Reactions   Sulfa Antibiotics Hives   Sulfonamide Derivatives Hives   Past Surgical History:  Procedure Laterality Date   BIOPSY  07/24/2021   Procedure: BIOPSY;  Surgeon: Wilhelmenia Aloha Raddle., MD;  Location: [facility];  Service: Gastroenterology;;   BREAST BIOPSY Right 02/27/2023   MM RT BREAST BX W LOC DEV 1ST LESION IMAGE BX SPEC STEREO GUIDE 02/27/2023 [facility]   COLONOSCOPY WITH PROPOFOL  N/A 07/24/2021   Procedure: COLONOSCOPY WITH PROPOFOL ;  Surgeon: Wilhelmenia Aloha Raddle., MD;  Location: [facility];  Service: Gastroenterology;  Laterality: N/A;   ESOPHAGOGASTRODUODENOSCOPY (EGD) WITH PROPOFOL  N/A 07/24/2021   Procedure: ESOPHAGOGASTRODUODENOSCOPY (EGD) WITH PROPOFOL ;  Surgeon: Wilhelmenia Aloha Raddle., MD;  Location: [facility];  Service: Gastroenterology;  Laterality: N/A;   POLYPECTOMY  07/24/2021   Procedure: POLYPECTOMY;  Surgeon: Wilhelmenia Aloha Raddle., MD;  Location: [facility];  Service: Gastroenterology;;   ROTATOR CUFF REPAIR  2009   left   Sebaceous cyst removal, back  03/27/2008   XI ROBOTIC ASSISTED HIATAL HERNIA REPAIR N/A 11/21/2021   Procedure: XI ROBOTIC ASSISTED HIATAL HERNIA REPAIR;  Surgeon: Lyndel Deward PARAS, MD;  Location: [facility];  Service: General;  Laterality: N/A;   Family History  Problem Relation Age of Onset   Skin cancer Mother    Hyperlipidemia Mother    Hypertension Father    Stroke Father    Heart disease Father    Diabetes Father    Pancreatic cancer Father 56   Prostate cancer Father        dx in his 73s; prostatectomy   Sleep apnea Father    Obesity Father    Cancer Father    Heart attack Father    Pancreatic cancer Sister 22       neuroendocrine tumor   Colon polyps  Sister    Colon polyps Sister    Colon polyps Sister    Congestive Heart Failure Maternal Aunt     Dementia Maternal Uncle    Lung cancer Paternal Aunt    Heart attack Paternal Uncle    Pancreatic cancer Paternal Uncle 71   Testicular cancer Cousin        paternal first cousin dx in his 42s   Colon cancer Neg Hx    Esophageal cancer Neg Hx    Inflammatory bowel disease Neg Hx    Liver disease Neg Hx    Stomach cancer Neg Hx    Rectal cancer Neg Hx    Family History Narrative: Father with history of hypertension, stroke, coronary artery disease, kidney stones and diabetes.  Mother with history of osteoporosis.  No brothers.  4 sisters, 2 of whom have diabetes.  2 sisters have sleep apnea.  3 sisters are overweight.  1 sister is overall in good health.    Social history: Single, never married. Seldom consumes alcohol. Does not smoke. Has a college degree. Has family in [place] . 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 She likes her job.   Most Recent Health Risks Assessment:   Most Recent Social Determinants of Health (Including Hx of Tobacco, Alcohol, and Drug Use) SDOH Screenings   Food Insecurity: No Food Insecurity (07/27/2024)  Housing: Low Risk  (07/27/2024)  Transportation Needs: No Transportation Needs (07/27/2024)  Alcohol Screen: Low Risk  (07/27/2024)  Depression (PHQ2-9): Low Risk  (12/10/2023)  Financial Resource Strain: Low Risk  (07/27/2024)  Physical Activity: Sufficiently Active (07/27/2024)  Social Connections: Moderately Integrated (07/27/2024)  Stress: No Stress Concern Present (07/27/2024)  Tobacco Use: Medium Risk (07/28/2024)  Health Literacy: Adequate Health Literacy (12/10/2023)   Social History   Tobacco Use   Smoking status: Former    Current packs/day: 0.00    Average packs/day: 1 pack/day for 15.0 years (15.0 ttl pk-yrs)    Types: Cigarettes    Start date: 08/28/1983    Quit date: 08/27/1998    Years since quitting: 25.9   Smokeless tobacco: Never  Vaping Use   Vaping  status: Never Used  Substance Use Topics   Alcohol use: Yes    Alcohol/week: 2.0 standard drinks of alcohol    Types: 2 Glasses of wine per week    Comment: Occasionally, I will have a glass of wine in social situations.   Drug use: Never   Social Hx: Single, never married. Seldom consumes alcohol.  Nonsmoker. 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  Family Yk:Qjuyzm with hx of HTN, stroke, CAD, kidney stones DM. Mother with hx of osteoporosis. No brothers. 4 sisters. 2 sisters have DM. 2 sisters with sleep apnea. 3 sisters overweight. 1 sister in overall good health.  Most Recent Fall Risk Assessment:    12/10/2023    3:33 PM  Fall Risk   Falls in the past year? 0  Number falls in past yr: 0  Injury with Fall? 0   Risk for fall due to : No Fall Risks  Follow up Falls evaluation completed     Data saved with a previous flowsheet row definition   Most Recent Anxiety/Depression Screenings:    12/10/2023    3:33 PM 12/25/2022    9:35 AM  PHQ 2/9 Scores  PHQ - 2 Score 0 0      12/10/2023  3:33 PM  GAD 7 : Generalized Anxiety Score  Nervous, Anxious, on Edge 0  Control/stop worrying 0  Worry too much - different things 0  Trouble relaxing 0  Restless 0  Easily annoyed or irritable 0  Afraid - awful might happen 0  Total GAD 7 Score 0    Results:  Studies Obtained And Personally Reviewed By Me:   12/26/2023 Mammogram No mammographic or sonographic abnormality seen in the left axilla at site of patient's pain. Return to routine screening mammogram which will be due in July of 2025.   07/24/2021 Colonoscopy Hemorrhoids found on digital rectal exam. The examined portion of the ileum was normal. Six, 2 to 8 mm polyps in the rectum, at the recto- sigmoid colon and in the cecum. Resected and retrieved. Pathology found to be tubular adenoma and hyperplastic polyp.  Diverticulosis in the recto- sigmoid colon, in the sigmoid colon and  in the descending colon. Normal mucosa in the entire examined colon otherwise. Non- bleeding non- thrombosed external and internal hemorrhoids. Repeat in 5 years    Labs:  CBC w/ Differential Lab Results  Component Value Date   WBC 6.3 07/21/2024   RBC 4.27 07/21/2024   HGB 12.8 07/21/2024   HCT 39.2 07/21/2024   PLT 241 07/21/2024   MCV 91.8 07/21/2024   MCH 30.0 07/21/2024   MCHC 32.7 07/21/2024   RDW 11.8 07/21/2024   MPV 10.5 07/21/2024   LYMPHSABS 2,201 06/14/2022   MONOABS 0.5 11/23/2021   BASOSABS 38 07/21/2024    Comprehensive Metabolic Panel Lab Results  Component Value Date   NA 142 07/21/2024   K 4.9 07/21/2024   CL 105 07/21/2024   CO2 29 07/21/2024   GLUCOSE 83 07/21/2024   BUN 12 07/21/2024   CREATININE 0.71 07/21/2024   CALCIUM  9.3 07/21/2024   PROT 6.8 07/21/2024   ALBUMIN 4.4 11/16/2021   AST 21 07/21/2024   ALT 26 07/21/2024   ALKPHOS 50 11/16/2021   BILITOT 0.5 07/21/2024   EGFR 100 07/21/2024   GFRNONAA >60 11/21/2021   Lipid Panel  Lab Results  Component Value Date   CHOL 112 07/21/2024   HDL 57 07/21/2024   LDLCALC 40 07/21/2024   TRIG 70 07/21/2024   A1c Lab Results  Component Value Date   HGBA1C 5.4 07/21/2024    TSH Lab Results  Component Value Date   TSH 1.70 07/21/2024   Assessment & Plan:   Orders Placed This Encounter  Procedures   POCT URINALYSIS DIP (CLINITEK)    Diabetes, Mellitus Type II: treated with Ozempic  2 mg weekly. 07/21/2024 HgbA1c 5.4%  Hyperlipidemia: treated with Atorvastatin  20 mg daily. 07/21/2024 Lipid panel normal.  Hx of elevated triglycerides and impaired glucose tolerance  Restless legs syndrome: treated with Requip  0.5 mg in the afternoon and 1 mg at bedtime.   Seen by Dr. Jude for sleep apnea in September 2023.  CPAP is set at 9 cm.  Home sleep service is by [facility].   12/26/2023 Mammogram No mammographic or sonographic abnormality seen in the left axilla at site of patient's pain. Return to  routine screening mammogram which will be due in July of 2025.   07/24/2021 Colonoscopy Hemorrhoids found on digital rectal exam. The examined portion of the ileum was normal. Six, 2 to 8 mm polyps in the rectum, at the recto- sigmoid colon and in the cecum. Resected and retrieved. Pathology found to be tubular adenoma and hyperplastic polyp.  Diverticulosis in the recto- sigmoid colon,  in the sigmoid colon and in the descending colon. Normal mucosa in the entire examined colon otherwise. Non- bleeding non- thrombosed external and internal hemorrhoids. Repeat in 5 years    Vaccine counseling: Covid-19 vaccine due. Hep B vaccine discontinued.      Annual Comprehensive Physical Exam done today including the all of the following: Reviewed patient's Family Medical History Reviewed patient's SDOH and reviewed tobacco, alcohol, and drug use.  Reviewed and updated list of patient's medical providers Assessment of cognitive impairment was done Assessed patient's functional ability Established a written schedule for health screening services Health Risk Assessent Completed and Reviewed  Discussed health benefits of physical activity, and encouraged her to engage in regular exercise appropriate for her age and condition.    I,Makayla C Reid,acting as a scribe for Ronal JINNY Hailstone, MD.,have documented all relevant documentation on the behalf of Ronal JINNY Hailstone, MD,as directed by  Ronal JINNY Hailstone, MD while in the presence of Ronal JINNY Hailstone, MD.  I, Ronal JINNY Hailstone, MD, have reviewed all documentation for and agree with the above Annual Wellness Visit documentation.  Ronal JINNY Hailstone, MD Internal Medicine 07/28/2024 "

## 2024-07-21 ENCOUNTER — Other Ambulatory Visit: Payer: Self-pay

## 2024-07-21 DIAGNOSIS — Z0000 Encounter for general adult medical examination without abnormal findings: Secondary | ICD-10-CM

## 2024-07-21 DIAGNOSIS — E119 Type 2 diabetes mellitus without complications: Secondary | ICD-10-CM

## 2024-07-21 DIAGNOSIS — E785 Hyperlipidemia, unspecified: Secondary | ICD-10-CM

## 2024-07-22 LAB — CBC WITH DIFFERENTIAL/PLATELET
Absolute Lymphocytes: 1871 {cells}/uL (ref 850–3900)
Absolute Monocytes: 271 {cells}/uL (ref 200–950)
Basophils Absolute: 38 {cells}/uL (ref 0–200)
Basophils Relative: 0.6 %
Eosinophils Absolute: 101 {cells}/uL (ref 15–500)
Eosinophils Relative: 1.6 %
HCT: 39.2 % (ref 35.9–46.0)
Hemoglobin: 12.8 g/dL (ref 11.7–15.5)
MCH: 30 pg (ref 27.0–33.0)
MCHC: 32.7 g/dL (ref 31.6–35.4)
MCV: 91.8 fL (ref 81.4–101.7)
MPV: 10.5 fL (ref 7.5–12.5)
Monocytes Relative: 4.3 %
Neutro Abs: 4019 {cells}/uL (ref 1500–7800)
Neutrophils Relative %: 63.8 %
Platelets: 241 Thousand/uL (ref 140–400)
RBC: 4.27 Million/uL (ref 3.80–5.10)
RDW: 11.8 % (ref 11.0–15.0)
Total Lymphocyte: 29.7 %
WBC: 6.3 Thousand/uL (ref 3.8–10.8)

## 2024-07-22 LAB — MICROALBUMIN / CREATININE URINE RATIO
Creatinine, Urine: 35 mg/dL (ref 20–275)
Microalb, Ur: <0.2 mg/dL

## 2024-07-22 LAB — COMPREHENSIVE METABOLIC PANEL WITH GFR
AG Ratio: 1.7 (calc) (ref 1.0–2.5)
ALT: 26 U/L (ref 6–29)
AST: 21 U/L (ref 10–35)
Albumin: 4.3 g/dL (ref 3.6–5.1)
Alkaline phosphatase (APISO): 41 U/L (ref 37–153)
BUN: 12 mg/dL (ref 7–25)
CO2: 29 mmol/L (ref 20–32)
Calcium: 9.3 mg/dL (ref 8.6–10.4)
Chloride: 105 mmol/L (ref 98–110)
Creat: 0.71 mg/dL (ref 0.50–1.03)
Globulin: 2.5 g/dL (ref 1.9–3.7)
Glucose, Bld: 83 mg/dL (ref 65–99)
Potassium: 4.9 mmol/L (ref 3.5–5.3)
Sodium: 142 mmol/L (ref 135–146)
Total Bilirubin: 0.5 mg/dL (ref 0.2–1.2)
Total Protein: 6.8 g/dL (ref 6.1–8.1)
eGFR: 100 mL/min/1.73m2 (ref >=60)

## 2024-07-22 LAB — LIPID PANEL
Cholesterol: 112 mg/dL (ref <200)
HDL: 57 mg/dL (ref >=50)
LDL Cholesterol (Calc): 40 mg/dL
Non-HDL Cholesterol (Calc): 55 mg/dL (ref <130)
Total CHOL/HDL Ratio: 2 (calc) (ref <5.0)
Triglycerides: 70 mg/dL (ref <150)

## 2024-07-22 LAB — HEMOGLOBIN A1C
Hgb A1c MFr Bld: 5.4 % (ref <5.7)
Mean Plasma Glucose: 108 mg/dL
eAG (mmol/L): 6 mmol/L

## 2024-07-22 LAB — TSH: TSH: 1.7 m[IU]/L (ref 0.40–4.50)

## 2024-07-23 ENCOUNTER — Ambulatory Visit: Payer: Self-pay | Admitting: Internal Medicine

## 2024-07-28 ENCOUNTER — Ambulatory Visit: Payer: Self-pay | Admitting: Internal Medicine

## 2024-07-28 ENCOUNTER — Other Ambulatory Visit (HOSPITAL_COMMUNITY)
Admission: RE | Admit: 2024-07-28 | Discharge: 2024-07-28 | Disposition: A | Discharge status: Home / Self Care | Source: Ambulatory Visit | Attending: Internal Medicine | Admitting: Internal Medicine

## 2024-07-28 ENCOUNTER — Encounter: Payer: Self-pay | Admitting: Internal Medicine

## 2024-07-28 VITALS — BP 110/80 | HR 79 | Ht 59.0 in | Wt 147.0 lb

## 2024-07-28 DIAGNOSIS — K219 Gastro-esophageal reflux disease without esophagitis: Secondary | ICD-10-CM | POA: Diagnosis not present

## 2024-07-28 DIAGNOSIS — Z9889 Other specified postprocedural states: Secondary | ICD-10-CM

## 2024-07-28 DIAGNOSIS — R931 Abnormal findings on diagnostic imaging of heart and coronary circulation: Secondary | ICD-10-CM

## 2024-07-28 DIAGNOSIS — E119 Type 2 diabetes mellitus without complications: Secondary | ICD-10-CM

## 2024-07-28 DIAGNOSIS — Z124 Encounter for screening for malignant neoplasm of cervix: Secondary | ICD-10-CM

## 2024-07-28 DIAGNOSIS — Z8639 Personal history of other endocrine, nutritional and metabolic disease: Secondary | ICD-10-CM | POA: Diagnosis not present

## 2024-07-28 DIAGNOSIS — Z0000 Encounter for general adult medical examination without abnormal findings: Secondary | ICD-10-CM

## 2024-07-28 DIAGNOSIS — G2581 Restless legs syndrome: Secondary | ICD-10-CM

## 2024-07-28 DIAGNOSIS — Z6829 Body mass index (BMI) 29.0-29.9, adult: Secondary | ICD-10-CM

## 2024-07-28 DIAGNOSIS — Z8249 Family history of ischemic heart disease and other diseases of the circulatory system: Secondary | ICD-10-CM

## 2024-07-28 DIAGNOSIS — K5901 Slow transit constipation: Secondary | ICD-10-CM

## 2024-07-28 DIAGNOSIS — Z7985 Long-term (current) use of injectable non-insulin antidiabetic drugs: Secondary | ICD-10-CM | POA: Diagnosis not present

## 2024-07-28 DIAGNOSIS — E786 Lipoprotein deficiency: Secondary | ICD-10-CM

## 2024-07-28 LAB — POCT URINALYSIS DIP (CLINITEK)
Bilirubin, UA: NEGATIVE
Blood, UA: NEGATIVE
Glucose, UA: NEGATIVE mg/dL
Ketones, POC UA: NEGATIVE mg/dL
Leukocytes, UA: NEGATIVE
Nitrite, UA: NEGATIVE
POC PROTEIN,UA: NEGATIVE
Spec Grav, UA: 1.02 (ref 1.010–1.025)
Urobilinogen, UA: 0.2 U/dL
pH, UA: 6 (ref 5.0–8.0)

## 2024-07-30 ENCOUNTER — Ambulatory Visit: Payer: Self-pay | Admitting: Internal Medicine

## 2024-07-30 LAB — CYTOLOGY - PAP
Comment: NEGATIVE
Diagnosis: NEGATIVE
High risk HPV: NEGATIVE

## 2024-08-20 ENCOUNTER — Other Ambulatory Visit: Payer: Self-pay | Admitting: Internal Medicine

## 2024-08-20 DIAGNOSIS — J00 Acute nasopharyngitis [common cold]: Secondary | ICD-10-CM

## 2024-08-20 NOTE — Patient Instructions (Signed)
 As always , it was a pleasure to see you today. Return in one year or as needed. Your labs are stable. Continue diet and exercise regimen. No change in medications.

## 2024-08-21 ENCOUNTER — Other Ambulatory Visit (HOSPITAL_COMMUNITY): Payer: Self-pay

## 2024-08-21 ENCOUNTER — Other Ambulatory Visit: Payer: Self-pay

## 2024-08-24 ENCOUNTER — Other Ambulatory Visit (HOSPITAL_COMMUNITY): Payer: Self-pay

## 2024-08-24 ENCOUNTER — Other Ambulatory Visit: Payer: Self-pay

## 2024-08-24 MED ORDER — ROPINIROLE HCL 0.5 MG PO TABS
ORAL_TABLET | ORAL | 1 refills | Status: AC
  Filled 2024-08-24: qty 270, 90d supply, fill #0

## 2024-08-24 MED ORDER — FLUTICASONE PROPIONATE 50 MCG/ACT NA SUSP
2.0000 | Freq: Every day | NASAL | 99 refills | Status: AC
  Filled 2024-08-24: qty 16, 30d supply, fill #0

## 2024-09-10 ENCOUNTER — Encounter: Payer: Self-pay | Admitting: Internal Medicine

## 2024-09-10 ENCOUNTER — Ambulatory Visit: Admitting: Internal Medicine

## 2024-09-10 VITALS — BP 112/60 | HR 74 | Ht 59.0 in | Wt 144.0 lb

## 2024-09-10 DIAGNOSIS — E663 Overweight: Secondary | ICD-10-CM | POA: Diagnosis not present

## 2024-09-10 DIAGNOSIS — E785 Hyperlipidemia, unspecified: Secondary | ICD-10-CM

## 2024-09-10 DIAGNOSIS — E1165 Type 2 diabetes mellitus with hyperglycemia: Secondary | ICD-10-CM

## 2024-09-10 DIAGNOSIS — Z6829 Body mass index (BMI) 29.0-29.9, adult: Secondary | ICD-10-CM

## 2024-09-10 DIAGNOSIS — Z7985 Long-term (current) use of injectable non-insulin antidiabetic drugs: Secondary | ICD-10-CM | POA: Diagnosis not present

## 2024-09-10 MED ORDER — FREESTYLE LIBRE 3 PLUS SENSOR MISC: 1.0000 | 3 refills | Status: AC

## 2024-09-10 NOTE — Progress Notes (Signed)
 Patient ID: Connie Hall, female   DOB: [birth year], 59 y.o.   MRN: [id number]  HPI: Connie Hall is a 59 y.o.-year-old female, referred by her PCP, Dr. Perri, for management of DM2, dx at least in 2012, without long-term complications and also obesity.  Last visit 6 months ago.  Interim history: She had gastric bypass (R en Y) surgery In 10/2021. She lost >100 pounds afterwards No increased urination, blurry vision, nausea, chest pain.   She feels great, has good energy.  She continues doing Pilates (she started 3 years ago) 1 hour sessions, and she also exercises at home.  Reviewed history: She was seen in the weight management clinic by Dr. Verdon. Also tried Weight watchers, Optivia, other meal-replacement plans, w/o good results. She gained ~50 lbs in 5 years.  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  Reviewed HbA1c levels: Lab Results  Component Value Date   HGBA1C 5.4 07/21/2024   HGBA1C 5.4 01/14/2024   HGBA1C 5.5 07/18/2023   HGBA1C 5.6 12/20/2022   HGBA1C 5.4 09/06/2022   HGBA1C 5.7 (H) 06/14/2022   HGBA1C 5.3 03/01/2022   HGBA1C 6.1 (H) 11/16/2021   HGBA1C 5.8 (H) 06/06/2021   HGBA1C 6.2 (A) 01/26/2021   HGBA1C 7.1 (A) 09/27/2020   HGBA1C 6.5 (H) 05/13/2020   HGBA1C 7.0 (A) 02/05/2020   HGBA1C 6.7 (H) 04/21/2019   HGBA1C 6.4 (H) 09/09/2018   HGBA1C 5.8 (H) 03/11/2018   HGBA1C 6.2 (H) 12/25/2017   HGBA1C 6.0 (H) 08/08/2017   HGBA1C 6.1 (H) 02/07/2017   HGBA1C 6.1 (H) 07/26/2016   She is on: - Ozempic  0.25 >> 0.5 >> 1 >> 2 >> >> 2 mg weekly She was previously on metformin , stopped 02/2022. She was off Ozempic  for a period of time but her HbA1c increased and she restarted it.  Pt was checking her sugars  times a week with her CGM-expensive. Now not checking blood sugars since last visit.  Previously:  Previously: - am:  70s-100 - 2h after b'fast: n/c - lunch: n/c - 2h after lunch: 120-150 - dinner: n/c - 2h after dinner: 120-150 -  bedtime: n/c  Previously:   Lowest sugar was 60s >> 40 >> ?; it is unclear at which level she has hypoglycemia awareness. Highest sugar was 200s >> 190s (doughnut) >> ?  -No CKD, last BUN/creatinine:  Lab Results  Component Value Date   BUN 12 07/21/2024   BUN 11 07/18/2023   CREATININE 0.71 07/21/2024   CREATININE 0.66 07/18/2023   Lab Results  Component Value Date   MICRALBCREAT NOTE 07/21/2024   MICRALBCREAT 2 01/21/2024   MICRALBCREAT 12 12/20/2022   MICRALBCREAT 4 06/06/2021   MICRALBCREAT 9 05/13/2020   MICRALBCREAT 8 09/09/2018   MICRALBCREAT 7 03/11/2018   MICRALBCREAT 3 08/08/2017   MICRALBCREAT 12 02/07/2017   MICRALBCREAT 7.1 01/20/2015  She is not on ACE inhibitor or ARB.  -+ Dyslipidemia; last set of lipids: Lab Results  Component Value Date   CHOL 112 07/21/2024   HDL 57 07/21/2024   LDLCALC 40 07/21/2024   TRIG 70 07/21/2024   CHOLHDL 2.0 07/21/2024  On Lipitor 10.  - last eye exam was on 12/31/2023: No DR. She has a history of retinal holes. Dr. Kennyth.  - she denies numbness and tingling in her feet. Last diabetic foot exam 03/05/2024 here in clinic. She previously saw podiatry (Dr. Verta) but a diabetic foot exam was not done at that time. She had capsulitis of the first metatarsophalangeal  joint of the left foot and had a steroid injection.  She was advised to return to podiatry prn.  Pt has FH of DM in father (died of pancreatic cancer), 4 sisters. No h/o pancreatitis or FH of MTC.  She has a history of OSA.  ROS: + see HPI  I reviewed pt's medications, allergies, PMH, social hx, family hx, and changes were documented in the history of present illness. Otherwise, unchanged from my initial visit note.  Past Medical History:  Diagnosis Date   Allergy    Back pain    Complication of anesthesia    low O2 sat   Deviated nasal septum    Discoid lupus    DM (diabetes mellitus) (HCC)    Family history of pancreatic cancer    Family history of  prostate cancer    History of discoid lupus erythematosus    Hyperlipidemia    Leg edema    Obesity    OSA (obstructive sleep apnea)    Restless legs syndrome (RLS)    Sleep apnea    Vitamin D  deficiency    Past Surgical History:  Procedure Laterality Date   BIOPSY  07/24/2021   Procedure: BIOPSY;  Surgeon: Wilhelmenia Aloha Raddle., MD;  Location: [facility];  Service: Gastroenterology;;   BREAST BIOPSY Right 02/27/2023   MM RT BREAST BX W LOC DEV 1ST LESION IMAGE BX SPEC STEREO GUIDE 02/27/2023 [facility]   COLONOSCOPY WITH PROPOFOL  N/A 07/24/2021   Procedure: COLONOSCOPY WITH PROPOFOL ;  Surgeon: Wilhelmenia Aloha Raddle., MD;  Location: [facility];  Service: Gastroenterology;  Laterality: N/A;   ESOPHAGOGASTRODUODENOSCOPY (EGD) WITH PROPOFOL  N/A 07/24/2021   Procedure: ESOPHAGOGASTRODUODENOSCOPY (EGD) WITH PROPOFOL ;  Surgeon: Wilhelmenia Aloha Raddle., MD;  Location: [facility];  Service: Gastroenterology;  Laterality: N/A;   POLYPECTOMY  07/24/2021   Procedure: POLYPECTOMY;  Surgeon: Wilhelmenia Aloha Raddle., MD;  Location: [facility];  Service: Gastroenterology;;   ROTATOR CUFF REPAIR  2009   left   Sebaceous cyst removal, back  03/27/2008   XI ROBOTIC ASSISTED HIATAL HERNIA REPAIR N/A 11/21/2021   Procedure: XI ROBOTIC ASSISTED HIATAL HERNIA REPAIR;  Surgeon: Lyndel Deward PARAS, MD;  Location: [facility];  Service: General;  Laterality: N/A;   Social History   Socioeconomic History   Marital status: Single    Spouse name: Not on file   Number of children: 0   Years of education: Not on file   Highest education level: Bachelor's degree (e.g., BA, AB, BS)  Occupational History   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    Employer: [employer]  Tobacco Use   Smoking status: Former    Current packs/day: 0.00    Average packs/day: 1 pack/day for 15.0 years (15.0 ttl pk-yrs)    Types: Cigarettes    Start date: 08/28/1983    Quit date: 08/27/1998    Years since quitting: 26.0    Smokeless tobacco: Never  Vaping Use   Vaping status: Never Used  Substance and Sexual Activity   Alcohol use: Yes    Alcohol/week: 2.0 standard drinks of alcohol    Types: 2 Glasses of wine per week    Comment: Occasionally, I will have a glass of wine in social situations.   Drug use: Never   Sexual activity: Not Currently    Birth control/protection: None  Other Topics Concern   Not on file  Social History Narrative   Not on file   Social Drivers of Health   Tobacco Use: Medium Risk (07/28/2024)  Patient History    Smoking Tobacco Use: Former    Smokeless Tobacco Use: Never    Passive Exposure: Not on file  Financial Resource Strain: Low Risk (07/27/2024)   Overall Financial Resource Strain (CARDIA)    Difficulty of Paying Living Expenses: Not hard at all  Food Insecurity: No Food Insecurity (07/27/2024)   Epic    Worried About Programme Researcher, Broadcasting/film/video in the Last Year: Never true    Ran Out of Food in the Last Year: Never true  Transportation Needs: No Transportation Needs (07/27/2024)   Epic    Lack of Transportation (Medical): No    Lack of Transportation (Non-Medical): No  Physical Activity: Sufficiently Active (07/27/2024)   Exercise Vital Sign    Days of Exercise per Week: 4 days    Minutes of Exercise per Session: 40 min  Stress: No Stress Concern Present (07/27/2024)   [employer] of Occupational Health - Occupational Stress Questionnaire    Feeling of Stress: Not at all  Social Connections: Moderately Integrated (07/27/2024)   Social Connection and Isolation Panel    Frequency of Communication with Friends and Family: More than three times a week    Frequency of Social Gatherings with Friends and Family: More than three times a week    Attends Religious Services: More than 4 times per year    Active Member of Clubs or Organizations: Yes    Attends Banker Meetings: More than 4 times per year    Marital Status: Never married  Intimate Partner  Violence: Not on file  Depression (PHQ2-9): Low Risk (12/10/2023)   Depression (PHQ2-9)    PHQ-2 Score: 0  Alcohol Screen: Low Risk (07/27/2024)   Alcohol Screen    Last Alcohol Screening Score (AUDIT): 2  Housing: Low Risk (07/27/2024)   Epic    Unable to Pay for Housing in the Last Year: No    Number of Times Moved in the Last Year: 0    Homeless in the Last Year: No  Utilities: Not on file  Health Literacy: Adequate Health Literacy (12/10/2023)   B1300 Health Literacy    Frequency of need for help with medical instructions: Never   Current Outpatient Medications on File Prior to Visit  Medication Sig Dispense Refill   ALLEGRA-D ALLERGY & CONGESTION 60-120 MG 12 hr tablet TAKE 1 TABLET BY MOUTH TWICE A DAY 180 tablet 2   atorvastatin  (LIPITOR) 20 MG tablet Take 1 tablet (20 mg total) by mouth daily. 90 tablet 3   Calcium  Carb-Cholecalciferol (CALCIUM  600+D) 600-10 MG-MCG TABS Take by mouth 4 (four) times daily -  before meals and at bedtime.     Cholecalciferol (VITAMIN D3) 125 MCG (5000 UT) CAPS Take 5,000 Units by mouth daily.     Continuous Glucose Sensor (FREESTYLE LIBRE 3 PLUS SENSOR) MISC Use as directed every 15 (fifteen) days. 6 each 3   fluticasone  (FLONASE ) 50 MCG/ACT nasal spray Place 2 sprays into both nostrils daily. 16 g PRN   glucose blood test strip Use as instructed 1x a day - for One Touch Verio Reflect 100 each 3   Multiple Vitamin (MULTIVITAMIN) tablet Take 1 tablet by mouth daily.     OneTouch Delica Lancets 33G MISC Use 2x a day - for One Touch Verio Reveal 100 each 3   rOPINIRole  (REQUIP ) 0.5 MG tablet Take 1 tablet (0.5 mg total) by mouth daily in the afternoon AND 2 tablets (1 mg total) at bedtime. 270 tablet 1  Semaglutide , 2 MG/DOSE, (OZEMPIC , 2 MG/DOSE,) 8 MG/3ML SOPN Inject 2 mg into the skin once a week. 9 mL 3   No current facility-administered medications on file prior to visit.   Allergies  Allergen Reactions   Sulfa Antibiotics Hives   Sulfonamide  Derivatives Hives   Family History  Problem Relation Age of Onset   Skin cancer Mother    Hyperlipidemia Mother    Hypertension Father    Stroke Father    Heart disease Father    Diabetes Father    Pancreatic cancer Father 53   Prostate cancer Father        dx in his 8s; prostatectomy   Sleep apnea Father    Obesity Father    Cancer Father    Heart attack Father    Pancreatic cancer Sister 37       neuroendocrine tumor   Colon polyps Sister    Colon polyps Sister    Colon polyps Sister    Congestive Heart Failure Maternal Aunt    Dementia Maternal Uncle    Lung cancer Paternal Aunt    Heart attack Paternal Uncle    Pancreatic cancer Paternal Uncle 30   Testicular cancer Cousin        paternal first cousin dx in his 66s   Colon cancer Neg Hx    Esophageal cancer Neg Hx    Inflammatory bowel disease Neg Hx    Liver disease Neg Hx    Stomach cancer Neg Hx    Rectal cancer Neg Hx    PE: BP 112/60   Pulse 74   Ht 4' 11 (1.499 m)   Wt 144 lb (65.3 kg)   LMP 02/08/2021   SpO2 98%   BMI 29.08 kg/m  Wt Readings from Last 3 Encounters:  09/10/24 144 lb (65.3 kg)  07/28/24 147 lb (66.7 kg)  03/16/24 143 lb (64.9 kg)   Constitutional: overweight, in NAD Eyes: EOMI, no exophthalmos ENT: no thyromegaly, no cervical lymphadenopathy Cardiovascular: RRR, No MRG, + pitting LE edema B Respiratory: CTA B Musculoskeletal: no deformities Skin: no rashes Neurological: no tremor with outstretched hands   ASSESSMENT: 1.  Type 2 diabetes, non-insulin -dependent,  without complications  2.  Overweight  3. HL  PLAN:  1. Patient with history of prediabetes with a new diagnosis of diabetes in 2021, when HbA1c returned 7.0%.  She continues on a weekly GLP-1 receptor agonist, with excellent blood sugars.  At last visit, HbA1c was excellent, at 5.4% and she had another HbA1c obtained 1.5 months ago which was stable.  Reviewing the CGM tracings at last visit, sugars were mainly  fluctuating within the target range but they were quite low between meals and she had some hyperglycemic spikes after certain meals, particularly those with high glycemic index.  We discussed about the importance of stopping high glycemic index meals, and also other processes like crackers and chips but I also recommended to decrease the dose of Ozempic  slightly, to 1.5 mg weekly.  I advised her how to dial this on the 2 mg Ozempic  pen.  I did advise her to try this for a month and then, if doing well, to decrease the dose further, to 1 mg weekly.  She agreed with this plan. -At today's visit, she mentions that she was not able to count the clicks accurately and she actually continued the 2 mg Ozempic  dose.  However, her weight plateaued since last visit and HbA1c was also stable, as mentioned above, so  for now, we can continue the same dose.  However, she is not  checking sugars at all now, as her sensor was expensive and she did not start checking with her glucometer.  I tried to send another prescription for the CGM to her pharmacy, but I did advise her that if this is not covered, to start checking once a day or every other day, rotating check times. - I suggested to:  Patient Instructions  Please continue: - Ozempic  2 mg weekly   Try again to start the sensor.  Please return in 6 months (before 03/05/2025).  - advised to check sugars at different times of the day - 4x a day, rotating check times - advised for yearly eye exams >> she is UTD - return to clinic in 6 months  2.  Overweight - She continues on Ozempic  - Target weight 140 pounds - She initially lost 60 pounds after her gastric bypass surgery and then lost ~40 more afterwards.  Before last visit, she lost another 30 pounds.  At that point, we started to decrease the Ozempic  dose, however, she continues on the 2 mg dose, with stable weight at today's visit.  3. HL - Latest lipid panel was excellent: Lab Results  Component Value  Date   CHOL 112 07/21/2024   HDL 57 07/21/2024   LDLCALC 40 07/21/2024   TRIG 70 07/21/2024   CHOLHDL 2.0 07/21/2024  -She continues on Lipitor 20 mg daily without side effects  Lela Fendt, MD PhD [facility]

## 2024-09-10 NOTE — Patient Instructions (Addendum)
 Please continue: - Ozempic  2 mg weekly   Try again to start the sensor.  Please return in 6 months (before 03/05/2025).

## 2025-03-09 ENCOUNTER — Ambulatory Visit: Admitting: Internal Medicine

## 2025-08-03 ENCOUNTER — Other Ambulatory Visit

## 2025-08-10 ENCOUNTER — Encounter: Admitting: Internal Medicine
